# Patient Record
Sex: Male | Born: 1945 | Race: Black or African American | Hispanic: No | Smoking: Former smoker
Health system: Southern US, Community
[De-identification: ages and names within clinical notes are randomized; demographics above are authoritative.]

## PROBLEM LIST (undated history)

## (undated) DIAGNOSIS — J449 Chronic obstructive pulmonary disease, unspecified: Secondary | ICD-10-CM

## (undated) DIAGNOSIS — C61 Malignant neoplasm of prostate: Secondary | ICD-10-CM

## (undated) DIAGNOSIS — I509 Heart failure, unspecified: Secondary | ICD-10-CM

## (undated) DIAGNOSIS — N200 Calculus of kidney: Secondary | ICD-10-CM

## (undated) DIAGNOSIS — F32A Depression, unspecified: Secondary | ICD-10-CM

## (undated) DIAGNOSIS — I639 Cerebral infarction, unspecified: Secondary | ICD-10-CM

## (undated) DIAGNOSIS — R51 Headache: Secondary | ICD-10-CM

## (undated) DIAGNOSIS — C3490 Malignant neoplasm of unspecified part of unspecified bronchus or lung: Secondary | ICD-10-CM

## (undated) DIAGNOSIS — H409 Unspecified glaucoma: Secondary | ICD-10-CM

## (undated) DIAGNOSIS — F419 Anxiety disorder, unspecified: Secondary | ICD-10-CM

## (undated) DIAGNOSIS — F1020 Alcohol dependence, uncomplicated: Secondary | ICD-10-CM

## (undated) DIAGNOSIS — F329 Major depressive disorder, single episode, unspecified: Secondary | ICD-10-CM

## (undated) DIAGNOSIS — Z87442 Personal history of urinary calculi: Secondary | ICD-10-CM

## (undated) DIAGNOSIS — G8929 Other chronic pain: Secondary | ICD-10-CM

## (undated) DIAGNOSIS — Z972 Presence of dental prosthetic device (complete) (partial): Secondary | ICD-10-CM

## (undated) DIAGNOSIS — M1990 Unspecified osteoarthritis, unspecified site: Secondary | ICD-10-CM

## (undated) DIAGNOSIS — I10 Essential (primary) hypertension: Secondary | ICD-10-CM

## (undated) DIAGNOSIS — K219 Gastro-esophageal reflux disease without esophagitis: Secondary | ICD-10-CM

## (undated) DIAGNOSIS — J189 Pneumonia, unspecified organism: Secondary | ICD-10-CM

## (undated) DIAGNOSIS — Z973 Presence of spectacles and contact lenses: Secondary | ICD-10-CM

## (undated) HISTORY — DX: Calculus of kidney: N20.0

## (undated) HISTORY — DX: Major depressive disorder, single episode, unspecified: F32.9

## (undated) HISTORY — DX: Anxiety disorder, unspecified: F41.9

## (undated) HISTORY — DX: Depression, unspecified: F32.A

## (undated) HISTORY — DX: Unspecified glaucoma: H40.9

## (undated) HISTORY — DX: Chronic obstructive pulmonary disease, unspecified: J44.9

## (undated) HISTORY — DX: Alcohol dependence, uncomplicated: F10.20

## (undated) HISTORY — DX: Pneumonia, unspecified organism: J18.9

## (undated) HISTORY — DX: Other chronic pain: G89.29

## (undated) HISTORY — PX: LUMBAR DISC SURGERY: SHX700

## (undated) HISTORY — PX: CERVICAL DISC SURGERY: SHX588

## (undated) HISTORY — DX: Heart failure, unspecified: I50.9

## (undated) HISTORY — DX: Headache: R51

## (undated) SURGERY — COLONOSCOPY WITH PROPOFOL
Anesthesia: Monitor Anesthesia Care

---

## 1998-12-01 ENCOUNTER — Encounter: Payer: Self-pay | Admitting: Emergency Medicine

## 1998-12-01 ENCOUNTER — Emergency Department (HOSPITAL_COMMUNITY): Admission: EM | Admit: 1998-12-01 | Discharge: 1998-12-01 | Payer: Self-pay | Admitting: Emergency Medicine

## 2001-11-08 ENCOUNTER — Encounter: Payer: Self-pay | Admitting: Emergency Medicine

## 2001-11-08 ENCOUNTER — Emergency Department (HOSPITAL_COMMUNITY): Admission: EM | Admit: 2001-11-08 | Discharge: 2001-11-08 | Payer: Self-pay | Admitting: Emergency Medicine

## 2001-11-09 ENCOUNTER — Encounter: Payer: Self-pay | Admitting: Emergency Medicine

## 2002-10-02 ENCOUNTER — Emergency Department (HOSPITAL_COMMUNITY): Admission: EM | Admit: 2002-10-02 | Discharge: 2002-10-02 | Payer: Self-pay | Admitting: Emergency Medicine

## 2003-08-21 ENCOUNTER — Emergency Department (HOSPITAL_COMMUNITY): Admission: EM | Admit: 2003-08-21 | Discharge: 2003-08-22 | Payer: Self-pay | Admitting: Emergency Medicine

## 2003-09-01 ENCOUNTER — Ambulatory Visit (HOSPITAL_COMMUNITY): Admission: RE | Admit: 2003-09-01 | Discharge: 2003-09-01 | Payer: Self-pay | Admitting: Urology

## 2004-03-11 ENCOUNTER — Emergency Department (HOSPITAL_COMMUNITY): Admission: EM | Admit: 2004-03-11 | Discharge: 2004-03-12 | Payer: Self-pay | Admitting: *Deleted

## 2004-03-24 ENCOUNTER — Encounter: Admission: RE | Admit: 2004-03-24 | Discharge: 2004-03-24 | Payer: Self-pay | Admitting: Orthopaedic Surgery

## 2004-04-11 ENCOUNTER — Encounter: Admission: RE | Admit: 2004-04-11 | Discharge: 2004-04-11 | Payer: Self-pay | Admitting: Orthopaedic Surgery

## 2004-04-18 ENCOUNTER — Ambulatory Visit (HOSPITAL_COMMUNITY): Admission: RE | Admit: 2004-04-18 | Discharge: 2004-04-19 | Payer: Self-pay | Admitting: Orthopaedic Surgery

## 2004-10-24 ENCOUNTER — Ambulatory Visit (HOSPITAL_COMMUNITY): Admission: RE | Admit: 2004-10-24 | Discharge: 2004-10-25 | Payer: Self-pay | Admitting: Orthopaedic Surgery

## 2006-01-18 ENCOUNTER — Emergency Department (HOSPITAL_COMMUNITY): Admission: EM | Admit: 2006-01-18 | Discharge: 2006-01-18 | Payer: Self-pay | Admitting: Emergency Medicine

## 2006-01-28 ENCOUNTER — Emergency Department (HOSPITAL_COMMUNITY): Admission: EM | Admit: 2006-01-28 | Discharge: 2006-01-28 | Payer: Self-pay | Admitting: Emergency Medicine

## 2007-06-08 ENCOUNTER — Emergency Department (HOSPITAL_COMMUNITY): Admission: EM | Admit: 2007-06-08 | Discharge: 2007-06-08 | Payer: Self-pay | Admitting: Emergency Medicine

## 2007-12-14 ENCOUNTER — Emergency Department (HOSPITAL_COMMUNITY): Admission: EM | Admit: 2007-12-14 | Discharge: 2007-12-14 | Payer: Self-pay | Admitting: Emergency Medicine

## 2010-05-23 ENCOUNTER — Inpatient Hospital Stay (HOSPITAL_COMMUNITY)
Admission: EM | Admit: 2010-05-23 | Discharge: 2010-05-26 | DRG: 065 | Disposition: A | Discharge status: Home / Self Care | Payer: Medicare Other | Attending: Internal Medicine | Admitting: Internal Medicine

## 2010-05-23 ENCOUNTER — Emergency Department (HOSPITAL_COMMUNITY): Payer: Medicare Other

## 2010-05-23 DIAGNOSIS — F319 Bipolar disorder, unspecified: Secondary | ICD-10-CM | POA: Diagnosis present

## 2010-05-23 DIAGNOSIS — R079 Chest pain, unspecified: Secondary | ICD-10-CM | POA: Diagnosis present

## 2010-05-23 DIAGNOSIS — N179 Acute kidney failure, unspecified: Secondary | ICD-10-CM | POA: Diagnosis present

## 2010-05-23 DIAGNOSIS — I6350 Cerebral infarction due to unspecified occlusion or stenosis of unspecified cerebral artery: Principal | ICD-10-CM | POA: Diagnosis present

## 2010-05-23 DIAGNOSIS — F1010 Alcohol abuse, uncomplicated: Secondary | ICD-10-CM | POA: Diagnosis present

## 2010-05-23 DIAGNOSIS — F17200 Nicotine dependence, unspecified, uncomplicated: Secondary | ICD-10-CM | POA: Diagnosis present

## 2010-05-23 DIAGNOSIS — N390 Urinary tract infection, site not specified: Secondary | ICD-10-CM | POA: Diagnosis present

## 2010-05-23 DIAGNOSIS — I10 Essential (primary) hypertension: Secondary | ICD-10-CM | POA: Diagnosis present

## 2010-05-23 LAB — DIFFERENTIAL
Eosinophils Absolute: 0.2 10*3/uL (ref 0.0–0.7)
Eosinophils Relative: 3 % (ref 0–5)
Lymphocytes Relative: 35 % (ref 12–46)
Monocytes Relative: 5 % (ref 3–12)
Neutro Abs: 4.5 10*3/uL (ref 1.7–7.7)
Neutrophils Relative %: 56 % (ref 43–77)

## 2010-05-23 LAB — URINE MICROSCOPIC-ADD ON

## 2010-05-23 LAB — COMPREHENSIVE METABOLIC PANEL
ALT: 21 U/L (ref 0–53)
CO2: 26 mEq/L (ref 19–32)
Calcium: 9.6 mg/dL (ref 8.4–10.5)
Chloride: 100 mEq/L (ref 96–112)
Total Bilirubin: 0.5 mg/dL (ref 0.3–1.2)

## 2010-05-23 LAB — URINALYSIS, ROUTINE W REFLEX MICROSCOPIC
Hgb urine dipstick: NEGATIVE
Ketones, ur: NEGATIVE mg/dL
Nitrite: NEGATIVE
Protein, ur: NEGATIVE mg/dL
Specific Gravity, Urine: 1.016 (ref 1.005–1.030)
pH: 6 (ref 5.0–8.0)

## 2010-05-23 LAB — CBC
HCT: 41.1 % (ref 39.0–52.0)
MCH: 32.1 pg (ref 26.0–34.0)
MCV: 92.2 fL (ref 78.0–100.0)
RDW: 13 % (ref 11.5–15.5)
WBC: 8 10*3/uL (ref 4.0–10.5)

## 2010-05-24 ENCOUNTER — Inpatient Hospital Stay (HOSPITAL_COMMUNITY): Payer: Medicare Other

## 2010-05-24 DIAGNOSIS — I6789 Other cerebrovascular disease: Secondary | ICD-10-CM

## 2010-05-24 LAB — TROPONIN I: Troponin I: <0.3 ng/mL (ref <0.30)

## 2010-05-24 LAB — COMPREHENSIVE METABOLIC PANEL
ALT: 18 U/L (ref 0–53)
CO2: 25 mEq/L (ref 19–32)
Calcium: 9.6 mg/dL (ref 8.4–10.5)
Chloride: 102 mEq/L (ref 96–112)
Creatinine, Ser: 1.04 mg/dL (ref 0.4–1.5)
GFR calc non Af Amer: >60 mL/min (ref >60)
Glucose, Bld: 98 mg/dL (ref 70–99)
Sodium: 137 mEq/L (ref 135–145)
Total Bilirubin: 0.4 mg/dL (ref 0.3–1.2)

## 2010-05-24 LAB — CBC
HCT: 40.4 % (ref 39.0–52.0)
Hemoglobin: 13.6 g/dL (ref 13.0–17.0)
MCHC: 33.7 g/dL (ref 30.0–36.0)
MCV: 91.8 fL (ref 78.0–100.0)
RDW: 12.9 % (ref 11.5–15.5)

## 2010-05-24 LAB — CARDIAC PANEL(CRET KIN+CKTOT+MB+TROPI)
CK, MB: 2.8 ng/mL (ref 0.3–4.0)
Relative Index: 1.4 (ref 0.0–2.5)
Total CK: 195 U/L (ref 7–232)
Troponin I: <0.3 ng/mL (ref <0.30)

## 2010-05-24 LAB — PROTIME-INR: INR: 0.95 (ref 0.00–1.49)

## 2010-05-24 LAB — HEMOGLOBIN A1C: Hgb A1c MFr Bld: 5.1 % (ref <5.7)

## 2010-05-24 LAB — RAPID URINE DRUG SCREEN, HOSP PERFORMED
Cocaine: NOT DETECTED
Opiates: NOT DETECTED

## 2010-05-24 LAB — LIPID PANEL
HDL: 38 mg/dL — ABNORMAL LOW (ref >39)
LDL Cholesterol: 53 mg/dL (ref 0–99)

## 2010-05-24 LAB — CK TOTAL AND CKMB (NOT AT ARMC): Total CK: 224 U/L (ref 7–232)

## 2010-05-24 LAB — APTT: aPTT: 34 seconds (ref 24–37)

## 2010-05-24 LAB — MAGNESIUM: Magnesium: 2.1 mg/dL (ref 1.5–2.5)
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
## 2010-05-25 ENCOUNTER — Inpatient Hospital Stay (HOSPITAL_COMMUNITY): Payer: Medicare Other

## 2010-05-25 DIAGNOSIS — I6330 Cerebral infarction due to thrombosis of unspecified cerebral artery: Secondary | ICD-10-CM

## 2010-05-25 NOTE — Op Note (Signed)
NAMETALIESIN, HARTLAGE NO.:  [id number]   MEDICAL RECORD NO.:  [id number]          PATIENT TYPE:  AMB   LOCATION:  SDS                          FACILITY:  [facility]   PHYSICIAN:  Mark C. Ophelia Charter, M.D.    DATE OF BIRTH:  10/10/1945   DATE OF PROCEDURE:  DATE OF DISCHARGE:                                 OPERATIVE REPORT   Audio too short to transcribe (less than 5 seconds)      Mark C. Ophelia Charter, M.D.     MCY/MEDQ  D:  10/24/2004  T:  10/24/2004  Job:  [id number]

## 2010-05-25 NOTE — Op Note (Signed)
NAMEXZAVIAN, SEMMEL NO.:  [id number]   MEDICAL RECORD NO.:  [id number]          PATIENT TYPE:  AMB   LOCATION:  SDS                          FACILITY:  [facility]   PHYSICIAN:  Mark C. Ophelia Charter, M.D.    DATE OF BIRTH:  1945-09-02   DATE OF PROCEDURE:  10/24/2004  DATE OF DISCHARGE:                                 OPERATIVE REPORT   PREOPERATIVE DIAGNOSIS:  Multilevel cervical spondylosis, spinal stenosis C4-  5, C5-6, C6-7.   POSTOPERATIVE DIAGNOSIS:  Multilevel cervical spondylosis, spinal stenosis  C4-5, C5-6, C6-7.   PROCEDURE:  C4-5, C5-6, and C6-7 anterior cervical discectomy and allograft  plating and fusion.   SURGEON:  Mark C. Ophelia Charter, M.D.   ANESTHESIA:  GOT plus Marcaine local.   ESTIMATED BLOOD LOSS:  Less than 100 mL.   DESCRIPTION OF PROCEDURE:  After induction of general anesthesia with oral  endotracheal intubation with head halter traction with 5 pounds with  horseshoe head holder; neck was prepped with DuraPrep, the area with sterile  towels.  A transverse incision was planned and a long incision was made  starting 1 cm across the midline extending to the left along a prominent  skin fold.  A laminectomy __________ Betadine and Vi-Drapes were applied.   Incision was made in the platysma, divided.  There was significant  mobilization underneath the platysma for exposure.  Once this was  accomplished continued blunt dissection down the longus coli muscles was  made.  The large spurts at C4-5, C5-6, and C6-7 were identified.  Self-  retaining retractors were placed to keep the retractors under the longus  coli.  The C4-5 level was addressed first which had the largest chunks of  disk with significant stenosis.  A power bur was used to remove the anterior  spurs.  Disk space was cleaned out with the 15-scalpel blade,  micropitiuitaries, and Cloward curettes.  Operative microscope was brought  in and its power bur was used drilling back to the  posterior cortex,  removing the spurs.  The spurs were removed with 1 and 2-mm Kerrison's.  There was significant overlap of the posterior osteophytes from cephalad to  caudad.  Once these were removed and posterior longitudinal ligament was  taken down the dura was visualized.  The patient was sized and a 7-mm  allograft was selected, placed, countersunk, and checked under fluoroscopy.   Next, an identical procedure was repeated at the next level, C5-6 with  moving the __________ Cloward retractors on the longus coli, drilling back  to the posterior cortex.  Cortex was thinned.  It was taken down by hand.  There was a question of whether the PM might have been violated.  Irrigation  was being used throughout the case with the power bur and the operative  field was dried and the patient was taken up to 35 cm of water.  No spinal  fluid was visualized.  No dural leak was noted.  The rest of the spurs were  removed.  Dura appeared intact. A small amount of FloSeal was squirted; 0.5  mL sizing was preceded; checked for spinal fluid, again, and there was none  seen; and a 6-0 graft was placed.  An identical procedure was then repeated  at the C6-7 level removing the large spurs.  There was a 2 mm disk space  which had to be cleaned out; and, once the space was opened up spurs were  removed, microscope was moved down, and an identical procedure was  performed.  The graft was inserted after taking down the posterior  longitudinal ligament, removing the big chunks of disk material that was  retained by the ligament.  There were no free fragments present.  With the  third graft placed.  The plate was selected, a loose Synthes plate [id number].  It was checked under fluoroscopy for appropriate size, checked AP and  lateral and then the screw holes were drilled, filled, and then checked  under fluoroscopy.  The distal aspect had to be oblique in order to see, but  it was good position, parallel screws  and the plate had been precontoured.  There was good stability after irrigation, Hemovac was placed.  There was  egress on each side of the allograft that had DBX placed in the middle of  each fibular allograft ring. The platysma was closed with 3-0 Vicryl, 4-0  Vicryl, subcuticular skin closure, tincture of benzoin, Steri-Strips, 4 x 4  collar, and transferred to recovery in stable condition.      Mark C. Ophelia Charter, M.D.  Electronically Signed     MCY/MEDQ  D:  10/24/2004  T:  10/25/2004  Job:  [id number]

## 2010-05-25 NOTE — Op Note (Signed)
NAMEABYAN, CADMAN NO.:  [id number]   MEDICAL RECORD NO.:  [id number]          PATIENT TYPE:  OIB   LOCATION:  5013                         FACILITY:  [facility]   PHYSICIAN:  Mark C. Ophelia Charter, M.D.    DATE OF BIRTH:  [birth year]   DATE OF PROCEDURE:  04/18/2004  DATE OF DISCHARGE:                                 OPERATIVE REPORT   PREOPERATIVE DIAGNOSIS:  Right L3-4 herniated nucleus pulposus with large  free fragment and spinal stenosis.   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   OPERATION/PROCEDURE:  Right L3 hemilaminectomy, foraminotomy,  microdiskectomy, and removal of large free fragment.  The operative level  was L3-4 as labeled by the radiologist based on his interpretation of  possible sacralization on a lateral x-ray.  This would normally be labeled  L2-3, since it was two levels above the sclerotic collapse, L4-5 level which  was exactly even with the iliac crest.   SURGEON:  Mark C. Ophelia Charter, M.D.   ANESTHESIA:  General orotracheal anesthesia 100 mL.   INDICATIONS:  The patient had preoperative MRI for which she was unable to  hold still which showed a large free fragment and on myelogram CT scan there  was near total myelographic block from the large fragment plus preexisting  stenosis.  The patient has had multiple procedures including exploration at  this level.   DESCRIPTION OF PROCEDURE:  After induction of general anesthesia, back was  prepped after placing the patient on the Andrews frame with careful padding  and positioning in the kneeling position.  He was prepped with Hibiclens  instead of DuraPrep due to his shellfish allergy.  The area was  ____squared______  with towels. Vi-Drape was applied.  Laminectomy sheet.  Localization with the needle showed it was just above the operative level  which was labeled by the radiologist as L3-4.  Incision was made starting  slow  with thinning distally in the old incision.  Subcutaneous tissue was  dissected with the Bovie electrocautery.  Taylor retractor was placed  laterally.  Laminotomy was performed and this was enlarged proximally as  well as distally, dissecting through the old scar tissue from the previous  laminectomy at the level below.  Nerve root was extremely irritated.  There  was foraminal disk component and considerable amount of time was taken in  order to free up laterally enough to get around the dura removing large  chunks of ligament and hemilamina was required on the right.  Finally,  proximally the edge of the dura was separated, falling down across the base  until the disk was encountered.  There was large chunks of disk which  started at the disk and then migrated caudally.  As soon as the annulus was  incised __________  ligament, immediately the large chunks of disk were  teased out.  Gradually piece by piece using a ball-tip nerve hook was pulled  into the operative field, grabbed and removed.  Continued dissection was  performed enlarging the foramina, falling distally  until the sac was  completely removed and fragments extended about halfway down to the next  disk space.  Once the disc was completely cleared out, hockey stick could be  placed through a 180-degree sweep anterior to the dura __________ .  Passes  were made to the disk space, removing additional chunks of disk. Some of the  pieces that were included in the free fragment had pieces of end plate  attached to them.  With the nerve root and  foramina completely decompressed and portions of the foraminal disk  component removed.  The operative area was irrigated copiously with saline  solution.  Fascia closed with 3-0 Vicryl, 2-0 Vicryl for the subcutaneous  tissue.  Dura was intact.  Skin staple closure.  Marcaine infiltration.  Instrument count and needle count were correct.      MCY/MEDQ  D:  04/18/2004  T:  04/19/2004   Job:  [id number]

## 2010-05-26 LAB — URINE CULTURE
Colony Count: NO GROWTH
Culture  Setup Time: 201205180900
Culture: NO GROWTH
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
## 2010-08-03 NOTE — Discharge Summary (Signed)
[name], [name] NO.:  [id number]  MEDICAL RECORD NO.:  [id number]           PATIENT TYPE:  I  LOCATION:  3006                         FACILITY:  [facility]  PHYSICIAN:  Baltazar Najjar, MD     DATE OF BIRTH:  [birth year]  DATE OF ADMISSION:  05/23/2010 DATE OF DISCHARGE:                              DISCHARGE SUMMARY   FINAL DISCHARGE DIAGNOSES: 1. Acute tiny internal capsule stroke. 2. 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 3. Transient dizziness and vision changes, thought to be secondary to     accelerated hypertension. 4. Uncontrolled hypertension. 5. Pyuria. 6. Tobacco abuse. 7. Alcohol abuse.  SECONDARY DISCHARGE DIAGNOSIS:  History of bipolar disorder.  CONSULTATIONS: 1. Neurology Service. 2. Tobacco Cessation consult.  RADIOLOGY/IMAGING: 1. Chest x-ray showed mild bronchitic and emphysematous changes with     minimal right basilar atelectasis. 2. Brain MRI showed a questionable tiny acute infarct in the     posterosuperior aspect of the posterior limb of the left internal     capsule.  White matter-type changes most notable and confluent left     periatrial region, probably related to the result of small vessel     disease.  No intracranial hemorrhage, no intracranial mass lesion.     Polypoid opacification, left maxillary sinus. 3. MRA head showed 2-mm aneurysm, medial aspect cavernous segment of     the right internal carotid artery.  Intracranial atherosclerotic-     type changes most notable involving branch vessel as detailed     above. 4. CT of the head was negative.  BRIEF ADMITTING HISTORY:  Please refer to H and P for more details.  On summary, [name] is a 65 year old African American man with history of hypertension and ongoing tobacco and alcohol abuse, presented to the hospital with chief complaint of blurring of vision and difficulty walking.  Please refer to H and P for more details.  HOSPITAL COURSE: 1.  The patient has a reported systolic blood pressure of 200 at home.     In the ED, his blood pressure was 125/75 as per H and P, I am not     sure if he received medicine at that time.  The patient had workup     including CT of the head which was unremarkable; however, given his     symptoms, he was admitted to rule out CVA.  He arrived to the ER     beyond the window time for tPA, so he did not receive any tPA since     he was not a candidate for it.  His MRI showed suspected tiny     internal capsule stroke.  Neurology Service was consulted who     thinks it is unlikely stroke.  However, they recommended Plavix 75     mg daily since the patient is allergic to ASPIRIN.  The rest of his     stroke workup was done including carotid Dopplers and 2-D echo.     His carotid Dopplers showed no ICA stenosis.  A 2-D echocardiogram  showed no obvious cardiac source of embolus.  The patient was     evaluated by PT/OT and ST.  Initially, CIR consult was recommended     and CIR was consulted; however, the patient expressed his wishes to     go home and CIR evaluation thought he can be appropriate for     outpatient PT.  So, subsequently, I wrote an order for outpatient     PT, also he was seen by Speech Therapy and outpatient speech     therapy was recommended.  The patient given an order for outpatient     PT and ST.  Internal carotid artery 2-mm aneurysm on MRA that was     discussed with Dr. Pearlean Brownie from Neurology Service who stated that     there is no need for any repeat imaging or any other further workup     and that can be treated conservatively.  The patient to follow with     Dr. Pearlean Brownie in the office for any further recommendations regarding     this aneurysm.  The patient is currently asymptomatic. 2. Pyuria.  His urinalysis showed 3-6 wbc's.  The patient had no     symptoms, no leukocytosis or fever.  However, he empirically     received ceftriaxone, today day #3.  His urine culture was  sent and     still pending at the time of dictation.  I will hold off any     further antibiotic more than the 3 days he already received and     urine culture to be followed up on an outpatient basis by his PCP.     I personally do not believe he had UTI, however, cultures are still     pending at the time of dictation. 3. Hypertension.  The patient was on hydrochlorothiazide on an     outpatient basis.  His blood pressure seems to be uncontrolled as     per history.  I started him on Norvasc and I will discharge him on     Norvasc.  I will discontinue his hydrochlorothiazide given the fact     that the patient's potassium is running low and also his creatinine     was slightly elevated on presentation and I think calcium channel     blocker will be the best option for him given his CVA. 4. Tobacco abuse/alcohol abuse.  The patient was placed on CIWA     protocol during this hospitalization.  However, as per nurse, he     did not really require any of the Ativan and he was seemed to be     calm.  He was seen by Tobacco Cessation consultation in the     hospital and counseled on quitting smoking and he was also provided     with resources for outpatient followup.  I will also make sure that     she will receive some resources for EtOH rehab program as an     outpatient by social worker before discharge.  DISCHARGE MEDICATIONS: 1. Amlodipine 5 mg p.o. daily. 2. Plavix 75 mg p.o. daily. 3. Folic acid 1 mg p.o. daily. 4. Multivitamin 1 capsule p.o. daily. 5. Thiamine 100 mg p.o. daily. 6. Hydroxyzine 25 mg capsule 1 capsule q.4 h. p.r.n. 7. Naltrexone 50 mg half a tablet p.o. daily. 8. Naproxen 500 mg p.o. twice daily. 9. Omeprazole 20 mg p.o. daily. 10.Sertraline 100 mg 2 tablets p.o. daily. 11.Sildenafil 50 mg  half a tablet by mouth daily as needed.  DISCHARGE INSTRUCTIONS: 1. The patient to continue with his PCP, preferably within 1 week of     discharge and to follow with  Neurology within 1 month of discharge. 2. The patient to report any worsening of symptoms or any new symptoms     to his PCP. 3. The patient advised to check his blood pressure daily and keep a     log book for his readings and discuss that with his PCP for any     further adjustment of his antihypertensive regimen.  LABORATORY DATA:  Urine culture pending at the time of dictation.  I will defer followup to his PCP.  The patient received 3 days of antibiotic in the hospital and he is asymptomatic.  CONDITION ON DISCHARGE:  Improved.          ______________________________ Baltazar Najjar, MD     SA/MEDQ  D:  05/26/2010  T:  05/26/2010  Job:  [id number]  cc:   Dr. Cecille Amsterdam  Electronically Signed by Hannah Beat MD on 08/03/2010 03:00:17 PM

## 2010-09-03 ENCOUNTER — Emergency Department (HOSPITAL_COMMUNITY)
Admission: EM | Admit: 2010-09-03 | Discharge: 2010-09-03 | Disposition: A | Discharge status: Home / Self Care | Payer: Medicare Other | Attending: Emergency Medicine | Admitting: Emergency Medicine

## 2010-09-03 DIAGNOSIS — I10 Essential (primary) hypertension: Secondary | ICD-10-CM | POA: Insufficient documentation

## 2010-09-03 DIAGNOSIS — L299 Pruritus, unspecified: Secondary | ICD-10-CM | POA: Insufficient documentation

## 2010-09-03 DIAGNOSIS — R21 Rash and other nonspecific skin eruption: Secondary | ICD-10-CM | POA: Insufficient documentation

## 2010-09-03 DIAGNOSIS — K219 Gastro-esophageal reflux disease without esophagitis: Secondary | ICD-10-CM | POA: Insufficient documentation

## 2010-09-04 LAB — GC/CHLAMYDIA PROBE AMP, GENITAL: Chlamydia, DNA Probe: NEGATIVE

## 2010-09-18 ENCOUNTER — Emergency Department (HOSPITAL_COMMUNITY)
Admission: EM | Admit: 2010-09-18 | Discharge: 2010-09-18 | Disposition: A | Discharge status: Home / Self Care | Payer: Medicare Other | Attending: Emergency Medicine | Admitting: Emergency Medicine

## 2010-09-18 DIAGNOSIS — K219 Gastro-esophageal reflux disease without esophagitis: Secondary | ICD-10-CM | POA: Insufficient documentation

## 2010-09-18 DIAGNOSIS — R220 Localized swelling, mass and lump, head: Secondary | ICD-10-CM | POA: Insufficient documentation

## 2010-09-18 DIAGNOSIS — T783XXA Angioneurotic edema, initial encounter: Secondary | ICD-10-CM | POA: Insufficient documentation

## 2010-09-18 DIAGNOSIS — I10 Essential (primary) hypertension: Secondary | ICD-10-CM | POA: Insufficient documentation

## 2010-09-18 DIAGNOSIS — R221 Localized swelling, mass and lump, neck: Secondary | ICD-10-CM | POA: Insufficient documentation

## 2010-09-18 DIAGNOSIS — X58XXXA Exposure to other specified factors, initial encounter: Secondary | ICD-10-CM | POA: Insufficient documentation

## 2010-10-04 LAB — STREP A DNA PROBE: Group A Strep Probe: POSITIVE

## 2011-05-21 ENCOUNTER — Emergency Department (HOSPITAL_COMMUNITY): Payer: Medicare Other

## 2011-05-21 ENCOUNTER — Encounter (HOSPITAL_COMMUNITY): Payer: Self-pay | Admitting: *Deleted

## 2011-05-21 ENCOUNTER — Emergency Department (HOSPITAL_COMMUNITY)
Admission: EM | Admit: 2011-05-21 | Discharge: 2011-05-21 | Disposition: A | Discharge status: Home / Self Care | Payer: Medicare Other | Attending: Emergency Medicine | Admitting: Emergency Medicine

## 2011-05-21 DIAGNOSIS — N433 Hydrocele, unspecified: Secondary | ICD-10-CM

## 2011-05-21 DIAGNOSIS — K219 Gastro-esophageal reflux disease without esophagitis: Secondary | ICD-10-CM | POA: Insufficient documentation

## 2011-05-21 DIAGNOSIS — R109 Unspecified abdominal pain: Secondary | ICD-10-CM | POA: Insufficient documentation

## 2011-05-21 DIAGNOSIS — I861 Scrotal varices: Secondary | ICD-10-CM

## 2011-05-21 DIAGNOSIS — I10 Essential (primary) hypertension: Secondary | ICD-10-CM | POA: Insufficient documentation

## 2011-05-21 DIAGNOSIS — N509 Disorder of male genital organs, unspecified: Secondary | ICD-10-CM | POA: Insufficient documentation

## 2011-05-21 HISTORY — DX: Essential (primary) hypertension: I10

## 2011-05-21 HISTORY — DX: Gastro-esophageal reflux disease without esophagitis: K21.9

## 2011-05-21 NOTE — ED Notes (Signed)
Pt in c/o RLQ pain and right testicle pain and swelling x1 week, history of same but does not know the cause, states last time he was evaluated for an STD but it was negative

## 2011-05-21 NOTE — ED Provider Notes (Signed)
History    Scribed for Jeremy Numbers, MD, the patient was seen in room STRE6/STRE6. This chart was scribed by Katha Cabal.   CSN: [id number]  Arrival date & time 05/21/11  1248   First MD Initiated Contact with Patient 05/21/11 1319      Chief Complaint  Patient presents with  . Abdominal Pain  . Testicle Pain    (Consider location/radiation/quality/duration/timing/severity/associated sxs/prior treatment) HPI Jeremy Numbers, MD entered patient's room at 1:42 PM   Jeremy Landry is a 66 y.o. male who presents to the Emergency Department complaining of intermittent testicular pain for past week.     Pain rated 4/10 currently.  Pain radiates to abdomen. Nothing taken for pain.  Walking and certain movements make pain worse.  Patient was seen in ED previously for test testicular swelling and rash.   Patient had negative STD testing.  Symptoms are not associated with rash, nausea, vomiting, dysuria, diarrhea or constipation.     [facility] Dr[facility] Truman Hayward     Past Medical History  Diagnosis Date  . Hypertension   . GERD (gastroesophageal reflux disease)     History reviewed. No pertinent past surgical history.  History reviewed. No pertinent family history.  History  Substance Use Topics  . Smoking status: Not on file  . Smokeless tobacco: Not on file  . Alcohol Use:       Review of Systems  Constitutional: Negative.   HENT: Negative.   Eyes: Negative.   Respiratory: Negative.   Cardiovascular: Negative.   Gastrointestinal: Negative.   Genitourinary: Positive for scrotal swelling and testicular pain. Negative for dysuria.  Musculoskeletal: Negative.   Skin: Negative.   Neurological: Negative.   Hematological: Negative.   Psychiatric/Behavioral: Negative.   All other systems reviewed and are negative.  Remaining review of systems negative except as noted in the HPI.    Allergies  Aspirin and Shellfish allergy  Home Medications  No current outpatient prescriptions on  file.  BP 130/80  Pulse 79  Temp(Src) 97.3 F (36.3 C) (Oral)  Resp 16  SpO2 91%  Physical Exam  Nursing note and vitals reviewed. Constitutional: He is oriented to person, place, and time. He appears well-developed and well-nourished. No distress.  HENT:  Head: Normocephalic and atraumatic.  Eyes: Conjunctivae and EOM are normal.  Neck: Neck supple. No tracheal deviation present.  Cardiovascular: Normal rate.   Pulmonary/Chest: Effort normal. No respiratory distress.  Abdominal: Soft. There is no tenderness. There is no rebound and no guarding. Hernia confirmed negative in the right inguinal area and confirmed negative in the left inguinal area.  Genitourinary:       2 cm nodular area on right testicle, no epididymal tenderness  Musculoskeletal: Normal range of motion.  Neurological: He is alert and oriented to person, place, and time. Cranial nerve deficit: cranial nerves grossly intact   Skin: Skin is warm and dry.  Psychiatric: He has a normal mood and affect. His behavior is normal.    ED Course  Procedures (including critical care time)   DIAGNOSTIC STUDIES: Oxygen Saturation is 93% on room air, normal by my interpretation.     COORDINATION OF CARE: 1:49 PM  Physical exam complete.  Discussed plan of treatment with patient. Patient agrees with plan.   3:44 PM  Discussed radiological findings with patient.     Orders Placed This Encounter  Procedures  . US Scrotum  . [place] Art/Ven Flow Abd Pelv Doppler     LABS / RADIOLOGY:  Labs Reviewed - No data to display US Scrotum  05/21/2011  *RADIOLOGY REPORT*  Clinical Data:  Right scrotal mass and pain.  SCROTAL ULTRASOUND DOPPLER ULTRASOUND OF THE TESTICLES  Technique: Complete ultrasound examination of the testicles, epididymis, and other scrotal structures was performed.  Color and spectral Doppler ultrasound were also utilized to evaluate blood flow to the testicles.  Comparison:  None  Findings:  Right testis:  4.2  x 1.7 x 2.4 cm.  No evidence of testicular mass or microlithiasis.  Internal blood flow seen on color Doppler ultrasound.  Left testis:  4.3 x 2.2 x 2.3 cm.  The no evidence of testicular mass or microlithiasis.  Internal blood flow seen on color Doppler ultrasound.  Right epididymis:  Diffusely enlarged, with increased internal blood flow seen on color Doppler ultrasound, consistent with epididymitis.  Left epididymis:  Normal in size and appearance.  Hydocele:  Small right hydrocele present.  No left hydrocele identified.  Varicocele:  Small bilateral varicoceles noted.  Pulsed Doppler interrogation of both testes demonstrates low resistance arterial and venous waveform patterns bilaterally.  IMPRESSION:  1.  No evidence of testicular torsion or mass. 2.  Right-sided epididymitis and small right hydrocele. 3.  Small bilateral varicoceles.  Original Report Authenticated By: Danae Orleans, M.D.   [place] Art/ven Flow Abd Pelv Doppler  05/21/2011  *RADIOLOGY REPORT*  Clinical Data:  Right scrotal mass and pain.  SCROTAL ULTRASOUND DOPPLER ULTRASOUND OF THE TESTICLES  Technique: Complete ultrasound examination of the testicles, epididymis, and other scrotal structures was performed.  Color and spectral Doppler ultrasound were also utilized to evaluate blood flow to the testicles.  Comparison:  None  Findings:  Right testis:  4.2 x 1.7 x 2.4 cm.  No evidence of testicular mass or microlithiasis.  Internal blood flow seen on color Doppler ultrasound.  Left testis:  4.3 x 2.2 x 2.3 cm.  The no evidence of testicular mass or microlithiasis.  Internal blood flow seen on color Doppler ultrasound.  Right epididymis:  Diffusely enlarged, with increased internal blood flow seen on color Doppler ultrasound, consistent with epididymitis.  Left epididymis:  Normal in size and appearance.  Hydocele:  Small right hydrocele present.  No left hydrocele identified.  Varicocele:  Small bilateral varicoceles noted.  Pulsed Doppler  interrogation of both testes demonstrates low resistance arterial and venous waveform patterns bilaterally.  IMPRESSION:  1.  No evidence of testicular torsion or mass. 2.  Right-sided epididymitis and small right hydrocele. 3.  Small bilateral varicoceles.  Original Report Authenticated By: Danae Orleans, M.D.         MDM  Patient was evaluated and did appear to have a testicular mass.  He had an ultrasound performed which revealed only hydrocele and bilateral varicoceles.  Patient was counseled on symptoms from these and was comfortable at discharge.  He was given contact information for [facility] if his hydrocele continues to cause him enough discomfort that he decides he might want elective surgery.     MEDICATIONS GIVEN IN THE E.D. Scheduled Meds:   Continuous Infusions:       IMPRESSION: 1. Bilateral varicoceles   2. Right hydrocele      NEW MEDICATIONS: New Prescriptions   No medications on file      I personally performed the services described in this documentation, which was scribed in my presence. The recorded information has been reviewed and considered.         Jeremy Numbers, MD 05/24/11 1248

## 2011-05-21 NOTE — ED Notes (Signed)
Patient transported to Ultrasound 

## 2011-05-21 NOTE — Discharge Instructions (Signed)
Hydrocele, Adult Fluid can collect around the testicles. This fluid forms in a sac. This condition is called a hydrocele. The collected fluid causes swelling of the scrotum. Usually, it affects just one testicle. Most of the time, the condition does not cause pain. Sometimes, the hydrocele goes away on its own. Other times, surgery is needed to get rid of the fluid. CAUSES A hydrocele does not develop often. Different things can cause a hydrocele in a man, including:  Injury to the scrotum.   Infection.   X-ray of the area around the scrotum.   A tumor or cancer of the testicle.   Twisting of a testicle.   Decreased blood flow to the scrotum.  SYMPTOMS   Swelling without pain. The hydrocele feels like a water-filled balloon.   Swelling with pain. This can occur if the hydrocele was caused by infection or twisting.   Mild discomfort in the scrotum.   The hydrocele may feel heavy.   Swelling that gets smaller when you lie down.  DIAGNOSIS  Your caregiver will do a physical exam to decide if you have a hydrocele. This may include:  Asking questions about your overall health, today and in the past. Your caregiver may ask about any injuries, X-rays, or infections.   Pushing on your abdomen or asking you to change positions to see if the size of the hydrocele changes.   Shining a light through the scrotum (transillumination) to see if the fluid inside the scrotum is clear.   Blood tests and urine tests to check for infection.   Imaging studies that take pictures of the scrotum and testicles.  TREATMENT  Treatment depends in part on what caused the condition. Options include:  Watchful waiting. Your caregiver checks the hydrocele every so often.   Different surgeries to drain the fluid.   A needle may be put into the scrotum to drain fluid (needle aspiration). Fluid often returns after this type of treatment.   A cut (incision) may be made in the scrotum to remove the fluid  sac (hydrocelectomy).   An incision may be made in the groin to repair a hydrocele that has contact with abdominal fluids (communicating hydrocele).   Medicines to treat an infection (antibiotics).  HOME CARE INSTRUCTIONS  What you need to do at home may depend on the cause of the hydrocele and type of treatment. In general:  Take all medicine as directed by your caregiver. Follow the directions carefully.   Ask your caregiver if there is anything you should not do while you recover (activities, lifting, work, sex).   If you had surgery to repair a communicating hydrocele, recovery time may vary. Ask you caregiver about your recovery time.   Avoid heavy lifting for 4 to 6 weeks.   If you had an incision on the scrotum or groin, wash it for 2 to 3 days after surgery. Do this as long as the skin is closed and there are no gaps in the wound. Wash gently, and avoid rubbing the incision.   Keep all follow-up appointments.  SEEK MEDICAL CARE IF:   Your scrotum seems to be getting larger.   The area becomes more and more uncomfortable.  SEEK IMMEDIATE MEDICAL CARE IF:  You have a fever. Document Released: 06/13/2009 Document Revised: 12/13/2010 Document Reviewed: 06/13/2009 [facility] Patient Information 2012 [place], [place].Varicocele A varicocele is a swelling of veins in the scrotum (the bag of skin that contains the testicles). It is most common in young men.  It occurs most often on the left side. Small or painless varicoceles do not need treatment. Most often, this is not a serious problem, but further tests may be needed to confirm the diagnosis. Surgery may be needed if complications of varicoceles arise. Rarely, varicoceles can reoccur after surgery. CAUSES  The swelling is due to blood backing up in the vein that leads from the testicle back to the body. Blood backs up because the valves inside the vein are not working properly. Veins normally return blood to the heart. Valves in veins  are supposed to be one-way valves. They should not allow blood to flow backwards. If the valves do not work well, blood can pool in a vein and make it swell. The same thing happens with varicose veins in the leg. SYMPTOMS  A varicocele most often causes no symptoms. When they occur, symptoms include:   Swelling on one side of the scrotum.   Swelling that is more obvious when standing up.   A lumpy feeling in the scrotum.   Heaviness on one side of the scrotum.   Dull ache in the scrotum, especially after exercise or prolonged standing or sitting.   Slower growth or reduced size of the testicle on the side of the varicocele (in young males).   Problems with fertility can arise if the testicle does not grow normally.  DIAGNOSIS  Varicocele is usually diagnosed by a physical exam. Sometimes ultrasonography is done. TREATMENT  Usually, varicoceles need no treatment. They are often routinely monitored on exam by your caregiver to ensure they do not slow the growth of the testicle on that side. Treatment may be needed if:  The varicocele is large.   There is a lot of pain.   The varicocele causes a decrease in the size of the testicle in a growing adolescent.   The other testicle is absent or not normal.   Varicoceles are found on both sides of the scrotum.   There is pain when exercising.   There are fertility problems.  There are two types of treatment:  Surgery. The surgeon ties off the swollen veins. Surgery may be done with an incision in the skin or through a laparoscope. The surgery is usually done in an outpatient setting. Outpatient means there is no overnight stay in a hospital.   Embolization. A small tube is placed in a vein and guided into the swollen veins. X-rays are used to guide the small tube. Tiny metal coils or other blocking items are put through the tube. This blocks swollen veins and the flow of blood. This is usually done in an outpatient setting without the  use of general anesthesia.  HOME CARE INSTRUCTIONS  To decrease discomfort:  Wear supportive underwear.   Use an athletic supporter for sports.   Only take over-the-counter or prescription medicines for pain or discomfort as directed by your caregiver.  SEEK MEDICAL CARE IF:   Pain is increasing.   Swelling does not decrease when lying down.   Testicle is smaller.   The testicle becomes enlarged, swollen, red, or painful.  Document Released: 04/01/2000 Document Revised: 12/13/2010 Document Reviewed: 04/05/2009 [facility] Patient Information 2012 [place], [place].

## 2014-08-11 ENCOUNTER — Emergency Department (HOSPITAL_COMMUNITY): Payer: Medicare HMO

## 2014-08-11 ENCOUNTER — Encounter (HOSPITAL_COMMUNITY): Payer: Self-pay | Admitting: *Deleted

## 2014-08-11 ENCOUNTER — Inpatient Hospital Stay (HOSPITAL_COMMUNITY)
Admission: EM | Admit: 2014-08-11 | Discharge: 2014-08-17 | DRG: 098 | Disposition: A | Discharge status: Skilled Nursing Facility | Payer: Medicare HMO | Attending: Family Medicine | Admitting: Family Medicine

## 2014-08-11 DIAGNOSIS — R29898 Other symptoms and signs involving the musculoskeletal system: Secondary | ICD-10-CM

## 2014-08-11 DIAGNOSIS — I719 Aortic aneurysm of unspecified site, without rupture: Secondary | ICD-10-CM

## 2014-08-11 DIAGNOSIS — G373 Acute transverse myelitis in demyelinating disease of central nervous system: Principal | ICD-10-CM | POA: Diagnosis present

## 2014-08-11 DIAGNOSIS — G459 Transient cerebral ischemic attack, unspecified: Secondary | ICD-10-CM

## 2014-08-11 DIAGNOSIS — N400 Enlarged prostate without lower urinary tract symptoms: Secondary | ICD-10-CM | POA: Diagnosis present

## 2014-08-11 DIAGNOSIS — Z8546 Personal history of malignant neoplasm of prostate: Secondary | ICD-10-CM

## 2014-08-11 DIAGNOSIS — Z720 Tobacco use: Secondary | ICD-10-CM | POA: Diagnosis not present

## 2014-08-11 DIAGNOSIS — J9611 Chronic respiratory failure with hypoxia: Secondary | ICD-10-CM | POA: Diagnosis present

## 2014-08-11 DIAGNOSIS — J449 Chronic obstructive pulmonary disease, unspecified: Secondary | ICD-10-CM | POA: Diagnosis present

## 2014-08-11 DIAGNOSIS — F17200 Nicotine dependence, unspecified, uncomplicated: Secondary | ICD-10-CM | POA: Diagnosis present

## 2014-08-11 DIAGNOSIS — K219 Gastro-esophageal reflux disease without esophagitis: Secondary | ICD-10-CM

## 2014-08-11 DIAGNOSIS — R531 Weakness: Secondary | ICD-10-CM

## 2014-08-11 DIAGNOSIS — F17210 Nicotine dependence, cigarettes, uncomplicated: Secondary | ICD-10-CM | POA: Diagnosis present

## 2014-08-11 DIAGNOSIS — R200 Anesthesia of skin: Secondary | ICD-10-CM | POA: Diagnosis present

## 2014-08-11 DIAGNOSIS — R202 Paresthesia of skin: Secondary | ICD-10-CM | POA: Diagnosis not present

## 2014-08-11 DIAGNOSIS — Z79899 Other long term (current) drug therapy: Secondary | ICD-10-CM

## 2014-08-11 DIAGNOSIS — I10 Essential (primary) hypertension: Secondary | ICD-10-CM | POA: Diagnosis present

## 2014-08-11 HISTORY — DX: Malignant neoplasm of prostate: C61

## 2014-08-11 LAB — CBC WITH DIFFERENTIAL/PLATELET
BASOS PCT: 0 % (ref 0–1)
Basophils Absolute: 0 10*3/uL (ref 0.0–0.1)
EOS PCT: 3 % (ref 0–5)
Eosinophils Absolute: 0.2 10*3/uL (ref 0.0–0.7)
HCT: 36.1 % — ABNORMAL LOW (ref 39.0–52.0)
Hemoglobin: 11.9 g/dL — ABNORMAL LOW (ref 13.0–17.0)
LYMPHS PCT: 30 % (ref 12–46)
Lymphs Abs: 1.6 10*3/uL (ref 0.7–4.0)
MCH: 30.7 pg (ref 26.0–34.0)
MCHC: 33 g/dL (ref 30.0–36.0)
MCV: 93.3 fL (ref 78.0–100.0)
MONO ABS: 0.3 10*3/uL (ref 0.1–1.0)
Monocytes Relative: 6 % (ref 3–12)
Neutro Abs: 3.2 10*3/uL (ref 1.7–7.7)
Neutrophils Relative %: 61 % (ref 43–77)
Platelets: 240 10*3/uL (ref 150–400)
RBC: 3.87 MIL/uL — ABNORMAL LOW (ref 4.22–5.81)
RDW: 13 % (ref 11.5–15.5)
WBC: 5.3 10*3/uL (ref 4.0–10.5)

## 2014-08-11 LAB — BASIC METABOLIC PANEL
Anion gap: 7 (ref 5–15)
BUN: 13 mg/dL (ref 6–20)
CO2: 26 mmol/L (ref 22–32)
CREATININE: 1.04 mg/dL (ref 0.61–1.24)
Calcium: 9.4 mg/dL (ref 8.9–10.3)
Chloride: 106 mmol/L (ref 101–111)
GFR calc non Af Amer: >60 mL/min (ref >60)
GLUCOSE: 106 mg/dL — AB (ref 65–99)
Potassium: 3.4 mmol/L — ABNORMAL LOW (ref 3.5–5.1)
Sodium: 139 mmol/L (ref 135–145)

## 2014-08-11 LAB — C-REACTIVE PROTEIN: CRP: 0.7 mg/dL (ref <1.0)

## 2014-08-11 LAB — SEDIMENTATION RATE: SED RATE: 23 mm/h — AB (ref 0–16)

## 2014-08-11 MED ORDER — LORAZEPAM 2 MG/ML IJ SOLN
1.0000 mg | Freq: Once | INTRAMUSCULAR | Status: AC
  Administered 2014-08-11: 1 mg via INTRAVENOUS
  Filled 2014-08-11: qty 1

## 2014-08-11 MED ORDER — LORAZEPAM 2 MG/ML IJ SOLN
2.0000 mg | Freq: Once | INTRAMUSCULAR | Status: AC
  Administered 2014-08-11: 2 mg via INTRAVENOUS
  Filled 2014-08-11: qty 1

## 2014-08-11 MED ORDER — GADOBENATE DIMEGLUMINE 529 MG/ML IV SOLN
20.0000 mL | Freq: Once | INTRAVENOUS | Status: AC | PRN
  Administered 2014-08-11: 20 mL via INTRAVENOUS

## 2014-08-11 NOTE — H&P (Signed)
[facility] History and Physical  Patient: Jeremy Landry  MRN: [id number]  DOB: 09-23-1945  DOS: the patient was seen and examined on 08/11/2014 PCP: Default, Provider, MD  Referring physician: Dr. Rex Kras Chief Complaint: Bilateral numbness  HPI: Jeremy Landry is a 69 y.o. male with Past medical history of hypertension, GERD, prostate cancer, multiple back surgeries. The patient is presenting with numbness of bilateral lower leg numbness that has been ongoing since last one week. The patient mentions that he has been able to bear weight and also has been able to walk although his symptoms are limited chronically secondary to shortness of breath and recently due to unsteady gait. He denies any fall trauma or injury. Denies any weakness of the upper extremity but complains of numbness in both upper extremity and it Numbness in the left extremity. Denies any headache or dizziness or lightheadedness. He mentions is compliant with all medications. He denies any fever or chills. He mentions he cannot sense whether he is having a bowel movement or not or whether he is urinating or not. The symptoms is only present below his waist and in is not ascending and is actually improving. He denies any recent change in his medications. Denies taking any over-the-counter medication.  The patient is coming from home.  At his baseline ambulates without any support And is independent for most of his ADL manages her medication on his own.  Review of Systems: as mentioned in the history of present illness.  A comprehensive review of the other systems is negative.  Past Medical History  Diagnosis Date  . Hypertension   . GERD (gastroesophageal reflux disease)   . Prostate cancer    Past Surgical History  Procedure Laterality Date  . Back surgery    . Neck surgery     Social History:  reports that he has been smoking.  He does not have any smokeless tobacco history on file. He reports that he does  not drink alcohol or use illicit drugs.  Allergies  Allergen Reactions  . Aspirin Anaphylaxis  . Shellfish Allergy Anaphylaxis    Family History  Problem Relation Age of Onset  . Cancer Brother     Prior to Admission medications   Medication Sig Start Date End Date Taking? Authorizing Provider  amLODipine (NORVASC) 10 MG tablet Take 10 mg by mouth daily.   Yes Historical Provider, MD  finasteride (PROSCAR) 5 MG tablet Take 5 mg by mouth daily.   Yes Historical Provider, MD  loratadine (CLARITIN) 10 MG tablet Take 10 mg by mouth daily.   Yes Historical Provider, MD  naproxen (NAPROSYN) 500 MG tablet Take 500 mg by mouth 2 (two) times daily as needed. For pain   Yes Historical Provider, MD  omeprazole (PRILOSEC) 20 MG capsule Take 20 mg by mouth daily.   Yes Historical Provider, MD  terazosin (HYTRIN) 5 MG capsule Take 5 mg by mouth at bedtime.   Yes Historical Provider, MD  sertraline (ZOLOFT) 100 MG tablet Take 100 mg by mouth daily.    Historical Provider, MD    Physical Exam: Filed Vitals:   08/11/14 2200 08/11/14 2230 08/11/14 2300 08/11/14 2315  BP: 139/86 134/88 130/88 128/70  Pulse: 80 86 84 79  Temp:      TempSrc:      Resp:      SpO2: 97% 96% 96% 95%    General: Alert, Awake and Oriented to Time, Place and Person. Appear in mild distress Eyes: PERRL ENT:  Oral Mucosa clear moist. Neck: no JVD Cardiovascular: S1 and S2 Present, no Murmur, Peripheral Pulses Present Respiratory: Bilateral Air entry equal and Decreased,  Clear to Auscultation, n Crackles, o wheezes Abdomen: Bowel Sound present, Soft and no tender Skin: no Rash Extremities:  P bilateral pedal edema, no calf tenderness Neurologic: Grossly no focal neuro deficit.  Labs on Admission:  CBC:  Recent Labs Lab 08/11/14 1400  WBC 5.3  NEUTROABS 3.2  HGB 11.9*  HCT 36.1*  MCV 93.3  PLT 240    CMP     Component Value Date/Time   NA 139 08/11/2014 1400   K 3.4* 08/11/2014 1400   CL 106  08/11/2014 1400   CO2 26 08/11/2014 1400   GLUCOSE 106* 08/11/2014 1400   BUN 13 08/11/2014 1400   CREATININE 1.04 08/11/2014 1400   CALCIUM 9.4 08/11/2014 1400   PROT 6.7 05/24/2010 0630   ALBUMIN 3.4* 05/24/2010 0630   AST 15 05/24/2010 0630   ALT 18 05/24/2010 0630   ALKPHOS 58 05/24/2010 0630   BILITOT 0.4 05/24/2010 0630   GFRNONAA >60 08/11/2014 1400   GFRAA >60 08/11/2014 1400    No results for input(s): LIPASE, AMYLASE in the last 168 hours.  No results for input(s): CKTOTAL, CKMB, CKMBINDEX, TROPONINI in the last 168 hours. BNP (last 3 results) No results for input(s): BNP in the last 8760 hours.  ProBNP (last 3 results) No results for input(s): PROBNP in the last 8760 hours.   Radiological Exams on Admission: Dg Chest 2 View  08/11/2014   CLINICAL DATA:  Chronic shortness of breath. Chronic left upper extremity region pain. Hypertension.  EXAM: CHEST  2 VIEW  COMPARISON:  May 25, 2010  FINDINGS: There is slight scarring in the right and left lower lobe regions. There is no edema or consolidation. Heart size and pulmonary vascularity are normal. No adenopathy. There is postoperative change in the lower cervical region.  IMPRESSION: Areas of mild scarring in the lower lobes bilaterally. No edema or consolidation.   Electronically Signed   By: Lowella Grip III M.D.   On: 08/11/2014 14:42   Mr Lumbar Spine W Wo Contrast  08/11/2014   CLINICAL DATA:  Numbness and tingling both legs. Prior back surgery.  EXAM: MRI LUMBAR SPINE WITHOUT AND WITH CONTRAST  TECHNIQUE: Multiplanar and multiecho pulse sequences of the lumbar spine were obtained without and with intravenous contrast.  CONTRAST:  76m MULTIHANCE GADOBENATE DIMEGLUMINE 529 MG/ML IV SOLN  COMPARISON:  Lumbar radiographs 12/14/2007, CT myelogram 04/11/2004, lumbar MRI 03/24/2004  FINDINGS: Based on prior radiographs, 6 non-rib-bearing lumbar segments are present. The lowest of these will be labeled S1, consistent with  prior MRI and myelogram. Lowest disc space is S1-S2.  Straightening of the lumbar lordosis. Negative for fracture or mass. Multilevel degenerative change in the lumbar spine with extensive bone marrow signal abnormalities compatible with disc degeneration. Conus medullaris is normal and terminates at L1-2.  L1-2:  Mild disc and mild facet degeneration without spinal stenosis  L2-3: Diffuse disc bulging and spondylosis. Prominent right lateral osteophyte and disc complex similar to 2006. Bilateral facet degeneration. Small right-sided disc protrusion unchanged from the prior MRI. Mild spinal stenosis.  L3-4: Disc degeneration and spondylosis with diffuse endplate osteophyte formation. Bilateral facet hypertrophy. Improvement in right-sided disc protrusion since the prior studies. Moderate spinal stenosis. Moderate facet hypertrophy bilaterally. Foraminal encroachment is present bilaterally  L4-5: Diffuse disc bulging and spondylosis. Moderate spinal stenosis. Subarticular stenosis bilaterally. No change from the  prior studies  L5-S1: Prior laminotomy on the right. Disc degeneration and spurring right greater than left with subarticular and foraminal stenosis on the right. Moderate spinal stenosis unchanged from the prior study.  L5-S1: Disc and facet degeneration. No significant disc protrusion or stenosis.  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  The lowest disc space is labeled L5-S1.  L2-3 disc and facet degeneration and mild spinal stenosis unchanged. Small right-sided disc protrusion L2-3 unchanged.  L3-4 disc and facet degeneration with moderate spinal stenosis. Interval improvement in right-sided disc protrusion since 2006.  Moderate spinal stenosis L4-5 due to spondylosis and facet hypertrophy.  Moderate spinal stenosis L5-S1 unchanged from the prior study.   Electronically Signed   By: Franchot Gallo M.D.   On: 08/11/2014 18:54   EKG: Independently reviewed. there are no previous tracings available for comparison,  normal sinus rhythm.  Assessment/Plan Principal Problem:   Numbness and tingling of both legs Active Problems:   Generalized weakness   TIA (transient ischemic attack)   Smoker   1. Numbness and tingling of both legs The patient is presenting with complaints of bilateral leg numbness. He has an unsteady gait in the ER while ambulating. CT of the head is unremarkable MRI lumbar spine is unremarkable. Examination shows generalized weakness of all 4 extremities, reflexes present, possible carpal tunnel in both upper extremity as well as numbness in both upper and lower extremity diffusely. With his neurologic has been consulted will be following up with the patient. Further imaging depending on neurologic recommendation. We'll check MRI of the brain to rule out any acute stroke. Can not use aspirin since the pt is allergic to it. Will await neurology recommendation.   PTOT and speech consultation in the morning.  2. History of BPH. Continue home medication.  3.History of morbid disorder. Continue Zoloft.  4.GERD. Continuing PPI.  Advance goals of care discussion: Full code   Consults: ED discussed with neurology.  DVT Prophylaxis: subcutaneous Heparin Nutrition: nothing by mouth pending stroke evaluation  Disposition: Admitted as observation, telemetry unit.  Author: Berle Mull, MD [facility] Pager: [phone] 08/11/2014  If 7PM-7AM, please contact night-coverage [email or web address] Password [email or web address]

## 2014-08-11 NOTE — ED Notes (Signed)
Pt states that he has had numbness from the waist down for about 1 week pt denies bowel or bladder incontince but states that he cannot feel if he has urinated or had BM when he goes to the restroom. Pt reports hx of back surgeries. Pt denies any new injuries.

## 2014-08-11 NOTE — ED Notes (Signed)
This RN saw pt sitting at end of hall in rolling chair.  Pt is fully dressed.  This RN asked pt if he was ready to try to walk down the hall.  Pt attempted multiple times to stand.  This RN attempted to assist him.  Pt very unsteady on his feet, stumbling to the wall and grabbing the side rail.  This RN instructed pt to get back in to bed.  Pt not safe to ambulate on his own.  Pt continues to state "I'm okay to walk straight".  This RN had to catch patient multiple times while attempting to get patient back in to his bed.  Pt continues to state "I am ready to walk.  I can walk straight".

## 2014-08-11 NOTE — ED Notes (Signed)
Pt called MRI to determine when pt will be able to be transported to MRI and was told MRI is "extremely busy" and it will be about 45 more minutes until MRI is ready for patient. Pt updated.

## 2014-08-11 NOTE — ED Notes (Signed)
Pt made aware of POC.  Provided meal, okay'd by MD.

## 2014-08-11 NOTE — ED Notes (Signed)
MD made aware of interactions with pt and desire for MD to observe pt's attempt to walk.

## 2014-08-11 NOTE — ED Provider Notes (Signed)
CSN: [id number]     Arrival date & time 08/11/14  1243 History   First MD Initiated Contact with Patient 08/11/14 1332     Chief Complaint  Patient presents with  . Numbness     (Consider location/radiation/quality/duration/timing/severity/associated sxs/prior Treatment) HPI   Patient is a 69 year old male past medical history of neck surgery. Patient presents today with 1 week of numbness from his waist down. Patient states he was trying to get his things in order before coming to the hospital. Patient states he feels he can urinate but doesn't know when he stopped or started. Same with bowel movements. He is unable to tell unless he looks in the toilet. He denies any fever. Denies any trauma.  Past Medical History  Diagnosis Date  . Hypertension   . GERD (gastroesophageal reflux disease)    Past Surgical History  Procedure Laterality Date  . Back surgery    . Neck surgery     No family history on file. History  Substance Use Topics  . Smoking status: Current Every Day Smoker  . Smokeless tobacco: Not on file  . Alcohol Use: No    Review of Systems  Constitutional: Negative for fever and activity change.  HENT: Negative for drooling and hearing loss.   Eyes: Negative for discharge and redness.  Respiratory: Negative for cough and shortness of breath.   Cardiovascular: Negative for chest pain.  Gastrointestinal: Negative for abdominal pain.  Genitourinary: Negative for dysuria.  Musculoskeletal: Negative for arthralgias.  Allergic/Immunologic: Negative for immunocompromised state.  Neurological: Positive for numbness. Negative for seizures and speech difficulty.  Psychiatric/Behavioral: Negative for behavioral problems and agitation.  All other systems reviewed and are negative.     Allergies  Aspirin and Shellfish allergy  Home Medications   Prior to Admission medications   Medication Sig Start Date End Date Taking? Authorizing Provider  amLODipine (NORVASC) 10  MG tablet Take 10 mg by mouth daily.    Historical Provider, MD  finasteride (PROSCAR) 5 MG tablet Take 5 mg by mouth daily.    Historical Provider, MD  loratadine (CLARITIN) 10 MG tablet Take 10 mg by mouth daily.    Historical Provider, MD  naproxen (NAPROSYN) 500 MG tablet Take 500 mg by mouth 2 (two) times daily as needed. For pain    Historical Provider, MD  omeprazole (PRILOSEC) 20 MG capsule Take 20 mg by mouth daily.    Historical Provider, MD  sertraline (ZOLOFT) 100 MG tablet Take 100 mg by mouth daily.    Historical Provider, MD  terazosin (HYTRIN) 5 MG capsule Take 5 mg by mouth at bedtime.    Historical Provider, MD   BP 126/76 mmHg  Pulse 72  Temp(Src) 98.8 F (37.1 C) (Oral)  Resp 12  SpO2 95% Physical Exam  Constitutional: He is oriented to person, place, and time. He appears well-nourished.  HENT:  Head: Normocephalic.  Mouth/Throat: Oropharynx is clear and moist.  Eyes: Conjunctivae are normal.  Neck: No tracheal deviation present.  Cardiovascular: Normal rate.   Pulmonary/Chest: Effort normal. No stridor. No respiratory distress.  Abdominal: Soft. There is no tenderness. There is no guarding.  Musculoskeletal: Normal range of motion. He exhibits no edema.  Neurological: He is oriented to person, place, and time. No cranial nerve deficit.  Patient has numbness from waist down. Saddle anesthesia. Good rectal tone. Cannot discriminate whether touching right or left leg. Patient does have 5/5 strength bilaterally.  Normal sensation on trunk and bilateral arms. Cranial nerves intact.  Skin: Skin is warm and dry. No rash noted. He is not diaphoretic.  Psychiatric: He has a normal mood and affect. His behavior is normal.  Nursing note and vitals reviewed.   ED Course  LUMBAR PUNCTURE Date/Time: 08/12/2014 2:24 AM Performed by: Sharlett Iles Authorized by: Sharlett Iles Consent: Verbal consent obtained. Written consent obtained. Risks and benefits:  risks, benefits and alternatives were discussed Consent given by: patient Patient understanding: patient states understanding of the procedure being performed Patient consent: the patient's understanding of the procedure matches consent given Procedure consent: procedure consent matches procedure scheduled Relevant documents: relevant documents present and verified Test results: test results available and properly labeled Site marked: the operative site was marked Patient identity confirmed: verbally with patient and arm band Indications: evaluation for bilateral leg weakness and saddle anesthesia. Anesthesia: local infiltration Local anesthetic: lidocaine 1% without epinephrine Anesthetic total: 2.5 ml Patient sedated: no Lumbar space: L4-L5 interspace Patient's position: sitting Needle gauge: 18 Needle length: 5.0 in Number of attempts: 1 Patient tolerance: Patient tolerated the procedure well with no immediate complications Comments: Unsuccessful attempt. Terminated due to patient discomfort prior to entering subdural space.   (including critical care time) Labs Review Labs Reviewed  BASIC METABOLIC PANEL  CBC WITH DIFFERENTIAL/PLATELET  SEDIMENTATION RATE  C-REACTIVE PROTEIN    Imaging Review No results found.   EKG Interpretation None      MDM   Final diagnoses:  Numbness and tingling of both legs    Patient is a 69 year old male with past medical history significant for hypertension hyperlipidemia and remote back surgeries. He presents today with inability to feel his legs below his waist. Although it's been a week this is a very concerning symptom. He does have normal strength. Patient has saddle anesthesia. Concern for cauda equina or epidural abscess. Doubt GBS given normal motor systems. And the fact that he woke up with it from waist down and is not ascended.  Sharlett Iles, MD 08/12/14 [phone]

## 2014-08-11 NOTE — ED Notes (Signed)
Admitting at bedside 

## 2014-08-11 NOTE — ED Notes (Signed)
MD at bedside discussing LP with pt

## 2014-08-11 NOTE — ED Notes (Signed)
Pt continues to call out stating "I need to go!  I want to go!".  This RN explained to pt reasons for him needing to stay and pt verbalized understanding.

## 2014-08-11 NOTE — ED Notes (Addendum)
MD at bedside with Chester Holstein, RN attempting to ambulate pt.  Pt continues to be unsteady on his feet.  Pt made aware by MD of his expected admission and that he is to stay in the bed moving forward.  Pt saturations low when placed back on monitor after ambulation.  Pt placed on 2L O2 [place]

## 2014-08-11 NOTE — ED Notes (Signed)
Pt made aware of bed assignment 

## 2014-08-12 ENCOUNTER — Observation Stay (HOSPITAL_COMMUNITY): Payer: Medicare HMO

## 2014-08-12 ENCOUNTER — Encounter (HOSPITAL_COMMUNITY): Payer: Self-pay

## 2014-08-12 DIAGNOSIS — R202 Paresthesia of skin: Secondary | ICD-10-CM | POA: Diagnosis not present

## 2014-08-12 DIAGNOSIS — I10 Essential (primary) hypertension: Secondary | ICD-10-CM | POA: Diagnosis present

## 2014-08-12 DIAGNOSIS — F17200 Nicotine dependence, unspecified, uncomplicated: Secondary | ICD-10-CM | POA: Diagnosis present

## 2014-08-12 DIAGNOSIS — G459 Transient cerebral ischemic attack, unspecified: Secondary | ICD-10-CM | POA: Diagnosis present

## 2014-08-12 DIAGNOSIS — R531 Weakness: Secondary | ICD-10-CM

## 2014-08-12 DIAGNOSIS — K219 Gastro-esophageal reflux disease without esophagitis: Secondary | ICD-10-CM

## 2014-08-12 DIAGNOSIS — Z720 Tobacco use: Secondary | ICD-10-CM | POA: Diagnosis not present

## 2014-08-12 LAB — RAPID URINE DRUG SCREEN, HOSP PERFORMED
AMPHETAMINES: NOT DETECTED
Barbiturates: NOT DETECTED
Benzodiazepines: POSITIVE — AB
COCAINE: NOT DETECTED
Opiates: NOT DETECTED
Tetrahydrocannabinol: NOT DETECTED

## 2014-08-12 LAB — COMPREHENSIVE METABOLIC PANEL
ALK PHOS: 52 U/L (ref 38–126)
ALT: 15 U/L — ABNORMAL LOW (ref 17–63)
ALT: 14 U/L — AB (ref 17–63)
ANION GAP: 7 (ref 5–15)
AST: 17 U/L (ref 15–41)
AST: 18 U/L (ref 15–41)
Albumin: 3.8 g/dL (ref 3.5–5.0)
Albumin: 3.6 g/dL (ref 3.5–5.0)
Alkaline Phosphatase: 53 U/L (ref 38–126)
Anion gap: 8 (ref 5–15)
BILIRUBIN TOTAL: 0.8 mg/dL (ref 0.3–1.2)
BUN: 12 mg/dL (ref 6–20)
BUN: 10 mg/dL (ref 6–20)
CHLORIDE: 102 mmol/L (ref 101–111)
CO2: 27 mmol/L (ref 22–32)
CO2: 29 mmol/L (ref 22–32)
Calcium: 9.6 mg/dL (ref 8.9–10.3)
Calcium: 9.3 mg/dL (ref 8.9–10.3)
Chloride: 106 mmol/L (ref 101–111)
Creatinine, Ser: 0.87 mg/dL (ref 0.61–1.24)
Creatinine, Ser: 0.92 mg/dL (ref 0.61–1.24)
GFR calc Af Amer: >60 mL/min (ref >60)
GFR calc Af Amer: >60 mL/min (ref >60)
GFR calc non Af Amer: >60 mL/min (ref >60)
GLUCOSE: 131 mg/dL — AB (ref 65–99)
Glucose, Bld: 103 mg/dL — ABNORMAL HIGH (ref 65–99)
POTASSIUM: 3.3 mmol/L — AB (ref 3.5–5.1)
Potassium: 3.6 mmol/L (ref 3.5–5.1)
SODIUM: 138 mmol/L (ref 135–145)
Sodium: 141 mmol/L (ref 135–145)
Total Bilirubin: 0.6 mg/dL (ref 0.3–1.2)
Total Protein: 7.3 g/dL (ref 6.5–8.1)
Total Protein: 7 g/dL (ref 6.5–8.1)

## 2014-08-12 LAB — CBC
HCT: 35.9 % — ABNORMAL LOW (ref 39.0–52.0)
HEMOGLOBIN: 12.2 g/dL — AB (ref 13.0–17.0)
MCH: 31.3 pg (ref 26.0–34.0)
MCHC: 34 g/dL (ref 30.0–36.0)
MCV: 92.1 fL (ref 78.0–100.0)
PLATELETS: 237 10*3/uL (ref 150–400)
RBC: 3.9 MIL/uL — ABNORMAL LOW (ref 4.22–5.81)
RDW: 13 % (ref 11.5–15.5)
WBC: 6 10*3/uL (ref 4.0–10.5)

## 2014-08-12 LAB — PROTIME-INR
INR: 1.09 (ref 0.00–1.49)
Prothrombin Time: 14.3 seconds (ref 11.6–15.2)

## 2014-08-12 LAB — LIPID PANEL
Cholesterol: 156 mg/dL (ref 0–200)
HDL: 31 mg/dL — ABNORMAL LOW (ref >40)
LDL Cholesterol: 92 mg/dL (ref 0–99)
Total CHOL/HDL Ratio: 5 RATIO
Triglycerides: 167 mg/dL — ABNORMAL HIGH (ref <150)
VLDL: 33 mg/dL (ref 0–40)

## 2014-08-12 LAB — FOLATE: Folate: 12.3 ng/mL (ref >5.9)

## 2014-08-12 LAB — ETHANOL: Alcohol, Ethyl (B): <5 mg/dL (ref <5)

## 2014-08-12 LAB — VITAMIN B12: VITAMIN B 12: 358 pg/mL (ref 180–914)

## 2014-08-12 LAB — APTT: aPTT: 34 seconds (ref 24–37)

## 2014-08-12 LAB — TSH: TSH: 0.964 u[IU]/mL (ref 0.350–4.500)

## 2014-08-12 MED ORDER — FINASTERIDE 5 MG PO TABS
5.0000 mg | ORAL_TABLET | Freq: Every day | ORAL | Status: DC
  Administered 2014-08-12 – 2014-08-17 (×6): 5 mg via ORAL
  Filled 2014-08-12 (×6): qty 1

## 2014-08-12 MED ORDER — SENNOSIDES-DOCUSATE SODIUM 8.6-50 MG PO TABS
1.0000 | ORAL_TABLET | Freq: Every evening | ORAL | Status: DC | PRN
  Administered 2014-08-15: 1 via ORAL
  Filled 2014-08-12: qty 1

## 2014-08-12 MED ORDER — GADOBENATE DIMEGLUMINE 529 MG/ML IV SOLN
20.0000 mL | Freq: Once | INTRAVENOUS | Status: AC
  Administered 2014-08-12: 20 mL via INTRAVENOUS

## 2014-08-12 MED ORDER — IPRATROPIUM-ALBUTEROL 0.5-2.5 (3) MG/3ML IN SOLN
3.0000 mL | Freq: Three times a day (TID) | RESPIRATORY_TRACT | Status: DC
  Administered 2014-08-12 – 2014-08-17 (×16): 3 mL via RESPIRATORY_TRACT
  Filled 2014-08-12 (×17): qty 3

## 2014-08-12 MED ORDER — LORAZEPAM 2 MG/ML IJ SOLN
INTRAMUSCULAR | Status: AC
  Filled 2014-08-12: qty 1

## 2014-08-12 MED ORDER — IOHEXOL 350 MG/ML SOLN
100.0000 mL | Freq: Once | INTRAVENOUS | Status: AC | PRN
  Administered 2014-08-12: 100 mL via INTRAVENOUS

## 2014-08-12 MED ORDER — ENOXAPARIN SODIUM 40 MG/0.4ML ~~LOC~~ SOLN
40.0000 mg | SUBCUTANEOUS | Status: DC
  Administered 2014-08-12: 40 mg via SUBCUTANEOUS
  Filled 2014-08-12: qty 0.4

## 2014-08-12 MED ORDER — PANTOPRAZOLE SODIUM 40 MG PO TBEC
40.0000 mg | DELAYED_RELEASE_TABLET | Freq: Every day | ORAL | Status: DC
  Administered 2014-08-12 – 2014-08-17 (×6): 40 mg via ORAL
  Filled 2014-08-12 (×6): qty 1

## 2014-08-12 MED ORDER — STROKE: EARLY STAGES OF RECOVERY BOOK
Freq: Once | Status: AC
  Administered 2014-08-12: 03:00:00
  Filled 2014-08-12: qty 1

## 2014-08-12 MED ORDER — IPRATROPIUM-ALBUTEROL 0.5-2.5 (3) MG/3ML IN SOLN
3.0000 mL | Freq: Four times a day (QID) | RESPIRATORY_TRACT | Status: DC
  Administered 2014-08-12: 3 mL via RESPIRATORY_TRACT
  Filled 2014-08-12: qty 3

## 2014-08-12 MED ORDER — ACETAMINOPHEN 650 MG RE SUPP: 650.0000 mg | RECTAL | Status: DC | PRN

## 2014-08-12 MED ORDER — LORAZEPAM 2 MG/ML IJ SOLN
1.0000 mg | Freq: Once | INTRAMUSCULAR | Status: AC
  Administered 2014-08-12: 1 mg via INTRAVENOUS

## 2014-08-12 MED ORDER — ACETAMINOPHEN 325 MG PO TABS: 650.0000 mg | ORAL_TABLET | ORAL | Status: DC | PRN

## 2014-08-12 MED ORDER — PNEUMOCOCCAL VAC POLYVALENT 25 MCG/0.5ML IJ INJ
0.5000 mL | INJECTION | INTRAMUSCULAR | Status: DC
  Filled 2014-08-12: qty 0.5

## 2014-08-12 MED ORDER — METHYLPREDNISOLONE SODIUM SUCC 1000 MG IJ SOLR
1000.0000 mg | Freq: Every day | INTRAMUSCULAR | Status: AC
  Administered 2014-08-12 – 2014-08-16 (×5): 1000 mg via INTRAVENOUS
  Filled 2014-08-12 (×5): qty 8

## 2014-08-12 MED ORDER — SERTRALINE HCL 100 MG PO TABS
100.0000 mg | ORAL_TABLET | Freq: Every day | ORAL | Status: DC
  Administered 2014-08-12 – 2014-08-17 (×6): 100 mg via ORAL
  Filled 2014-08-12 (×6): qty 1

## 2014-08-12 MED ORDER — ONDANSETRON HCL 4 MG/2ML IJ SOLN: 4.0000 mg | Freq: Three times a day (TID) | INTRAMUSCULAR | Status: AC | PRN

## 2014-08-12 MED ORDER — TERAZOSIN HCL 5 MG PO CAPS
5.0000 mg | ORAL_CAPSULE | Freq: Every day | ORAL | Status: DC
  Administered 2014-08-12 – 2014-08-16 (×6): 5 mg via ORAL
  Filled 2014-08-12 (×7): qty 1

## 2014-08-12 NOTE — ED Notes (Signed)
Follow up report given to April on 4N.

## 2014-08-12 NOTE — Progress Notes (Signed)
Unable to perform NIF currently d/t pt not in room, pt has been out of room for several hours this morning.

## 2014-08-12 NOTE — ED Notes (Signed)
LP attempted.  Unable to gain access.  MD to notify admitting.

## 2014-08-12 NOTE — ED Provider Notes (Signed)
I received this patient in sign out from Dr. Thomasene Lot. Patient has had 1 week of bilateral leg numbness up to waist and saddle anesthesia. MRI findings below showed no spinal cord impingement or acute findings to explain the patient's symptoms. Attempted to ambulate the patient but he was ataxic and unable to walk safely. He states that his feet feel like jelly and I suspect that his difficulty walking is due to his numbness. Admitted the patient to general medicine for further evaluation and consulted neurology. Neurology recommended LP. Obtained a head CT which was negative acute. I attempted lumbar puncture at bedside, see procedure note for details, but was unsuccessful due to patient's previous lumbar spine surgery and presence of scar tissue. I have ordered a fluoroscopic guided lumbar puncture for the patient's workup in hospital.     CT Head Wo Contrast (Final result) Result time: 08/12/14 01:24:08   Final result by Rad Results In Interface (08/12/14 01:24:08)   Narrative:   CLINICAL DATA: Numb waistline for 5 days. History of hypertension and prostate cancer. Current smoker.  EXAM: CT HEAD WITHOUT CONTRAST  TECHNIQUE: Contiguous axial images were obtained from the base of the skull through the vertex without intravenous contrast.  COMPARISON: MRI of the head May 24, 2010  FINDINGS: The ventricles and sulci are normal for age. No intraparenchymal hemorrhage, mass effect nor midline shift. Patchy supratentorial white matter hypodensities are within normal range for patient's age and though non-specific suggest sequelae of chronic small vessel ischemic disease. No acute large vascular territory infarcts.  No abnormal extra-axial fluid collections. Basal cisterns are patent. Mild calcific atherosclerosis of the carotid siphons.  No skull fracture. The included ocular globes and orbital contents are non-suspicious. Moderate paranasal sinus mucosal thickening without air-fluid  levels. The mastoid air cells are well aerated.  IMPRESSION: No acute intracranial process.  Involutional changes. Moderate white matter changes compatible with chronic small vessel ischemic disease.   Electronically Signed By: Elon Alas M.D. On: 08/12/2014 01:24          MR Lumbar Spine W Wo Contrast (Final result) Result time: 08/11/14 18:54:51   Procedure changed from MR Lumbar Spine W Contrast      Final result by Rad Results In Interface (08/11/14 18:54:51)   Narrative:   CLINICAL DATA: Numbness and tingling both legs. Prior back surgery.  EXAM: MRI LUMBAR SPINE WITHOUT AND WITH CONTRAST  TECHNIQUE: Multiplanar and multiecho pulse sequences of the lumbar spine were obtained without and with intravenous contrast.  CONTRAST: 32m MULTIHANCE GADOBENATE DIMEGLUMINE 529 MG/ML IV SOLN  COMPARISON: Lumbar radiographs 12/14/2007, CT myelogram 04/11/2004, lumbar MRI 03/24/2004  FINDINGS: Based on prior radiographs, 6 non-rib-bearing lumbar segments are present. The lowest of these will be labeled S1, consistent with prior MRI and myelogram. Lowest disc space is S1-S2.  Straightening of the lumbar lordosis. Negative for fracture or mass. Multilevel degenerative change in the lumbar spine with extensive bone marrow signal abnormalities compatible with disc degeneration. Conus medullaris is normal and terminates at L1-2.  L1-2: Mild disc and mild facet degeneration without spinal stenosis  L2-3: Diffuse disc bulging and spondylosis. Prominent right lateral osteophyte and disc complex similar to 2006. Bilateral facet degeneration. Small right-sided disc protrusion unchanged from the prior MRI. Mild spinal stenosis.  L3-4: Disc degeneration and spondylosis with diffuse endplate osteophyte formation. Bilateral facet hypertrophy. Improvement in right-sided disc protrusion since the prior studies. Moderate spinal stenosis. Moderate facet hypertrophy  bilaterally. Foraminal encroachment is present bilaterally  L4-5: Diffuse disc bulging  and spondylosis. Moderate spinal stenosis. Subarticular stenosis bilaterally. No change from the prior studies  L5-S1: Prior laminotomy on the right. Disc degeneration and spurring right greater than left with subarticular and foraminal stenosis on the right. Moderate spinal stenosis unchanged from the prior study.  L5-S1: Disc and facet degeneration. No significant disc protrusion or stenosis.  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 The lowest disc space is labeled L5-S1.  L2-3 disc and facet degeneration and mild spinal stenosis unchanged. Small right-sided disc protrusion L2-3 unchanged.  L3-4 disc and facet degeneration with moderate spinal stenosis. Interval improvement in right-sided disc protrusion since 2006.  Moderate spinal stenosis L4-5 due to spondylosis and facet hypertrophy.  Moderate spinal stenosis L5-S1 unchanged from the prior study.   Electronically Signed By: Franchot Gallo M.D. On: 08/11/2014 18:54        Sharlett Iles, MD 08/12/14 [phone]

## 2014-08-12 NOTE — Consult Note (Signed)
Reason for Consult:Lower extremity numbness Referring Physician: Posey Pronto  CC: Lower extremity numbness  HPI: Jeremy Landry is an 69 y.o. male with a history of prostate cancer who reports that on Friday of last week in the evening he had a sensation of a sharp pain radiating from his rectum to his penis.  The sensation was short lived and afterward the patient was back to baseline.  He went to bed at his normal time around 2030 and when he awakened the next morning he felt numb from the waist down.  He reports that his symptoms of numbness have continued for the past week.  He was unable to come to the doctor because he had some personal things he had to take care of first.  He has been walking by holding on to things.  Although he has not been incontinent he can not tell when his urinary stream ends unless he is looking at it.  He also knows when he has to have bowel movement but must look back in the bowel to see if he has actually finished the bowel movement.  The patient also reports that he has chronic RUE numbness since his cervical surgery a few years ago but for the past week his LUE has been numb as well.  He feels the numbness has not progressed over the week.  There has been no pain associated with his symptoms other than on the day prior to the onset of his symptoms.    Past Medical History  Diagnosis Date  . Hypertension   . GERD (gastroesophageal reflux disease)   . Prostate cancer     Past Surgical History  Procedure Laterality Date  . Back surgery    . Neck surgery      Family History  Problem Relation Age of Onset  . Cancer Brother   Mother alive with Alzheimer's.  No information concerning father.    Social History:  reports that he has been smoking.  He does not have any smokeless tobacco history on file. He reports that he does not drink alcohol or use illicit drugs.  Allergies  Allergen Reactions  . Aspirin Anaphylaxis  . Shellfish Allergy Anaphylaxis     Medications:  I have reviewed the patient's current medications. Prior to Admission:  Prescriptions prior to admission  Medication Sig Dispense Refill Last Dose  . amLODipine (NORVASC) 10 MG tablet Take 10 mg by mouth daily.   08/10/2014 at Unknown time  . finasteride (PROSCAR) 5 MG tablet Take 5 mg by mouth daily.   08/10/2014 at Unknown time  . loratadine (CLARITIN) 10 MG tablet Take 10 mg by mouth daily.   08/10/2014 at Unknown time  . naproxen (NAPROSYN) 500 MG tablet Take 500 mg by mouth 2 (two) times daily as needed. For pain   Past Week at Unknown time  . omeprazole (PRILOSEC) 20 MG capsule Take 20 mg by mouth daily.   08/10/2014 at Unknown time  . terazosin (HYTRIN) 5 MG capsule Take 5 mg by mouth at bedtime.   08/10/2014 at   . sertraline (ZOLOFT) 100 MG tablet Take 100 mg by mouth daily.      Scheduled: . enoxaparin (LOVENOX) injection  40 mg Subcutaneous Q24H  . finasteride  5 mg Oral Daily  . ipratropium-albuterol  3 mL Nebulization TID  . pantoprazole  40 mg Oral Daily  . [START ON 08/13/2014] pneumococcal 23 valent vaccine  0.5 mL Intramuscular Tomorrow-1000  . sertraline  100 mg Oral Daily  .  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 ENT ROS: negative for - epistaxis, nasal discharge, oral lesions, sore throat, tinnitus or vertigo Allergy and Immunology ROS: negative for - hives or itchy/watery eyes Hematological and Lymphatic ROS: negative for - bleeding problems, bruising or swollen lymph nodes Endocrine ROS: negative for - galactorrhea, hair pattern changes, polydipsia/polyuria or temperature intolerance Respiratory ROS: SOB Cardiovascular ROS:  intermittent chest pain Gastrointestinal ROS: negative for - abdominal pain, diarrhea, hematemesis, nausea/vomiting or stool incontinence Genito-Urinary ROS: negative for - dysuria, hematuria, incontinence or urinary frequency/urgency Musculoskeletal ROS: negative for - joint swelling or muscular weakness Neurological ROS: as noted in HPI Dermatological ROS: negative for rash and skin lesion changes  Physical Examination: Blood pressure 129/80, pulse 94, temperature 97.7 F (36.5 C), temperature source Oral, resp. rate 22, height '6\' 2"'$  (1.88 m), weight 113.8 kg (250 lb 14.1 oz), SpO2 95 %.  HEENT-  Normocephalic, no lesions, without obvious abnormality.  Normal external eye and conjunctiva.  Normal TM's bilaterally.  Normal auditory canals and external ears. Normal external nose, mucus membranes and septum.  Normal pharynx. Cardiovascular- S1, S2 normal, pulses palpable throughout   Lungs- chest clear, no wheezing, rales, normal symmetric air entry Abdomen- soft, non-tender; bowel sounds normal; no masses,  no organomegaly Extremities- no edema and clawing of the RUE Lymph-no adenopathy palpable Musculoskeletal-no joint tenderness, deformity or swelling Skin-warm and dry, no hyperpigmentation, vitiligo, or suspicious lesions  Neurological Examination Mental Status: Alert, oriented, thought content appropriate.  Speech fluent without evidence of aphasia.  Able to follow 3 step commands without difficulty. Cranial Nerves: II: Discs flat bilaterally; Able to appreciate visual stimuli in all visual fields, pupils equal, round, reactive to light and accommodation III,IV, VI: ptosis not present, extra-ocular motions intact bilaterally V,VII: decrease in left NLF, facial light touch sensation normal bilaterally VIII: hearing normal bilaterally IX,X: gag reflex present XI: bilateral shoulder shrug XII: midline tongue extension Motor: Right : Upper extremity   4/5    Left:     Upper extremity    5-/5  Lower extremity   4/5     Lower extremity   4+/5 Tone and bulk:normal tone throughout; no atrophy noted Sensory: Pinprick and light touch decreased to below the umbilicus Deep Tendon Reflexes: 2+ and symmetric in the upper extremities, 1+ right knee jerk, absent left knee jerk, absent AJ's bilaterally Plantars: Right: downgoing   Left: downgoing Cerebellar: normal finger-to-nose testing bilaterally.  Heel-to-shin testing impaired due to weakness Gait: not tested due to safety concerns    Laboratory Studies:   Basic Metabolic Panel:  Recent Labs Lab 08/11/14 1400 08/12/14 0029  NA 139 141  K 3.4* 3.6  CL 106 106  CO2 26 27  GLUCOSE 106* 103*  BUN 13 12  CREATININE 1.04 0.87  CALCIUM 9.4 9.6    Liver Function Tests:  Recent Labs Lab 08/12/14 0029  AST 17  ALT 15*  ALKPHOS 53  BILITOT 0.8  PROT 7.3  ALBUMIN 3.8   No results for input(s): LIPASE, AMYLASE in the last 168 hours. No results for input(s): AMMONIA in the last 168 hours.  CBC:  Recent Labs Lab 08/11/14 1400 08/12/14 0029  WBC 5.3 6.0  NEUTROABS  3.2  --   HGB 11.9* 12.2*  HCT 36.1* 35.9*  MCV 93.3 92.1  PLT 240 237    Cardiac Enzymes: No results for input(s): CKTOTAL, CKMB, CKMBINDEX, TROPONINI in the last 168 hours.  BNP: Invalid input(s): POCBNP  CBG: No results for input(s): GLUCAP in the last 168 hours.  Microbiology: Results for orders placed or performed during the hospital encounter of 05/23/10  Urine culture     Status: None   Collection Time: 05/25/10  8:33 AM  Result Value Ref Range Status   Specimen Description URINE, CLEAN CATCH  Final   Special Requests NONE  Final   Culture  Setup Time [id number]  Final   Colony Count NO GROWTH  Final   Culture NO GROWTH  Final   Report Status 05/26/2010 FINAL  Final    Coagulation Studies:  Recent Labs  08/12/14 0029  LABPROT 14.3  INR 1.09    Urinalysis: No results for input(s): COLORURINE, LABSPEC, PHURINE,  GLUCOSEU, HGBUR, BILIRUBINUR, KETONESUR, PROTEINUR, UROBILINOGEN, NITRITE, LEUKOCYTESUR in the last 168 hours.  Invalid input(s): APPERANCEUR  Lipid Panel:     Component Value Date/Time   CHOL 121 05/24/2010 0630   TRIG 152* 05/24/2010 0630   HDL 38* 05/24/2010 0630   CHOLHDL 3.2 05/24/2010 0630   VLDL 30 05/24/2010 0630   LDLCALC  05/24/2010 0630    53        Total Cholesterol/HDL:CHD Risk Coronary Heart Disease Risk Table                     Men   Women  1/2 Average Risk   3.4   3.3  Average Risk       5.0   4.4  2 X Average Risk   9.6   7.1  3 X Average Risk  23.4   11.0        Use the calculated Patient Ratio above and the CHD Risk Table to determine the patient's CHD Risk.        ATP III CLASSIFICATION (LDL):  <100     mg/dL   Optimal  100-129  mg/dL   Near or Above                    Optimal  130-159  mg/dL   Borderline  160-189  mg/dL   High  >190     mg/dL   Very High    HgbA1C:  Lab Results  Component Value Date   HGBA1C  05/24/2010    5.1 (NOTE)                                                                       According to the ADA Clinical Practice Recommendations for 2011, when HbA1c is used as a screening test:   >=6.5%   Diagnostic of Diabetes Mellitus           (if abnormal result  is confirmed)  5.7-6.4%   Increased risk of developing Diabetes Mellitus  References:Diagnosis and Classification of Diabetes Mellitus,Diabetes JKKX,3818,29(HBZJI 1):S62-S69 and Standards of Medical Care in         Diabetes - 2011,Diabetes Care,2011,34  (Suppl 1):S11-S61.    Urine Drug Screen:  Component Value Date/Time   LABOPIA NONE DETECTED 08/12/2014 0407   COCAINSCRNUR NONE DETECTED 08/12/2014 0407   LABBENZ POSITIVE* 08/12/2014 0407   AMPHETMU NONE DETECTED 08/12/2014 0407   THCU NONE DETECTED 08/12/2014 0407   LABBARB NONE DETECTED 08/12/2014 0407    Alcohol Level:  Recent Labs Lab 08/12/14 0029  ETH <5    Other results: EKG: sinus rhythm at 73  bpm.  Imaging: Dg Chest 2 View  08/11/2014   CLINICAL DATA:  Chronic shortness of breath. Chronic left upper extremity region pain. Hypertension.  EXAM: CHEST  2 VIEW  COMPARISON:  May 25, 2010  FINDINGS: There is slight scarring in the right and left lower lobe regions. There is no edema or consolidation. Heart size and pulmonary vascularity are normal. No adenopathy. There is postoperative change in the lower cervical region.  IMPRESSION: Areas of mild scarring in the lower lobes bilaterally. No edema or consolidation.   Electronically Signed   By: Lowella Grip III M.D.   On: 08/11/2014 14:42   Ct Head Wo Contrast  08/12/2014   CLINICAL DATA:  Numb waistline for 5 days. History of hypertension and prostate cancer. Current smoker.  EXAM: CT HEAD WITHOUT CONTRAST  TECHNIQUE: Contiguous axial images were obtained from the base of the skull through the vertex without intravenous contrast.  COMPARISON:  MRI of the head May 24, 2010  FINDINGS: The ventricles and sulci are normal for age. No intraparenchymal hemorrhage, mass effect nor midline shift. Patchy supratentorial white matter hypodensities are within normal range for patient's age and though non-specific suggest sequelae of chronic small vessel ischemic disease. No acute large vascular territory infarcts.  No abnormal extra-axial fluid collections. Basal cisterns are patent. Mild calcific atherosclerosis of the carotid siphons.  No skull fracture. The included ocular globes and orbital contents are non-suspicious. Moderate paranasal sinus mucosal thickening without air-fluid levels. The mastoid air cells are well aerated.  IMPRESSION: No acute intracranial process.  Involutional changes. Moderate white matter changes compatible with chronic small vessel ischemic disease.   Electronically Signed   By: Elon Alas M.D.   On: 08/12/2014 01:24   Mr Lumbar Spine W Wo Contrast  08/11/2014   CLINICAL DATA:  Numbness and tingling both legs. Prior back  surgery.  EXAM: MRI LUMBAR SPINE WITHOUT AND WITH CONTRAST  TECHNIQUE: Multiplanar and multiecho pulse sequences of the lumbar spine were obtained without and with intravenous contrast.  CONTRAST:  75m MULTIHANCE GADOBENATE DIMEGLUMINE 529 MG/ML IV SOLN  COMPARISON:  Lumbar radiographs 12/14/2007, CT myelogram 04/11/2004, lumbar MRI 03/24/2004  FINDINGS: Based on prior radiographs, 6 non-rib-bearing lumbar segments are present. The lowest of these will be labeled S1, consistent with prior MRI and myelogram. Lowest disc space is S1-S2.  Straightening of the lumbar lordosis. Negative for fracture or mass. Multilevel degenerative change in the lumbar spine with extensive bone marrow signal abnormalities compatible with disc degeneration. Conus medullaris is normal and terminates at L1-2.  L1-2:  Mild disc and mild facet degeneration without spinal stenosis  L2-3: Diffuse disc bulging and spondylosis. Prominent right lateral osteophyte and disc complex similar to 2006. Bilateral facet degeneration. Small right-sided disc protrusion unchanged from the prior MRI. Mild spinal stenosis.  L3-4: Disc degeneration and spondylosis with diffuse endplate osteophyte formation. Bilateral facet hypertrophy. Improvement in right-sided disc protrusion since the prior studies. Moderate spinal stenosis. Moderate facet hypertrophy bilaterally. Foraminal encroachment is present bilaterally  L4-5: Diffuse disc bulging and spondylosis. Moderate spinal stenosis. Subarticular stenosis bilaterally. No change  from the prior studies  L5-S1: Prior laminotomy on the right. Disc degeneration and spurring right greater than left with subarticular and foraminal stenosis on the right. Moderate spinal stenosis unchanged from the prior study.  L5-S1: Disc and facet degeneration. No significant disc protrusion or stenosis.  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  The lowest disc space is labeled L5-S1.  L2-3 disc and facet degeneration and mild spinal stenosis  unchanged. Small right-sided disc protrusion L2-3 unchanged.  L3-4 disc and facet degeneration with moderate spinal stenosis. Interval improvement in right-sided disc protrusion since 2006.  Moderate spinal stenosis L4-5 due to spondylosis and facet hypertrophy.  Moderate spinal stenosis L5-S1 unchanged from the prior study.   Electronically Signed   By: Franchot Gallo M.D.   On: 08/11/2014 18:54     Assessment/Plan: 69 year old male with complaints of bilateral lower extremity and left upper extremity numbness for the past weak.  Although no incontinence, saddle anesthesia described.  MRI of the lumbar spine independently reviewed and shows no evidence of cord compression or prostate cancer metastasis.  With involvement of the upper extremity concern is for the cervical or thoracic spine.  Cord involvement due to metastasis versus infarct in the differential.  Patient with vascular risk factors.  Further imaging recommended.    Recommendations: 1.  MRI of the cervical and thoracic spines.  MRI contacted.  Will perform these two studies STAT with more preference than MRI of the brain.   2.  If above unremarkable would consider LP by IR since unable to obtain in ED.   3.  PT/OT consults    Alexis Goodell, MD [facility] [phone] 08/12/2014, 5:50 AM

## 2014-08-12 NOTE — Progress Notes (Signed)
[facility] PROGRESS NOTE  Jeremy Landry TFT:[id number] DOB: 1945/11/07 DOA: 08/11/2014 PCP: Default, Provider, MD  Summary  69 y.o. male with Past medical history of hypertension, GERD, prostate cancer, multiple back surgeries. The patient is presenting with numbness of bilateral lower leg numbness that has been ongoing since last one week.  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 Neurology following and recommend high dose solumedrol and further imaging of the aorta Active Problems: HTN GERD   Smoker   Code Status:  full Family Communication:   Disposition Plan:    Consultants:  neuro  Procedures:     Antibiotics:    HPI/Subjective: Somnolent after ativan for MRI  Objective: Filed Vitals:   08/12/14 1415  BP: 134/72  Pulse:   Temp: 97.9 F (36.6 C)  Resp: 18    Intake/Output Summary (Last 24 hours) at 08/12/14 1530 Last data filed at 08/12/14 0300  Gross per 24 hour  Intake      0 ml  Output   1200 ml  Net  -1200 ml   Filed Weights   08/12/14 0220  Weight: 113.8 kg (250 lb 14.1 oz)    Exam:   General:  Asleep. Difficult to rouse  Cardiovascular: RRR without MGR  Respiratory: CTA without WRR  Abdomen: S, NT ND  Ext: no CCE  Neuro: too sleepy to cooperate  Basic Metabolic Panel:  Recent Labs Lab 08/11/14 1400 08/12/14 0029  NA 139 141  K 3.4* 3.6  CL 106 106  CO2 26 27  GLUCOSE 106* 103*  BUN 13 12  CREATININE 1.04 0.87  CALCIUM 9.4 9.6   Liver Function Tests:  Recent Labs Lab 08/12/14 0029  AST 17  ALT 15*  ALKPHOS 53  BILITOT 0.8  PROT 7.3  ALBUMIN 3.8   No results for input(s): LIPASE, AMYLASE in the last 168 hours. No results for input(s): AMMONIA in the last 168 hours. CBC:  Recent Labs Lab 08/11/14 1400 08/12/14 0029  WBC 5.3 6.0  NEUTROABS 3.2  --   HGB 11.9* 12.2*  HCT 36.1* 35.9*   MCV 93.3 92.1  PLT 240 237   Cardiac Enzymes: No results for input(s): CKTOTAL, CKMB, CKMBINDEX, TROPONINI in the last 168 hours. BNP (last 3 results) No results for input(s): BNP in the last 8760 hours.  ProBNP (last 3 results) No results for input(s): PROBNP in the last 8760 hours.  CBG: No results for input(s): GLUCAP in the last 168 hours.  No results found for this or any previous visit (from the past 240 hour(s)).   Studies: Dg Chest 2 View  08/11/2014   CLINICAL DATA:  Chronic shortness of breath. Chronic left upper extremity region pain. Hypertension.  EXAM: CHEST  2 VIEW  COMPARISON:  May 25, 2010  FINDINGS: There is slight scarring in the right and left lower lobe regions. There is no edema or consolidation. Heart size and pulmonary vascularity are normal. No adenopathy. There is postoperative change in the lower cervical region.  IMPRESSION: Areas of mild scarring in the lower lobes bilaterally. No edema or consolidation.   Electronically Signed   By: Lowella Grip III M.D.   On: 08/11/2014 14:42   Ct Head Wo Contrast  08/12/2014   CLINICAL DATA:  Numb waistline for 5 days. History of hypertension and prostate cancer. Current smoker.  EXAM: CT HEAD WITHOUT CONTRAST  TECHNIQUE: Contiguous axial  images were obtained from the base of the skull through the vertex without intravenous contrast.  COMPARISON:  MRI of the head May 24, 2010  FINDINGS: The ventricles and sulci are normal for age. No intraparenchymal hemorrhage, mass effect nor midline shift. Patchy supratentorial white matter hypodensities are within normal range for patient's age and though non-specific suggest sequelae of chronic small vessel ischemic disease. No acute large vascular territory infarcts.  No abnormal extra-axial fluid collections. Basal cisterns are patent. Mild calcific atherosclerosis of the carotid siphons.  No skull fracture. The included ocular globes and orbital contents are non-suspicious. Moderate  paranasal sinus mucosal thickening without air-fluid levels. The mastoid air cells are well aerated.  IMPRESSION: No acute intracranial process.  Involutional changes. Moderate white matter changes compatible with chronic small vessel ischemic disease.   Electronically Signed   By: Elon Alas M.D.   On: 08/12/2014 01:24   Mr Cervical Spine Wo Contrast  08/12/2014   CLINICAL DATA:  Numbness and bilateral lower extremity weakness for a little more than 1 week. History of prostate cancer.  EXAM: MRI CERVICAL AND THORACIC SPINE WITHOUT CONTRAST  TECHNIQUE: Multiplanar and multiecho pulse sequences of the cervical and thoracic spine were obtained without intravenous contrast.  COMPARISON:  None.  FINDINGS: MR CERVICAL SPINE FINDINGS  The study is degraded by patient motion. The patient is status post C4-7 anterior discectomy and fusion. There is straightening of the normal cervical lordosis but vertebral body height and alignment are maintained. The craniocervical junction is normal. Cervical cord signal is unremarkable. The patient has a congenitally narrow central spinal canal. Imaged paraspinous structures are unremarkable.  C2-3: Shallow disc bulge without central canal or foraminal narrowing.  C3-4: Disc osteophyte complex narrows the ventral thecal sac but the central canal and foramina appear open.  C4-5: Status post discectomy and fusion. The central canal is open. Uncovertebral spurring causes mild to moderate foraminal narrowing, more notable on the right.  C5-6: Status post discectomy and fusion. The central canal is patent. Uncovertebral spurring causes moderate to moderately severe foraminal narrowing, worse on the right.  C6-7: Status post discectomy and fusion. Endplate spurring and uncovertebral disease are seen. The ventral cord is mildly deformed. Moderately severe to severe bilateral foraminal narrowing is identified.  C7-T1: Shallow disc bulge endplate spur. The central canal is mildly  narrowed. Moderate to moderately severe bilateral foraminal narrowing appears worse on the right.  MR THORACIC SPINE FINDINGS  Vertebral body height, signal and alignment are maintained. There is signal abnormality centrally within the cord from approximately the T3-4 level through the superior aspect of T5. The cord appears anteriorly positioned against the T4 vertebral body which is likely due to normal kyphosis. The thoracic cord is otherwise unremarkable. The central canal and neural foramina appear patent at all levels. Small central protrusion at T8-9 and shallow right paracentral protrusion T9-10 are noted.  IMPRESSION: Signal abnormality within the thoracic cord from the T3-4 through upper T5 level is nonspecific. Main differential considerations are cord infarct versus transverse myelitis. The cord appears anteriorly positioned this level which is likely due to kyphosis rather than a ventral dural defect. Post-contrast and diffusion imaging of the thoracic cord are recommended.  Status post C4-7 fusion. Uncovertebral and endplate spurring cause foraminal narrowing at the fusion levels. The cord is mildly deformed at the C6-7 level as described above.  Critical Value/emergent results were called by telephone at the time of interpretation on 08/12/2014 at 9:13 am to Dr. Alexis Goodell ,  who verbally acknowledged these results.   Electronically Signed   By: Inge Rise M.D.   On: 08/12/2014 09:16   Mr Thoracic Spine Wo Contrast  08/12/2014   CLINICAL DATA:  Numbness and bilateral lower extremity weakness for a little more than 1 week. History of prostate cancer.  EXAM: MRI CERVICAL AND THORACIC SPINE WITHOUT CONTRAST  TECHNIQUE: Multiplanar and multiecho pulse sequences of the cervical and thoracic spine were obtained without intravenous contrast.  COMPARISON:  None.  FINDINGS: MR CERVICAL SPINE FINDINGS  The study is degraded by patient motion. The patient is status post C4-7 anterior discectomy and  fusion. There is straightening of the normal cervical lordosis but vertebral body height and alignment are maintained. The craniocervical junction is normal. Cervical cord signal is unremarkable. The patient has a congenitally narrow central spinal canal. Imaged paraspinous structures are unremarkable.  C2-3: Shallow disc bulge without central canal or foraminal narrowing.  C3-4: Disc osteophyte complex narrows the ventral thecal sac but the central canal and foramina appear open.  C4-5: Status post discectomy and fusion. The central canal is open. Uncovertebral spurring causes mild to moderate foraminal narrowing, more notable on the right.  C5-6: Status post discectomy and fusion. The central canal is patent. Uncovertebral spurring causes moderate to moderately severe foraminal narrowing, worse on the right.  C6-7: Status post discectomy and fusion. Endplate spurring and uncovertebral disease are seen. The ventral cord is mildly deformed. Moderately severe to severe bilateral foraminal narrowing is identified.  C7-T1: Shallow disc bulge endplate spur. The central canal is mildly narrowed. Moderate to moderately severe bilateral foraminal narrowing appears worse on the right.  MR THORACIC SPINE FINDINGS  Vertebral body height, signal and alignment are maintained. There is signal abnormality centrally within the cord from approximately the T3-4 level through the superior aspect of T5. The cord appears anteriorly positioned against the T4 vertebral body which is likely due to normal kyphosis. The thoracic cord is otherwise unremarkable. The central canal and neural foramina appear patent at all levels. Small central protrusion at T8-9 and shallow right paracentral protrusion T9-10 are noted.  IMPRESSION: Signal abnormality within the thoracic cord from the T3-4 through upper T5 level is nonspecific. Main differential considerations are cord infarct versus transverse myelitis. The cord appears anteriorly positioned  this level which is likely due to kyphosis rather than a ventral dural defect. Post-contrast and diffusion imaging of the thoracic cord are recommended.  Status post C4-7 fusion. Uncovertebral and endplate spurring cause foraminal narrowing at the fusion levels. The cord is mildly deformed at the C6-7 level as described above.  Critical Value/emergent results were called by telephone at the time of interpretation on 08/12/2014 at 9:13 am to Dr. Alexis Goodell , who verbally acknowledged these results.   Electronically Signed   By: Inge Rise M.D.   On: 08/12/2014 09:16   Mr Thoracic Spine W Contrast  08/12/2014   CLINICAL DATA:  Patient with history of prostate cancer. Acute onset of pain radiating from rectum to penis. On waking the next day, patient had numbness from waist down.  EXAM: MRI THORACIC SPINE WITH CONTRAST  TECHNIQUE: Multiplanar and multiecho pulse sequences of the thoracic spine were obtained with intravenous contrast.  CONTRAST:  67m MULTIHANCE GADOBENATE DIMEGLUMINE 529 MG/ML IV SOLN  COMPARISON:  08/12/2014 cervical and thoracic MRI.  FINDINGS: This study compliments the previous cervical and thoracic MRI. Counting was performed from the craniocervical junction. C4 through C7 ACDF. Bone marrow signal shows heterogenous marrow. This  is a nonspecific finding most commonly associated with obesity, anemia, cigarette smoking or chronic disease. No enhancing aggressive osseous lesions are present. There is enhancement of the T1 superior endplate which is compatible with adjacent segment disease from the relatively long segment cervical fusion above. No findings to suggest bony metastatic disease.  Thoracic cord is swollen with increased intramedullary signal extending from T3-T4 through the mid T5 vertebra. This area demonstrates post gadolinium enhancement. No intramedullary hematoma. There is increased diffusion signal on diffusion-weighted imaging which could be associated with either infarct  or transverse myelitis. No evidence of AVM. On the axial images, the cord enhancement is central in the lesion. No thoracic spinal stenosis.  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 Central enhancement after gadolinium administration with probable restricted diffusion. The differential considerations are unchanged and this pre and post gadolinium study does not establish a conclusive diagnosis. Most likely differential considerations are cord infarct versus transverse myelitis. Given these differential considerations, the clinical presentation would probably be more helpful in narrowing the differential diagnosis.   Electronically Signed   By: Dereck Ligas M.D.   On: 08/12/2014 12:48   Mr Lumbar Spine W Wo Contrast  08/11/2014   CLINICAL DATA:  Numbness and tingling both legs. Prior back surgery.  EXAM: MRI LUMBAR SPINE WITHOUT AND WITH CONTRAST  TECHNIQUE: Multiplanar and multiecho pulse sequences of the lumbar spine were obtained without and with intravenous contrast.  CONTRAST:  19m MULTIHANCE GADOBENATE DIMEGLUMINE 529 MG/ML IV SOLN  COMPARISON:  Lumbar radiographs 12/14/2007, CT myelogram 04/11/2004, lumbar MRI 03/24/2004  FINDINGS: Based on prior radiographs, 6 non-rib-bearing lumbar segments are present. The lowest of these will be labeled S1, consistent with prior MRI and myelogram. Lowest disc space is S1-S2.  Straightening of the lumbar lordosis. Negative for fracture or mass. Multilevel degenerative change in the lumbar spine with extensive bone marrow signal abnormalities compatible with disc degeneration. Conus medullaris is normal and terminates at L1-2.  L1-2:  Mild disc and mild facet degeneration without spinal stenosis  L2-3: Diffuse disc bulging and spondylosis. Prominent right lateral osteophyte and disc complex similar to 2006. Bilateral facet degeneration. Small right-sided disc protrusion unchanged from the prior MRI. Mild spinal  stenosis.  L3-4: Disc degeneration and spondylosis with diffuse endplate osteophyte formation. Bilateral facet hypertrophy. Improvement in right-sided disc protrusion since the prior studies. Moderate spinal stenosis. Moderate facet hypertrophy bilaterally. Foraminal encroachment is present bilaterally  L4-5: Diffuse disc bulging and spondylosis. Moderate spinal stenosis. Subarticular stenosis bilaterally. No change from the prior studies  L5-S1: Prior laminotomy on the right. Disc degeneration and spurring right greater than left with subarticular and foraminal stenosis on the right. Moderate spinal stenosis unchanged from the prior study.  L5-S1: Disc and facet degeneration. No significant disc protrusion or stenosis.  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  The lowest disc space is labeled L5-S1.  L2-3 disc and facet degeneration and mild spinal stenosis unchanged. Small right-sided disc protrusion L2-3 unchanged.  L3-4 disc and facet degeneration with moderate spinal stenosis. Interval improvement in right-sided disc protrusion since 2006.  Moderate spinal stenosis L4-5 due to spondylosis and facet hypertrophy.  Moderate spinal stenosis L5-S1 unchanged from the prior study.   Electronically Signed   By: CFranchot GalloM.D.   On: 08/11/2014 18:54    Scheduled Meds: . finasteride  5 mg Oral Daily  . ipratropium-albuterol  3 mL Nebulization TID  . LORazepam      . methylPREDNISolone (SOLU-MEDROL) injection  1,000  mg Intravenous Daily  . pantoprazole  40 mg Oral Daily  . [START ON 08/13/2014] pneumococcal 23 valent vaccine  0.5 mL Intramuscular Tomorrow-1000  . sertraline  100 mg Oral Daily  . terazosin  5 mg Oral QHS   Continuous Infusions:   Time spent: 15 minutes  [facility] [email or web address], password [facility] 08/12/2014, 3:30 PM

## 2014-08-12 NOTE — Progress Notes (Signed)
Pt. Arrived from ED with VSS and reporting no pain. Will continue to monitor. Joaquin Bend E, [place] 08/12/2014 2:37 AM

## 2014-08-12 NOTE — Progress Notes (Addendum)
Subjective:   Objective: Current vital signs: BP 134/72 mmHg  Pulse 83  Temp(Src) 97.9 F (36.6 C) (Oral)  Resp 18  Ht '6\' 2"'$  (1.88 m)  Wt 113.8 kg (250 lb 14.1 oz)  BMI 32.20 kg/m2  SpO2 94% Vital signs in last 24 hours: Temp:  [97.7 F (36.5 C)-98.6 F (37 C)] 97.9 F (36.6 C) (08/05 1415) Pulse Rate:  [66-116] 83 (08/05 0600) Resp:  [18-34] 18 (08/05 1415) BP: (116-161)/(56-115) 134/72 mmHg (08/05 1415) SpO2:  [92 %-99 %] 94 % (08/05 1415) Weight:  [113.8 kg (250 lb 14.1 oz)] 113.8 kg (250 lb 14.1 oz) (08/05 0220)  Intake/Output from previous day: 08/04 0701 - 08/05 0700 In: -  Out: 1200 [Urine:1200] Intake/Output this shift:   Nutritional status: Diet Heart Room service appropriate?: Yes; Fluid consistency:: Thin  Neurologic Exam: Mental Status: Alert, oriented, thought content appropriate. Speech fluent without evidence of aphasia. Able to follow 3 step commands without difficulty. Cranial Nerves: II: Able to appreciate visual stimuli in all visual fields, pupils equal, round, reactive to light and accommodation III,IV, VI: ptosis not present, extra-ocular motions intact bilaterally V,VII: decrease in left NLF, facial light touch sensation normal bilaterally VIII: hearing normal bilaterally IX,X: gag reflex present XI: bilateral shoulder shrug XII: midline tongue extension Motor: Right :Upper extremity 4/5Left: Upper extremity 5/5 Lower extremity 4/5Lower extremity 4/5 Tone and bulk:normal tone throughout; no atrophy noted Sensory: Pinprick and light touch decreased to below the umbilicus, no proprioception or vibratory sensation in feet.   Deep Tendon Reflexes: 2+ and symmetric in the upper extremities, 1+ right knee jerk, absent left knee jerk, absent AJ's bilaterally Plantars: Right: downgoingLeft: downgoing  Lab  Results: Basic Metabolic Panel:  Recent Labs Lab 08/11/14 1400 08/12/14 0029  NA 139 141  K 3.4* 3.6  CL 106 106  CO2 26 27  GLUCOSE 106* 103*  BUN 13 12  CREATININE 1.04 0.87  CALCIUM 9.4 9.6    Liver Function Tests:  Recent Labs Lab 08/12/14 0029  AST 17  ALT 15*  ALKPHOS 53  BILITOT 0.8  PROT 7.3  ALBUMIN 3.8   No results for input(s): LIPASE, AMYLASE in the last 168 hours. No results for input(s): AMMONIA in the last 168 hours.  CBC:  Recent Labs Lab 08/11/14 1400 08/12/14 0029  WBC 5.3 6.0  NEUTROABS 3.2  --   HGB 11.9* 12.2*  HCT 36.1* 35.9*  MCV 93.3 92.1  PLT 240 237    Cardiac Enzymes: No results for input(s): CKTOTAL, CKMB, CKMBINDEX, TROPONINI in the last 168 hours.  Lipid Panel:  Recent Labs Lab 08/12/14 0532  CHOL 156  TRIG 167*  HDL 31*  CHOLHDL 5.0  VLDL 33  LDLCALC 92    CBG: No results for input(s): GLUCAP in the last 168 hours.  Microbiology: Results for orders placed or performed during the hospital encounter of 05/23/10  Urine culture     Status: None   Collection Time: 05/25/10  8:33 AM  Result Value Ref Range Status   Specimen Description URINE, CLEAN CATCH  Final   Special Requests NONE  Final   Culture  Setup Time [id number]  Final   Colony Count NO GROWTH  Final   Culture NO GROWTH  Final   Report Status 05/26/2010 FINAL  Final    Coagulation Studies:  Recent Labs  08/12/14 0029  LABPROT 14.3  INR 1.09    Imaging: Dg Chest 2 View  08/11/2014   CLINICAL DATA:  Chronic shortness  of breath. Chronic left upper extremity region pain. Hypertension.  EXAM: CHEST  2 VIEW  COMPARISON:  May 25, 2010  FINDINGS: There is slight scarring in the right and left lower lobe regions. There is no edema or consolidation. Heart size and pulmonary vascularity are normal. No adenopathy. There is postoperative change in the lower cervical region.  IMPRESSION: Areas of mild scarring in the lower lobes bilaterally. No edema or  consolidation.   Electronically Signed   By: Lowella Grip III M.D.   On: 08/11/2014 14:42   Ct Head Wo Contrast  08/12/2014   CLINICAL DATA:  Numb waistline for 5 days. History of hypertension and prostate cancer. Current smoker.  EXAM: CT HEAD WITHOUT CONTRAST  TECHNIQUE: Contiguous axial images were obtained from the base of the skull through the vertex without intravenous contrast.  COMPARISON:  MRI of the head May 24, 2010  FINDINGS: The ventricles and sulci are normal for age. No intraparenchymal hemorrhage, mass effect nor midline shift. Patchy supratentorial white matter hypodensities are within normal range for patient's age and though non-specific suggest sequelae of chronic small vessel ischemic disease. No acute large vascular territory infarcts.  No abnormal extra-axial fluid collections. Basal cisterns are patent. Mild calcific atherosclerosis of the carotid siphons.  No skull fracture. The included ocular globes and orbital contents are non-suspicious. Moderate paranasal sinus mucosal thickening without air-fluid levels. The mastoid air cells are well aerated.  IMPRESSION: No acute intracranial process.  Involutional changes. Moderate white matter changes compatible with chronic small vessel ischemic disease.   Electronically Signed   By: Elon Alas M.D.   On: 08/12/2014 01:24   Mr Cervical Spine Wo Contrast  08/12/2014   CLINICAL DATA:  Numbness and bilateral lower extremity weakness for a little more than 1 week. History of prostate cancer.  EXAM: MRI CERVICAL AND THORACIC SPINE WITHOUT CONTRAST  TECHNIQUE: Multiplanar and multiecho pulse sequences of the cervical and thoracic spine were obtained without intravenous contrast.  COMPARISON:  None.  FINDINGS: MR CERVICAL SPINE FINDINGS  The study is degraded by patient motion. The patient is status post C4-7 anterior discectomy and fusion. There is straightening of the normal cervical lordosis but vertebral body height and alignment are  maintained. The craniocervical junction is normal. Cervical cord signal is unremarkable. The patient has a congenitally narrow central spinal canal. Imaged paraspinous structures are unremarkable.  C2-3: Shallow disc bulge without central canal or foraminal narrowing.  C3-4: Disc osteophyte complex narrows the ventral thecal sac but the central canal and foramina appear open.  C4-5: Status post discectomy and fusion. The central canal is open. Uncovertebral spurring causes mild to moderate foraminal narrowing, more notable on the right.  C5-6: Status post discectomy and fusion. The central canal is patent. Uncovertebral spurring causes moderate to moderately severe foraminal narrowing, worse on the right.  C6-7: Status post discectomy and fusion. Endplate spurring and uncovertebral disease are seen. The ventral cord is mildly deformed. Moderately severe to severe bilateral foraminal narrowing is identified.  C7-T1: Shallow disc bulge endplate spur. The central canal is mildly narrowed. Moderate to moderately severe bilateral foraminal narrowing appears worse on the right.  MR THORACIC SPINE FINDINGS  Vertebral body height, signal and alignment are maintained. There is signal abnormality centrally within the cord from approximately the T3-4 level through the superior aspect of T5. The cord appears anteriorly positioned against the T4 vertebral body which is likely due to normal kyphosis. The thoracic cord is otherwise unremarkable. The central canal  and neural foramina appear patent at all levels. Small central protrusion at T8-9 and shallow right paracentral protrusion T9-10 are noted.  IMPRESSION: Signal abnormality within the thoracic cord from the T3-4 through upper T5 level is nonspecific. Main differential considerations are cord infarct versus transverse myelitis. The cord appears anteriorly positioned this level which is likely due to kyphosis rather than a ventral dural defect. Post-contrast and diffusion  imaging of the thoracic cord are recommended.  Status post C4-7 fusion. Uncovertebral and endplate spurring cause foraminal narrowing at the fusion levels. The cord is mildly deformed at the C6-7 level as described above.  Critical Value/emergent results were called by telephone at the time of interpretation on 08/12/2014 at 9:13 am to Dr. Alexis Goodell , who verbally acknowledged these results.   Electronically Signed   By: Inge Rise M.D.   On: 08/12/2014 09:16   Mr Thoracic Spine Wo Contrast  08/12/2014   CLINICAL DATA:  Numbness and bilateral lower extremity weakness for a little more than 1 week. History of prostate cancer.  EXAM: MRI CERVICAL AND THORACIC SPINE WITHOUT CONTRAST  TECHNIQUE: Multiplanar and multiecho pulse sequences of the cervical and thoracic spine were obtained without intravenous contrast.  COMPARISON:  None.  FINDINGS: MR CERVICAL SPINE FINDINGS  The study is degraded by patient motion. The patient is status post C4-7 anterior discectomy and fusion. There is straightening of the normal cervical lordosis but vertebral body height and alignment are maintained. The craniocervical junction is normal. Cervical cord signal is unremarkable. The patient has a congenitally narrow central spinal canal. Imaged paraspinous structures are unremarkable.  C2-3: Shallow disc bulge without central canal or foraminal narrowing.  C3-4: Disc osteophyte complex narrows the ventral thecal sac but the central canal and foramina appear open.  C4-5: Status post discectomy and fusion. The central canal is open. Uncovertebral spurring causes mild to moderate foraminal narrowing, more notable on the right.  C5-6: Status post discectomy and fusion. The central canal is patent. Uncovertebral spurring causes moderate to moderately severe foraminal narrowing, worse on the right.  C6-7: Status post discectomy and fusion. Endplate spurring and uncovertebral disease are seen. The ventral cord is mildly deformed.  Moderately severe to severe bilateral foraminal narrowing is identified.  C7-T1: Shallow disc bulge endplate spur. The central canal is mildly narrowed. Moderate to moderately severe bilateral foraminal narrowing appears worse on the right.  MR THORACIC SPINE FINDINGS  Vertebral body height, signal and alignment are maintained. There is signal abnormality centrally within the cord from approximately the T3-4 level through the superior aspect of T5. The cord appears anteriorly positioned against the T4 vertebral body which is likely due to normal kyphosis. The thoracic cord is otherwise unremarkable. The central canal and neural foramina appear patent at all levels. Small central protrusion at T8-9 and shallow right paracentral protrusion T9-10 are noted.  IMPRESSION: Signal abnormality within the thoracic cord from the T3-4 through upper T5 level is nonspecific. Main differential considerations are cord infarct versus transverse myelitis. The cord appears anteriorly positioned this level which is likely due to kyphosis rather than a ventral dural defect. Post-contrast and diffusion imaging of the thoracic cord are recommended.  Status post C4-7 fusion. Uncovertebral and endplate spurring cause foraminal narrowing at the fusion levels. The cord is mildly deformed at the C6-7 level as described above.  Critical Value/emergent results were called by telephone at the time of interpretation on 08/12/2014 at 9:13 am to Dr. Alexis Goodell , who verbally acknowledged these results.  Electronically Signed   By: Inge Rise M.D.   On: 08/12/2014 09:16   Mr Thoracic Spine W Contrast  08/12/2014   CLINICAL DATA:  Patient with history of prostate cancer. Acute onset of pain radiating from rectum to penis. On waking the next day, patient had numbness from waist down.  EXAM: MRI THORACIC SPINE WITH CONTRAST  TECHNIQUE: Multiplanar and multiecho pulse sequences of the thoracic spine were obtained with intravenous contrast.   CONTRAST:  16m MULTIHANCE GADOBENATE DIMEGLUMINE 529 MG/ML IV SOLN  COMPARISON:  08/12/2014 cervical and thoracic MRI.  FINDINGS: This study compliments the previous cervical and thoracic MRI. Counting was performed from the craniocervical junction. C4 through C7 ACDF. Bone marrow signal shows heterogenous marrow. This is a nonspecific finding most commonly associated with obesity, anemia, cigarette smoking or chronic disease. No enhancing aggressive osseous lesions are present. There is enhancement of the T1 superior endplate which is compatible with adjacent segment disease from the relatively long segment cervical fusion above. No findings to suggest bony metastatic disease.  Thoracic cord is swollen with increased intramedullary signal extending from T3-T4 through the mid T5 vertebra. This area demonstrates post gadolinium enhancement. No intramedullary hematoma. There is increased diffusion signal on diffusion-weighted imaging which could be associated with either infarct or transverse myelitis. No evidence of AVM. On the axial images, the cord enhancement is central in the lesion. No thoracic spinal stenosis.  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 Central enhancement after gadolinium administration with probable restricted diffusion. The differential considerations are unchanged and this pre and post gadolinium study does not establish a conclusive diagnosis. Most likely differential considerations are cord infarct versus transverse myelitis. Given these differential considerations, the clinical presentation would probably be more helpful in narrowing the differential diagnosis.   Electronically Signed   By: GDereck LigasM.D.   On: 08/12/2014 12:48   Mr Lumbar Spine W Wo Contrast  08/11/2014   CLINICAL DATA:  Numbness and tingling both legs. Prior back surgery.  EXAM: MRI LUMBAR SPINE WITHOUT AND WITH CONTRAST  TECHNIQUE: Multiplanar and multiecho pulse  sequences of the lumbar spine were obtained without and with intravenous contrast.  CONTRAST:  270mMULTIHANCE GADOBENATE DIMEGLUMINE 529 MG/ML IV SOLN  COMPARISON:  Lumbar radiographs 12/14/2007, CT myelogram 04/11/2004, lumbar MRI 03/24/2004  FINDINGS: Based on prior radiographs, 6 non-rib-bearing lumbar segments are present. The lowest of these will be labeled S1, consistent with prior MRI and myelogram. Lowest disc space is S1-S2.  Straightening of the lumbar lordosis. Negative for fracture or mass. Multilevel degenerative change in the lumbar spine with extensive bone marrow signal abnormalities compatible with disc degeneration. Conus medullaris is normal and terminates at L1-2.  L1-2:  Mild disc and mild facet degeneration without spinal stenosis  L2-3: Diffuse disc bulging and spondylosis. Prominent right lateral osteophyte and disc complex similar to 2006. Bilateral facet degeneration. Small right-sided disc protrusion unchanged from the prior MRI. Mild spinal stenosis.  L3-4: Disc degeneration and spondylosis with diffuse endplate osteophyte formation. Bilateral facet hypertrophy. Improvement in right-sided disc protrusion since the prior studies. Moderate spinal stenosis. Moderate facet hypertrophy bilaterally. Foraminal encroachment is present bilaterally  L4-5: Diffuse disc bulging and spondylosis. Moderate spinal stenosis. Subarticular stenosis bilaterally. No change from the prior studies  L5-S1: Prior laminotomy on the right. Disc degeneration and spurring right greater than left with subarticular and foraminal stenosis on the right. Moderate spinal stenosis unchanged from the prior study.  L5-S1: Disc and facet degeneration.  No significant disc protrusion or stenosis.  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  The lowest disc space is labeled L5-S1.  L2-3 disc and facet degeneration and mild spinal stenosis unchanged. Small right-sided disc protrusion L2-3 unchanged.  L3-4 disc and facet degeneration with  moderate spinal stenosis. Interval improvement in right-sided disc protrusion since 2006.  Moderate spinal stenosis L4-5 due to spondylosis and facet hypertrophy.  Moderate spinal stenosis L5-S1 unchanged from the prior study.   Electronically Signed   By: Franchot Gallo M.D.   On: 08/11/2014 18:54    Medications:  Scheduled: . finasteride  5 mg Oral Daily  . ipratropium-albuterol  3 mL Nebulization TID  . LORazepam      . pantoprazole  40 mg Oral Daily  . [START ON 08/13/2014] pneumococcal 23 valent vaccine  0.5 mL Intramuscular Tomorrow-1000  . sertraline  100 mg Oral Daily  . terazosin  5 mg Oral QHS    Assessment/Plan: 69 YO male with bilateral LE weakness for the past week that is stated to have abruptly started. 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 Central enhancement after gadolinium administration with probable restricted diffusion. IR was contacted to do a LP but due to MRI findings they did not feel there was an indication to do LP. 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 Will hold asiprin due to allergy. For this reason we will start him on Solumedrol and obtain CTA of abdomen and chest to rule out  A.  aneurysm.    Etta Quill PA-C [facility] [phone]  08/12/2014, 2:59 PM  Patient seen and evaluated. Agree with above assessment and plan.  Jim Like, DO [facility] [phone]  If 7pm- 7am, please page neurology on call as listed in [place].

## 2014-08-12 NOTE — Progress Notes (Signed)
Patient returned from MRI at 1215. Has not been seen by this Probation officer until now. Patient has been ito MRI before shift change.

## 2014-08-12 NOTE — Progress Notes (Signed)
NIF -22. Pt had good effort but was in and out of sleep. Pt is able to answer questions and follow commands, just tired at this time. RT will perform NIF in AM when pt more awake.

## 2014-08-12 NOTE — ED Notes (Addendum)
Time out done with MD and RN.  Two patient identifiers used.  Pt verbalized understanding of procedure before beginning.

## 2014-08-12 NOTE — Progress Notes (Signed)
NIF -36 x 2 attempts w/ fairly good effort.

## 2014-08-13 DIAGNOSIS — G839 Paralytic syndrome, unspecified: Secondary | ICD-10-CM | POA: Diagnosis not present

## 2014-08-13 DIAGNOSIS — K219 Gastro-esophageal reflux disease without esophagitis: Secondary | ICD-10-CM | POA: Diagnosis present

## 2014-08-13 DIAGNOSIS — N400 Enlarged prostate without lower urinary tract symptoms: Secondary | ICD-10-CM | POA: Diagnosis present

## 2014-08-13 DIAGNOSIS — J449 Chronic obstructive pulmonary disease, unspecified: Secondary | ICD-10-CM | POA: Diagnosis present

## 2014-08-13 DIAGNOSIS — I10 Essential (primary) hypertension: Secondary | ICD-10-CM | POA: Diagnosis present

## 2014-08-13 DIAGNOSIS — R531 Weakness: Secondary | ICD-10-CM | POA: Diagnosis not present

## 2014-08-13 DIAGNOSIS — J9611 Chronic respiratory failure with hypoxia: Secondary | ICD-10-CM | POA: Diagnosis present

## 2014-08-13 DIAGNOSIS — F17210 Nicotine dependence, cigarettes, uncomplicated: Secondary | ICD-10-CM | POA: Diagnosis present

## 2014-08-13 DIAGNOSIS — R208 Other disturbances of skin sensation: Secondary | ICD-10-CM | POA: Diagnosis not present

## 2014-08-13 DIAGNOSIS — Z8546 Personal history of malignant neoplasm of prostate: Secondary | ICD-10-CM | POA: Diagnosis not present

## 2014-08-13 DIAGNOSIS — Z79899 Other long term (current) drug therapy: Secondary | ICD-10-CM | POA: Diagnosis not present

## 2014-08-13 DIAGNOSIS — G373 Acute transverse myelitis in demyelinating disease of central nervous system: Secondary | ICD-10-CM | POA: Diagnosis present

## 2014-08-13 DIAGNOSIS — R202 Paresthesia of skin: Secondary | ICD-10-CM | POA: Diagnosis present

## 2014-08-13 LAB — GLUCOSE, CAPILLARY: Glucose-Capillary: 144 mg/dL — ABNORMAL HIGH (ref 65–99)

## 2014-08-13 LAB — RPR: RPR Ser Ql: NONREACTIVE

## 2014-08-13 LAB — HEMOGLOBIN A1C
HEMOGLOBIN A1C: 5.7 % — AB (ref 4.8–5.6)
Mean Plasma Glucose: 117 mg/dL

## 2014-08-13 LAB — HIV ANTIBODY (ROUTINE TESTING W REFLEX): HIV Screen 4th Generation wRfx: NONREACTIVE

## 2014-08-13 MED ORDER — POTASSIUM CHLORIDE CRYS ER 20 MEQ PO TBCR
40.0000 meq | EXTENDED_RELEASE_TABLET | Freq: Once | ORAL | Status: AC
  Administered 2014-08-13: 40 meq via ORAL
  Filled 2014-08-13: qty 2

## 2014-08-13 NOTE — Progress Notes (Signed)
[facility] PROGRESS NOTE  RIGGINS CISEK GYK:[id number] DOB: 28-Nov-1945 DOA: 08/11/2014 PCP: Default, Provider, MD  Summary  69 y.o. male with Past medical history of hypertension, GERD, prostate cancer, multiple back surgeries. The patient is presenting with numbness of bilateral lower leg numbness that has been ongoing since last one week.  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  - Neurology consulted and following. They're waiting for ANA and and NMO antibody -  plan is to continue IV Solu-Medrol for 5 days currently day 2 of 5  -  Plavix also started recently as patient has aspirin allergy  - PT on board and currently recommended CIR. We'll place consult   Active Problems: HTN  - Amlodipine currently on hold, and blood pressures relatively well controlled  -  stable place on low salt diet low sodium  GERD -Stable, continue PPI    Smoker -Plan will be to recommend tobacco cessation  Hyperkalemia -  Replace and reassess next am.  Code Status:  full Family Communication:   Disposition Plan:    Consultants:  Neuro  CIR  Procedures:   None  Antibiotics:  None  HPI/Subjective: Pt has no new complaints today. His IV is bothering him  Objective: Filed Vitals:   08/13/14 1422  BP: 129/56  Pulse: 106  Temp: 98.9 F (37.2 C)  Resp: 18    Intake/Output Summary (Last 24 hours) at 08/13/14 1456 Last data filed at 08/12/14 2304  Gross per 24 hour  Intake      0 ml  Output    250 ml  Net   -250 ml   Filed Weights   08/12/14 0220  Weight: 113.8 kg (250 lb 14.1 oz)    Exam:   General:  Awake and alert, in nad  Cardiovascular: RRR without MGR  Respiratory: CTA, no wheezes, equal chest rise  Abdomen: NT, ND, soft  Ext: no edema  Neuro:no facial asymmetry, answers questions appropriately  Basic Metabolic Panel:  Recent  Labs Lab 08/11/14 1400 08/12/14 0029 08/12/14 1515  NA 139 141 138  K 3.4* 3.6 3.3*  CL 106 106 102  CO2 '26 27 29  '$ GLUCOSE 106* 103* 131*  BUN '13 12 10  '$ CREATININE 1.04 0.87 0.92  CALCIUM 9.4 9.6 9.3   Liver Function Tests:  Recent Labs Lab 08/12/14 0029 08/12/14 1515  AST 17 18  ALT 15* 14*  ALKPHOS 53 52  BILITOT 0.8 0.6  PROT 7.3 7.0  ALBUMIN 3.8 3.6   No results for input(s): LIPASE, AMYLASE in the last 168 hours. No results for input(s): AMMONIA in the last 168 hours. CBC:  Recent Labs Lab 08/11/14 1400 08/12/14 0029  WBC 5.3 6.0  NEUTROABS 3.2  --   HGB 11.9* 12.2*  HCT 36.1* 35.9*  MCV 93.3 92.1  PLT 240 237   Cardiac Enzymes: No results for input(s): CKTOTAL, CKMB, CKMBINDEX, TROPONINI in the last 168 hours. BNP (last 3 results) No results for input(s): BNP in the last 8760 hours.  ProBNP (last 3 results) No results for input(s): PROBNP in the last 8760 hours.  CBG:  Recent Labs Lab 08/13/14 0608  GLUCAP 144*    No results found for this or any previous visit (from the past 240 hour(s)).   Studies: Dg Chest 2 View  08/11/2014   CLINICAL DATA:  Chronic shortness of breath. Chronic left upper extremity region  pain. Hypertension.  EXAM: CHEST  2 VIEW  COMPARISON:  May 25, 2010  FINDINGS: There is slight scarring in the right and left lower lobe regions. There is no edema or consolidation. Heart size and pulmonary vascularity are normal. No adenopathy. There is postoperative change in the lower cervical region.  IMPRESSION: Areas of mild scarring in the lower lobes bilaterally. No edema or consolidation.   Electronically Signed   By: Lowella Grip III M.D.   On: 08/11/2014 14:42   Ct Head Wo Contrast  08/12/2014   CLINICAL DATA:  Numb waistline for 5 days. History of hypertension and prostate cancer. Current smoker.  EXAM: CT HEAD WITHOUT CONTRAST  TECHNIQUE: Contiguous axial images were obtained from the base of the skull through the vertex  without intravenous contrast.  COMPARISON:  MRI of the head May 24, 2010  FINDINGS: The ventricles and sulci are normal for age. No intraparenchymal hemorrhage, mass effect nor midline shift. Patchy supratentorial white matter hypodensities are within normal range for patient's age and though non-specific suggest sequelae of chronic small vessel ischemic disease. No acute large vascular territory infarcts.  No abnormal extra-axial fluid collections. Basal cisterns are patent. Mild calcific atherosclerosis of the carotid siphons.  No skull fracture. The included ocular globes and orbital contents are non-suspicious. Moderate paranasal sinus mucosal thickening without air-fluid levels. The mastoid air cells are well aerated.  IMPRESSION: No acute intracranial process.  Involutional changes. Moderate white matter changes compatible with chronic small vessel ischemic disease.   Electronically Signed   By: Elon Alas M.D.   On: 08/12/2014 01:24   Mr Cervical Spine Wo Contrast  08/12/2014   CLINICAL DATA:  Numbness and bilateral lower extremity weakness for a little more than 1 week. History of prostate cancer.  EXAM: MRI CERVICAL AND THORACIC SPINE WITHOUT CONTRAST  TECHNIQUE: Multiplanar and multiecho pulse sequences of the cervical and thoracic spine were obtained without intravenous contrast.  COMPARISON:  None.  FINDINGS: MR CERVICAL SPINE FINDINGS  The study is degraded by patient motion. The patient is status post C4-7 anterior discectomy and fusion. There is straightening of the normal cervical lordosis but vertebral body height and alignment are maintained. The craniocervical junction is normal. Cervical cord signal is unremarkable. The patient has a congenitally narrow central spinal canal. Imaged paraspinous structures are unremarkable.  C2-3: Shallow disc bulge without central canal or foraminal narrowing.  C3-4: Disc osteophyte complex narrows the ventral thecal sac but the central canal and foramina  appear open.  C4-5: Status post discectomy and fusion. The central canal is open. Uncovertebral spurring causes mild to moderate foraminal narrowing, more notable on the right.  C5-6: Status post discectomy and fusion. The central canal is patent. Uncovertebral spurring causes moderate to moderately severe foraminal narrowing, worse on the right.  C6-7: Status post discectomy and fusion. Endplate spurring and uncovertebral disease are seen. The ventral cord is mildly deformed. Moderately severe to severe bilateral foraminal narrowing is identified.  C7-T1: Shallow disc bulge endplate spur. The central canal is mildly narrowed. Moderate to moderately severe bilateral foraminal narrowing appears worse on the right.  MR THORACIC SPINE FINDINGS  Vertebral body height, signal and alignment are maintained. There is signal abnormality centrally within the cord from approximately the T3-4 level through the superior aspect of T5. The cord appears anteriorly positioned against the T4 vertebral body which is likely due to normal kyphosis. The thoracic cord is otherwise unremarkable. The central canal and neural foramina appear patent at all  levels. Small central protrusion at T8-9 and shallow right paracentral protrusion T9-10 are noted.  IMPRESSION: Signal abnormality within the thoracic cord from the T3-4 through upper T5 level is nonspecific. Main differential considerations are cord infarct versus transverse myelitis. The cord appears anteriorly positioned this level which is likely due to kyphosis rather than a ventral dural defect. Post-contrast and diffusion imaging of the thoracic cord are recommended.  Status post C4-7 fusion. Uncovertebral and endplate spurring cause foraminal narrowing at the fusion levels. The cord is mildly deformed at the C6-7 level as described above.  Critical Value/emergent results were called by telephone at the time of interpretation on 08/12/2014 at 9:13 am to Dr. Alexis Goodell , who  verbally acknowledged these results.   Electronically Signed   By: Inge Rise M.D.   On: 08/12/2014 09:16   Mr Thoracic Spine Wo Contrast  08/12/2014   CLINICAL DATA:  Numbness and bilateral lower extremity weakness for a little more than 1 week. History of prostate cancer.  EXAM: MRI CERVICAL AND THORACIC SPINE WITHOUT CONTRAST  TECHNIQUE: Multiplanar and multiecho pulse sequences of the cervical and thoracic spine were obtained without intravenous contrast.  COMPARISON:  None.  FINDINGS: MR CERVICAL SPINE FINDINGS  The study is degraded by patient motion. The patient is status post C4-7 anterior discectomy and fusion. There is straightening of the normal cervical lordosis but vertebral body height and alignment are maintained. The craniocervical junction is normal. Cervical cord signal is unremarkable. The patient has a congenitally narrow central spinal canal. Imaged paraspinous structures are unremarkable.  C2-3: Shallow disc bulge without central canal or foraminal narrowing.  C3-4: Disc osteophyte complex narrows the ventral thecal sac but the central canal and foramina appear open.  C4-5: Status post discectomy and fusion. The central canal is open. Uncovertebral spurring causes mild to moderate foraminal narrowing, more notable on the right.  C5-6: Status post discectomy and fusion. The central canal is patent. Uncovertebral spurring causes moderate to moderately severe foraminal narrowing, worse on the right.  C6-7: Status post discectomy and fusion. Endplate spurring and uncovertebral disease are seen. The ventral cord is mildly deformed. Moderately severe to severe bilateral foraminal narrowing is identified.  C7-T1: Shallow disc bulge endplate spur. The central canal is mildly narrowed. Moderate to moderately severe bilateral foraminal narrowing appears worse on the right.  MR THORACIC SPINE FINDINGS  Vertebral body height, signal and alignment are maintained. There is signal abnormality  centrally within the cord from approximately the T3-4 level through the superior aspect of T5. The cord appears anteriorly positioned against the T4 vertebral body which is likely due to normal kyphosis. The thoracic cord is otherwise unremarkable. The central canal and neural foramina appear patent at all levels. Small central protrusion at T8-9 and shallow right paracentral protrusion T9-10 are noted.  IMPRESSION: Signal abnormality within the thoracic cord from the T3-4 through upper T5 level is nonspecific. Main differential considerations are cord infarct versus transverse myelitis. The cord appears anteriorly positioned this level which is likely due to kyphosis rather than a ventral dural defect. Post-contrast and diffusion imaging of the thoracic cord are recommended.  Status post C4-7 fusion. Uncovertebral and endplate spurring cause foraminal narrowing at the fusion levels. The cord is mildly deformed at the C6-7 level as described above.  Critical Value/emergent results were called by telephone at the time of interpretation on 08/12/2014 at 9:13 am to Dr. Alexis Goodell , who verbally acknowledged these results.   Electronically Signed   By:  Inge Rise M.D.   On: 08/12/2014 09:16   Mr Thoracic Spine W Contrast  08/12/2014   CLINICAL DATA:  Patient with history of prostate cancer. Acute onset of pain radiating from rectum to penis. On waking the next day, patient had numbness from waist down.  EXAM: MRI THORACIC SPINE WITH CONTRAST  TECHNIQUE: Multiplanar and multiecho pulse sequences of the thoracic spine were obtained with intravenous contrast.  CONTRAST:  38m MULTIHANCE GADOBENATE DIMEGLUMINE 529 MG/ML IV SOLN  COMPARISON:  08/12/2014 cervical and thoracic MRI.  FINDINGS: This study compliments the previous cervical and thoracic MRI. Counting was performed from the craniocervical junction. C4 through C7 ACDF. Bone marrow signal shows heterogenous marrow. This is a nonspecific finding most  commonly associated with obesity, anemia, cigarette smoking or chronic disease. No enhancing aggressive osseous lesions are present. There is enhancement of the T1 superior endplate which is compatible with adjacent segment disease from the relatively long segment cervical fusion above. No findings to suggest bony metastatic disease.  Thoracic cord is swollen with increased intramedullary signal extending from T3-T4 through the mid T5 vertebra. This area demonstrates post gadolinium enhancement. No intramedullary hematoma. There is increased diffusion signal on diffusion-weighted imaging which could be associated with either infarct or transverse myelitis. No evidence of AVM. On the axial images, the cord enhancement is central in the lesion. No thoracic spinal stenosis.  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 Central enhancement after gadolinium administration with probable restricted diffusion. The differential considerations are unchanged and this pre and post gadolinium study does not establish a conclusive diagnosis. Most likely differential considerations are cord infarct versus transverse myelitis. Given these differential considerations, the clinical presentation would probably be more helpful in narrowing the differential diagnosis.   Electronically Signed   By: GDereck LigasM.D.   On: 08/12/2014 12:48   Mr Lumbar Spine W Wo Contrast  08/11/2014   CLINICAL DATA:  Numbness and tingling both legs. Prior back surgery.  EXAM: MRI LUMBAR SPINE WITHOUT AND WITH CONTRAST  TECHNIQUE: Multiplanar and multiecho pulse sequences of the lumbar spine were obtained without and with intravenous contrast.  CONTRAST:  2110mMULTIHANCE GADOBENATE DIMEGLUMINE 529 MG/ML IV SOLN  COMPARISON:  Lumbar radiographs 12/14/2007, CT myelogram 04/11/2004, lumbar MRI 03/24/2004  FINDINGS: Based on prior radiographs, 6 non-rib-bearing lumbar segments are present. The lowest of these  will be labeled S1, consistent with prior MRI and myelogram. Lowest disc space is S1-S2.  Straightening of the lumbar lordosis. Negative for fracture or mass. Multilevel degenerative change in the lumbar spine with extensive bone marrow signal abnormalities compatible with disc degeneration. Conus medullaris is normal and terminates at L1-2.  L1-2:  Mild disc and mild facet degeneration without spinal stenosis  L2-3: Diffuse disc bulging and spondylosis. Prominent right lateral osteophyte and disc complex similar to 2006. Bilateral facet degeneration. Small right-sided disc protrusion unchanged from the prior MRI. Mild spinal stenosis.  L3-4: Disc degeneration and spondylosis with diffuse endplate osteophyte formation. Bilateral facet hypertrophy. Improvement in right-sided disc protrusion since the prior studies. Moderate spinal stenosis. Moderate facet hypertrophy bilaterally. Foraminal encroachment is present bilaterally  L4-5: Diffuse disc bulging and spondylosis. Moderate spinal stenosis. Subarticular stenosis bilaterally. No change from the prior studies  L5-S1: Prior laminotomy on the right. Disc degeneration and spurring right greater than left with subarticular and foraminal stenosis on the right. Moderate spinal stenosis unchanged from the prior study.  L5-S1: Disc and facet degeneration. No significant disc protrusion or  stenosis.  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  The lowest disc space is labeled L5-S1.  L2-3 disc and facet degeneration and mild spinal stenosis unchanged. Small right-sided disc protrusion L2-3 unchanged.  L3-4 disc and facet degeneration with moderate spinal stenosis. Interval improvement in right-sided disc protrusion since 2006.  Moderate spinal stenosis L4-5 due to spondylosis and facet hypertrophy.  Moderate spinal stenosis L5-S1 unchanged from the prior study.   Electronically Signed   By: Franchot Gallo M.D.   On: 08/11/2014 18:54    Scheduled Meds: . finasteride  5 mg Oral Daily   . ipratropium-albuterol  3 mL Nebulization TID  . methylPREDNISolone (SOLU-MEDROL) injection  1,000 mg Intravenous Daily  . pantoprazole  40 mg Oral Daily  . pneumococcal 23 valent vaccine  0.5 mL Intramuscular Tomorrow-1000  . sertraline  100 mg Oral Daily  . terazosin  5 mg Oral QHS   Continuous Infusions:   Time spent: 25 minutes  Lylith Bebeau, [facility]  [facility] [email or web address], password [facility] 08/13/2014, 2:56 PM

## 2014-08-13 NOTE — Progress Notes (Signed)
Subjective: No overnight events. Denies any improvement in lower extremity weakness. CT angiogram negative for aortic dissection  Lab workup so far unremarkable. Pending ANA and NMO antibody  Objective: Current vital signs: BP 134/80 mmHg  Pulse 109  Temp(Src) 97.9 F (36.6 C) (Oral)  Resp 18  Ht '6\' 2"'$  (1.88 m)  Wt 113.8 kg (250 lb 14.1 oz)  BMI 32.20 kg/m2  SpO2 92% Vital signs in last 24 hours: Temp:  [97.9 F (36.6 C)-98.6 F (37 C)] 97.9 F (36.6 C) (08/06 1761) Pulse Rate:  [87-109] 109 (08/06 0613) Resp:  [18-20] 18 (08/06 0613) BP: (125-142)/(72-81) 134/80 mmHg (08/06 0613) SpO2:  [90 %-94 %] 92 % (08/06 0613)  Intake/Output from previous day: 08/05 0701 - 08/06 0700 In: -  Out: 250 [Urine:250] Intake/Output this shift:   Nutritional status: Diet Heart Room service appropriate?: Yes; Fluid consistency:: Thin  Neurologic Exam: Mental Status: Alert, oriented, thought content appropriate. Speech fluent without evidence of aphasia. Able to follow 3 step commands without difficulty. Cranial Nerves: II: Able to appreciate visual stimuli in all visual fields, pupils equal, round, reactive to light and accommodation III,IV, VI: ptosis not present, extra-ocular motions intact bilaterally V,VII: decrease in left NLF, facial light touch sensation normal bilaterally VIII: hearing normal bilaterally IX,X: gag reflex present XI: bilateral shoulder shrug XII: midline tongue extension Motor: Right :Upper extremity 4/5Left: Upper extremity 5/5 Lower extremity 4-/5Lower extremity 4-/5 Tone and bulk:normal tone throughout; no atrophy noted Sensory: Pinprick and light touch decreased to below the umbilicus, no proprioception or vibratory sensation in feet.   Deep Tendon Reflexes: 2+ and symmetric in the upper extremities, 1+ right knee jerk, absent left knee jerk,  absent AJ's bilaterally Plantars: Right: downgoingLeft: downgoing  Lab Results: Basic Metabolic Panel:  Recent Labs Lab 08/11/14 1400 08/12/14 0029 08/12/14 1515  NA 139 141 138  K 3.4* 3.6 3.3*  CL 106 106 102  CO2 '26 27 29  '$ GLUCOSE 106* 103* 131*  BUN '13 12 10  '$ CREATININE 1.04 0.87 0.92  CALCIUM 9.4 9.6 9.3    Liver Function Tests:  Recent Labs Lab 08/12/14 0029 08/12/14 1515  AST 17 18  ALT 15* 14*  ALKPHOS 53 52  BILITOT 0.8 0.6  PROT 7.3 7.0  ALBUMIN 3.8 3.6   No results for input(s): LIPASE, AMYLASE in the last 168 hours. No results for input(s): AMMONIA in the last 168 hours.  CBC:  Recent Labs Lab 08/11/14 1400 08/12/14 0029  WBC 5.3 6.0  NEUTROABS 3.2  --   HGB 11.9* 12.2*  HCT 36.1* 35.9*  MCV 93.3 92.1  PLT 240 237    Cardiac Enzymes: No results for input(s): CKTOTAL, CKMB, CKMBINDEX, TROPONINI in the last 168 hours.  Lipid Panel:  Recent Labs Lab 08/12/14 0532  CHOL 156  TRIG 167*  HDL 31*  CHOLHDL 5.0  VLDL 33  LDLCALC 92    CBG:  Recent Labs Lab 08/13/14 0608  GLUCAP 144*    Microbiology: Results for orders placed or performed during the hospital encounter of 05/23/10  Urine culture     Status: None   Collection Time: 05/25/10  8:33 AM  Result Value Ref Range Status   Specimen Description URINE, CLEAN CATCH  Final   Special Requests NONE  Final   Culture  Setup Time [id number]  Final   Colony Count NO GROWTH  Final   Culture NO GROWTH  Final   Report Status 05/26/2010 FINAL  Final    Coagulation Studies:  Recent Labs  08/12/14 0029  LABPROT 14.3  INR 1.09    Imaging: Dg Chest 2 View  08/11/2014   CLINICAL DATA:  Chronic shortness of breath. Chronic left upper extremity region pain. Hypertension.  EXAM: CHEST  2 VIEW  COMPARISON:  May 25, 2010  FINDINGS: There is slight scarring in the right and left lower lobe regions. There is no edema or consolidation. Heart size and  pulmonary vascularity are normal. No adenopathy. There is postoperative change in the lower cervical region.  IMPRESSION: Areas of mild scarring in the lower lobes bilaterally. No edema or consolidation.   Electronically Signed   By: Lowella Grip III M.D.   On: 08/11/2014 14:42   Ct Head Wo Contrast  08/12/2014   CLINICAL DATA:  Numb waistline for 5 days. History of hypertension and prostate cancer. Current smoker.  EXAM: CT HEAD WITHOUT CONTRAST  TECHNIQUE: Contiguous axial images were obtained from the base of the skull through the vertex without intravenous contrast.  COMPARISON:  MRI of the head May 24, 2010  FINDINGS: The ventricles and sulci are normal for age. No intraparenchymal hemorrhage, mass effect nor midline shift. Patchy supratentorial white matter hypodensities are within normal range for patient's age and though non-specific suggest sequelae of chronic small vessel ischemic disease. No acute large vascular territory infarcts.  No abnormal extra-axial fluid collections. Basal cisterns are patent. Mild calcific atherosclerosis of the carotid siphons.  No skull fracture. The included ocular globes and orbital contents are non-suspicious. Moderate paranasal sinus mucosal thickening without air-fluid levels. The mastoid air cells are well aerated.  IMPRESSION: No acute intracranial process.  Involutional changes. Moderate white matter changes compatible with chronic small vessel ischemic disease.   Electronically Signed   By: Elon Alas M.D.   On: 08/12/2014 01:24   Mr Cervical Spine Wo Contrast  08/12/2014   CLINICAL DATA:  Numbness and bilateral lower extremity weakness for a little more than 1 week. History of prostate cancer.  EXAM: MRI CERVICAL AND THORACIC SPINE WITHOUT CONTRAST  TECHNIQUE: Multiplanar and multiecho pulse sequences of the cervical and thoracic spine were obtained without intravenous contrast.  COMPARISON:  None.  FINDINGS: MR CERVICAL SPINE FINDINGS  The study is  degraded by patient motion. The patient is status post C4-7 anterior discectomy and fusion. There is straightening of the normal cervical lordosis but vertebral body height and alignment are maintained. The craniocervical junction is normal. Cervical cord signal is unremarkable. The patient has a congenitally narrow central spinal canal. Imaged paraspinous structures are unremarkable.  C2-3: Shallow disc bulge without central canal or foraminal narrowing.  C3-4: Disc osteophyte complex narrows the ventral thecal sac but the central canal and foramina appear open.  C4-5: Status post discectomy and fusion. The central canal is open. Uncovertebral spurring causes mild to moderate foraminal narrowing, more notable on the right.  C5-6: Status post discectomy and fusion. The central canal is patent. Uncovertebral spurring causes moderate to moderately severe foraminal narrowing, worse on the right.  C6-7: Status post discectomy and fusion. Endplate spurring and uncovertebral disease are seen. The ventral cord is mildly deformed. Moderately severe to severe bilateral foraminal narrowing is identified.  C7-T1: Shallow disc bulge endplate spur. The central canal is mildly narrowed. Moderate to moderately severe bilateral foraminal narrowing appears worse on the right.  MR THORACIC SPINE FINDINGS  Vertebral body height, signal and alignment are maintained. There is signal abnormality centrally within the cord from approximately the T3-4 level through the superior aspect  of T5. The cord appears anteriorly positioned against the T4 vertebral body which is likely due to normal kyphosis. The thoracic cord is otherwise unremarkable. The central canal and neural foramina appear patent at all levels. Small central protrusion at T8-9 and shallow right paracentral protrusion T9-10 are noted.  IMPRESSION: Signal abnormality within the thoracic cord from the T3-4 through upper T5 level is nonspecific. Main differential considerations  are cord infarct versus transverse myelitis. The cord appears anteriorly positioned this level which is likely due to kyphosis rather than a ventral dural defect. Post-contrast and diffusion imaging of the thoracic cord are recommended.  Status post C4-7 fusion. Uncovertebral and endplate spurring cause foraminal narrowing at the fusion levels. The cord is mildly deformed at the C6-7 level as described above.  Critical Value/emergent results were called by telephone at the time of interpretation on 08/12/2014 at 9:13 am to Dr. Alexis Goodell , who verbally acknowledged these results.   Electronically Signed   By: Inge Rise M.D.   On: 08/12/2014 09:16   Mr Thoracic Spine Wo Contrast  08/12/2014   CLINICAL DATA:  Numbness and bilateral lower extremity weakness for a little more than 1 week. History of prostate cancer.  EXAM: MRI CERVICAL AND THORACIC SPINE WITHOUT CONTRAST  TECHNIQUE: Multiplanar and multiecho pulse sequences of the cervical and thoracic spine were obtained without intravenous contrast.  COMPARISON:  None.  FINDINGS: MR CERVICAL SPINE FINDINGS  The study is degraded by patient motion. The patient is status post C4-7 anterior discectomy and fusion. There is straightening of the normal cervical lordosis but vertebral body height and alignment are maintained. The craniocervical junction is normal. Cervical cord signal is unremarkable. The patient has a congenitally narrow central spinal canal. Imaged paraspinous structures are unremarkable.  C2-3: Shallow disc bulge without central canal or foraminal narrowing.  C3-4: Disc osteophyte complex narrows the ventral thecal sac but the central canal and foramina appear open.  C4-5: Status post discectomy and fusion. The central canal is open. Uncovertebral spurring causes mild to moderate foraminal narrowing, more notable on the right.  C5-6: Status post discectomy and fusion. The central canal is patent. Uncovertebral spurring causes moderate to  moderately severe foraminal narrowing, worse on the right.  C6-7: Status post discectomy and fusion. Endplate spurring and uncovertebral disease are seen. The ventral cord is mildly deformed. Moderately severe to severe bilateral foraminal narrowing is identified.  C7-T1: Shallow disc bulge endplate spur. The central canal is mildly narrowed. Moderate to moderately severe bilateral foraminal narrowing appears worse on the right.  MR THORACIC SPINE FINDINGS  Vertebral body height, signal and alignment are maintained. There is signal abnormality centrally within the cord from approximately the T3-4 level through the superior aspect of T5. The cord appears anteriorly positioned against the T4 vertebral body which is likely due to normal kyphosis. The thoracic cord is otherwise unremarkable. The central canal and neural foramina appear patent at all levels. Small central protrusion at T8-9 and shallow right paracentral protrusion T9-10 are noted.  IMPRESSION: Signal abnormality within the thoracic cord from the T3-4 through upper T5 level is nonspecific. Main differential considerations are cord infarct versus transverse myelitis. The cord appears anteriorly positioned this level which is likely due to kyphosis rather than a ventral dural defect. Post-contrast and diffusion imaging of the thoracic cord are recommended.  Status post C4-7 fusion. Uncovertebral and endplate spurring cause foraminal narrowing at the fusion levels. The cord is mildly deformed at the C6-7 level as described above.  Critical Value/emergent results were called by telephone at the time of interpretation on 08/12/2014 at 9:13 am to Dr. Alexis Goodell , who verbally acknowledged these results.   Electronically Signed   By: Inge Rise M.D.   On: 08/12/2014 09:16   Mr Thoracic Spine W Contrast  08/12/2014   CLINICAL DATA:  Patient with history of prostate cancer. Acute onset of pain radiating from rectum to penis. On waking the next day,  patient had numbness from waist down.  EXAM: MRI THORACIC SPINE WITH CONTRAST  TECHNIQUE: Multiplanar and multiecho pulse sequences of the thoracic spine were obtained with intravenous contrast.  CONTRAST:  10m MULTIHANCE GADOBENATE DIMEGLUMINE 529 MG/ML IV SOLN  COMPARISON:  08/12/2014 cervical and thoracic MRI.  FINDINGS: This study compliments the previous cervical and thoracic MRI. Counting was performed from the craniocervical junction. C4 through C7 ACDF. Bone marrow signal shows heterogenous marrow. This is a nonspecific finding most commonly associated with obesity, anemia, cigarette smoking or chronic disease. No enhancing aggressive osseous lesions are present. There is enhancement of the T1 superior endplate which is compatible with adjacent segment disease from the relatively long segment cervical fusion above. No findings to suggest bony metastatic disease.  Thoracic cord is swollen with increased intramedullary signal extending from T3-T4 through the mid T5 vertebra. This area demonstrates post gadolinium enhancement. No intramedullary hematoma. There is increased diffusion signal on diffusion-weighted imaging which could be associated with either infarct or transverse myelitis. No evidence of AVM. On the axial images, the cord enhancement is central in the lesion. No thoracic spinal stenosis.  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 Central enhancement after gadolinium administration with probable restricted diffusion. The differential considerations are unchanged and this pre and post gadolinium study does not establish a conclusive diagnosis. Most likely differential considerations are cord infarct versus transverse myelitis. Given these differential considerations, the clinical presentation would probably be more helpful in narrowing the differential diagnosis.   Electronically Signed   By: GDereck LigasM.D.   On: 08/12/2014 12:48   Mr Lumbar  Spine W Wo Contrast  08/11/2014   CLINICAL DATA:  Numbness and tingling both legs. Prior back surgery.  EXAM: MRI LUMBAR SPINE WITHOUT AND WITH CONTRAST  TECHNIQUE: Multiplanar and multiecho pulse sequences of the lumbar spine were obtained without and with intravenous contrast.  CONTRAST:  255mMULTIHANCE GADOBENATE DIMEGLUMINE 529 MG/ML IV SOLN  COMPARISON:  Lumbar radiographs 12/14/2007, CT myelogram 04/11/2004, lumbar MRI 03/24/2004  FINDINGS: Based on prior radiographs, 6 non-rib-bearing lumbar segments are present. The lowest of these will be labeled S1, consistent with prior MRI and myelogram. Lowest disc space is S1-S2.  Straightening of the lumbar lordosis. Negative for fracture or mass. Multilevel degenerative change in the lumbar spine with extensive bone marrow signal abnormalities compatible with disc degeneration. Conus medullaris is normal and terminates at L1-2.  L1-2:  Mild disc and mild facet degeneration without spinal stenosis  L2-3: Diffuse disc bulging and spondylosis. Prominent right lateral osteophyte and disc complex similar to 2006. Bilateral facet degeneration. Small right-sided disc protrusion unchanged from the prior MRI. Mild spinal stenosis.  L3-4: Disc degeneration and spondylosis with diffuse endplate osteophyte formation. Bilateral facet hypertrophy. Improvement in right-sided disc protrusion since the prior studies. Moderate spinal stenosis. Moderate facet hypertrophy bilaterally. Foraminal encroachment is present bilaterally  L4-5: Diffuse disc bulging and spondylosis. Moderate spinal stenosis. Subarticular stenosis bilaterally. No change from the prior studies  L5-S1: Prior laminotomy on the right. Disc  degeneration and spurring right greater than left with subarticular and foraminal stenosis on the right. Moderate spinal stenosis unchanged from the prior study.  L5-S1: Disc and facet degeneration. No significant disc protrusion or stenosis.  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  The  lowest disc space is labeled L5-S1.  L2-3 disc and facet degeneration and mild spinal stenosis unchanged. Small right-sided disc protrusion L2-3 unchanged.  L3-4 disc and facet degeneration with moderate spinal stenosis. Interval improvement in right-sided disc protrusion since 2006.  Moderate spinal stenosis L4-5 due to spondylosis and facet hypertrophy.  Moderate spinal stenosis L5-S1 unchanged from the prior study.   Electronically Signed   By: Franchot Gallo M.D.   On: 08/11/2014 18:54    Medications:  Scheduled: . finasteride  5 mg Oral Daily  . ipratropium-albuterol  3 mL Nebulization TID  . methylPREDNISolone (SOLU-MEDROL) injection  1,000 mg Intravenous Daily  . pantoprazole  40 mg Oral Daily  . pneumococcal 23 valent vaccine  0.5 mL Intramuscular Tomorrow-1000  . sertraline  100 mg Oral Daily  . terazosin  5 mg Oral QHS    Assessment/Plan: 69 YO male with bilateral LE weakness for the past week that is stated to have abruptly started. 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 Central enhancement after gadolinium administration with probable restricted diffusion. LP attempted at bedside was unsuccessful. IR was contacted to do a LP but due to MRI findings they did not feel there was an indication to do LP.  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  -continue IV solumedrol x 5 days (current day 2/5) -start Plavix '75mg'$  daily (has ASA allergy, no known plavix allergy per discussion with patient) -rehab evaluation -check lipid panel and Hemoglobin A1c -will continue to follow  08/13/2014, 7:51 AM  Jim Like, DO [facility] [phone]  If 7pm- 7am, please page neurology on call as listed in [place].

## 2014-08-13 NOTE — Care Management Note (Signed)
Case Management Note  Patient Details  Name: Jeremy Landry MRN: [id number] Date of Birth: 1945/01/26  Subjective/Objective:                    Action/Plan:   Expected Discharge Date:                  Expected Discharge Plan:     In-House Referral:     Discharge planning Services     Post Acute Care Choice:    Choice offered to:     DME Arranged:    DME Agency:     HH Arranged:    [place] Agency:     Status of Service:     Medicare Important Message Given:   yes Date Medicare IM Given:   08/13/14 Medicare IM give by:   Frann Rider, RN, BSN Date Additional Medicare IM Given:    Additional Medicare Important Message give by:     If discussed at [place] of Stay Meetings, dates discussed:    Additional Comments:  Norina Buzzard, RN 08/13/2014, 11:45 AM

## 2014-08-13 NOTE — Evaluation (Signed)
Occupational Therapy Evaluation Patient Details Name: Jeremy Landry MRN: [id number] DOB: 07-24-1945 Today's Date: 08/13/2014    History of Present Illness 69 YO male with bilateral LE weakness for the past week that is stated to have abruptly started. 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    Clinical Impression   Pt admitted with above. He demonstrates the below listed deficits and will benefit from continued OT to maximize safety and independence with BADLs. Currently, pt requires mod A for LB ADLs and functional transfers.   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 - he demonstrates claw hand and would benefit from splinting of Rt UE to improve alignment and function of Rt. UE.   Feel he would benefit from CIR.       Follow Up Recommendations  CIR;Supervision/Assistance - 24 hour    Equipment Recommendations  3 in 1 bedside comode;Tub/shower bench    Recommendations for Other Services Rehab consult     Precautions / Restrictions Precautions Precautions: Fall      Mobility Bed Mobility                  Transfers Overall transfer level: Needs assistance Equipment used: Rolling walker (2 wheeled) Transfers: Sit to/from Stand Sit to Stand: Mod assist         General transfer comment: Pt requires mod  A to power up into standing.  He tends to hyperextend knees and demonstrates posterior bias causing LOB    Balance Overall balance assessment: Needs assistance Sitting-balance support: Feet supported Sitting balance-Leahy Scale: Good Sitting balance - Comments: able to don/doff socks seated    Standing balance support: Bilateral upper extremity supported Standing balance-Leahy Scale: Poor                              ADL Overall ADL's : Needs assistance/impaired Eating/Feeding: Modified independent;Sitting    Grooming: Wash/dry hands;Wash/dry face;Oral care;Brushing hair;Set up;Sitting   Upper Body Bathing: Set up;Sitting   Lower Body Bathing: Moderate assistance;Sit to/from stand   Upper Body Dressing : Set up;Sitting   Lower Body Dressing: Moderate assistance;Sit to/from stand Lower Body Dressing Details (indicate cue type and reason): Pt able to don/doff socks seated, but requires mod A for standing balance  Toilet Transfer: Moderate assistance;Stand-pivot;RW;BSC           Functional mobility during ADLs: Moderate assistance;Rolling walker General ADL Comments: Pt very motivated to improve      Vision     Perception     Praxis      Pertinent Vitals/Pain Pain Assessment: Faces Faces Pain Scale: Hurts a little bit Pain Location: legs  Pain Descriptors / Indicators: Discomfort;Grimacing Pain Intervention(s): Monitored during session     Hand Dominance Right   Extremity/Trunk Assessment Upper Extremity Assessment Upper Extremity Assessment: RUE deficits/detail;LUE deficits/detail RUE Deficits / Details:  strength grossly 4/5 Pt with "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  RUE Sensation: decreased light touch RUE Coordination: decreased fine motor;decreased gross motor LUE Deficits / Details: grossly 4/5    Lower Extremity Assessment Lower Extremity Assessment: Defer to PT evaluation       Communication Communication Communication: No difficulties   Cognition Arousal/Alertness: Awake/alert Behavior During Therapy: WFL for tasks assessed/performed Overall Cognitive Status: Within Functional Limits  for tasks assessed                     General Comments       Exercises       Shoulder Instructions      Home Living Family/patient expects to be discharged to:: Private residence Living Arrangements: Other relatives (brother; physically okay, however has caner) Available Help at  Discharge: Family;Available 24 hours/day Type of Home: House Home Access: Stairs to enter [employer] of Steps: 4 Entrance Stairs-Rails: None Home Layout: One level     Bathroom Shower/Tub: Tub/shower unit Shower/tub characteristics: Curtain Biochemist, clinical: Standard Bathroom Accessibility: Yes   Home Equipment: Cane - single point;Walker - 2 wheels          Prior Functioning/Environment Level of Independence: Independent with assistive device(s)        Comments: Pt reports he had started using SPC intermittently.  He was mod I with ambulation, ADLs, driving, community activities and IADLs    OT Diagnosis: Generalized weakness;Acute pain;Paresis   OT Problem List: Decreased strength;Decreased activity tolerance;Impaired balance (sitting and/or standing);Decreased coordination;Decreased safety awareness;Decreased knowledge of use of DME or AE;Decreased knowledge of precautions;Cardiopulmonary status limiting activity;Impaired UE functional use;Pain;Impaired sensation   OT Treatment/Interventions: Self-care/ADL training;Neuromuscular education;DME and/or AE instruction;Therapeutic activities;Visual/perceptual remediation/compensation;Patient/family education;Balance training    OT Goals(Current goals can be found in the care plan section) Acute Rehab OT Goals Patient Stated Goal: to get legs working  OT Goal Formulation: With patient Time For Goal Achievement: 08/27/14 Potential to Achieve Goals: Good ADL Goals Pt Will Perform Grooming: with min guard assist;standing Pt Will Perform Lower Body Bathing: with min guard assist;sit to/from stand Pt Will Perform Lower Body Dressing: with min guard assist;sit to/from stand Pt Will Transfer to Toilet: with min guard assist;ambulating;bedside commode;grab bars;regular height toilet Pt Will Perform Toileting - Clothing Manipulation and hygiene: with min guard assist;sit to/from stand Pt/caregiver will Perform Home Exercise  Program: Increased strength;Right Upper extremity;Left upper extremity;Independently;With written HEP provided;With theraband Additional ADL Goal #1: Pt will be independent with splint wear for Rt UE.   OT Frequency: Min 3X/week   Barriers to D/C: Decreased caregiver support          Co-evaluation              End of Session Equipment Utilized During Treatment: Rolling walker;Oxygen Nurse Communication: Mobility status  Activity Tolerance: Patient tolerated treatment well Patient left: in chair;with call bell/phone within reach;with chair alarm set;with family/visitor present   Time: 1515-1540 OT Time Calculation (min): 25 min Charges:  OT General Charges $OT Visit: 1 Procedure OT Evaluation $Initial OT Evaluation Tier I: 1 Procedure OT Treatments $Therapeutic Activity: 8-22 mins G-Codes:    Harshan Kearley M Sep 02, 2014, 4:03 PM

## 2014-08-13 NOTE — Evaluation (Signed)
Physical Therapy Evaluation Patient Details Name: Jeremy Landry MRN: [id number] DOB: June 23, 1945 Today's Date: 08/13/2014   History of Present Illness  69 YO male with bilateral LE weakness for the past week that is stated to have abruptly started. 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   Clinical Impression  Pt admitted with above diagnosis. Pt currently with functional limitations due to the deficits listed below (see PT Problem List). SpO2 85% on 2L supplemental O2 upon PT entering room, SOB and HR of 120 with minimal exertion, sats improved with pursed lip breathing and 2.5L supplemental O2. Required Mod assist for transfer due to loss of balance with notable truncal and LE ataxia. Pt will benefit from skilled PT to increase their independence and safety with mobility to allow discharge to the venue listed below.       Follow Up Recommendations CIR    Equipment Recommendations   (TBD)    Recommendations for Other Services Rehab consult;OT consult     Precautions / Restrictions Precautions Precautions: Fall Restrictions Weight Bearing Restrictions: No      Mobility  Bed Mobility Overal bed mobility: Needs Assistance Bed Mobility: Rolling;Sidelying to Sit Rolling: Min guard Sidelying to sit: Min guard       General bed mobility comments: Cues for technique to log roll onto Rt side, use rail, and bring LEs off of bed. Requires considerable effort to rise to seated position but did not require physical assist.  Transfers Overall transfer level: Needs assistance Equipment used: Rolling walker (2 wheeled) Transfers: Sit to/from [facility] Sit to Stand: Mod assist;From elevated surface Stand pivot transfers: Mod assist       General transfer comment: Mod assist for boost and balance to stand with loss of balance to posterior requiring assist to sit back onto bed safely. Second attempt  pt with notable truncal and LE ataxia with attempting to maintain balance in standing and pivot to chair requiring assistance to maintain balance and RW support. LEs did not buckle however.  Ambulation/Gait                Stairs            Wheelchair Mobility    Modified Rankin (Stroke Patients Only) Modified Rankin (Stroke Patients Only) Pre-Morbid Rankin Score: Slight disability Modified Rankin: Moderately severe disability     Balance Overall balance assessment: Needs assistance Sitting-balance support: No upper extremity supported;Feet supported Sitting balance-Leahy Scale: Fair     Standing balance support: Bilateral upper extremity supported Standing balance-Leahy Scale: Poor                               Pertinent Vitals/Pain Pain Assessment: 0-10 Pain Score: 6  Pain Location: back - Legs feel heavy Pain Descriptors / Indicators: Discomfort Pain Intervention(s): Monitored during session;Repositioned    Home Living Family/patient expects to be discharged to:: Unsure Living Arrangements: Other relatives (brother; physically okay, however has caner) Available Help at Discharge: Family;Available 24 hours/day Type of Home: House Home Access: Stairs to enter Entrance Stairs-Rails: None Entrance Stairs-Number of Steps: 4 Home Layout: One level Home Equipment: Cane - single point;Walker - 2 wheels      Prior Function Level of Independence: Independent with assistive device(s)         Comments: used cane to ambulate. Could bath/dress self     Hand Dominance   Dominant Hand:  Right    Extremity/Trunk Assessment   Upper Extremity Assessment: Defer to OT evaluation           Lower Extremity Assessment: Generalized weakness (Grossly 4-/5 throughout - abnormal heel-shin test BIL)         Communication   Communication: No difficulties  Cognition Arousal/Alertness: Awake/alert Behavior During Therapy: WFL for tasks  assessed/performed Overall Cognitive Status: Within Functional Limits for tasks assessed                      General Comments General comments (skin integrity, edema, etc.): SpO2 85% on 2L supplemental O2 when PT entered room, improved to 92% with pursed lip breathing. Drops to 86% with minimal exertion. Provided inspirometer and instructions for use. HR between 107-120 with minimal exertion. States he devolps SOB easily at home.    Exercises General Exercises - Lower Extremity Ankle Circles/Pumps: AROM;Both;10 reps;Seated Long Arc Quad: Strengthening;Both;10 reps;Seated      Assessment/Plan    PT Assessment Patient needs continued PT services  PT Diagnosis Difficulty walking;Generalized weakness;Acute pain   PT Problem List Decreased strength;Decreased activity tolerance;Decreased balance;Decreased mobility;Decreased coordination;Decreased knowledge of use of DME;Cardiopulmonary status limiting activity;Pain  PT Treatment Interventions DME instruction;Gait training;Stair training;Functional mobility training;Therapeutic activities;Therapeutic exercise;Balance training;Neuromuscular re-education;Patient/family education;Modalities   PT Goals (Current goals can be found in the Care Plan section) Acute Rehab PT Goals Patient Stated Goal: None stated PT Goal Formulation: With patient Time For Goal Achievement: 08/27/14 Potential to Achieve Goals: Good    Frequency Min 4X/week   Barriers to discharge        Co-evaluation               End of Session Equipment Utilized During Treatment: Gait belt;Oxygen Activity Tolerance: Patient limited by fatigue Patient left: in chair;with call bell/phone within reach;with chair alarm set Nurse Communication: Mobility status    Functional Assessment Tool Used: clinical observation Functional Limitation: Mobility: Walking and moving around Mobility: Walking and Moving Around Current Status (Q2229): At least 40 percent but less  than 60 percent impaired, limited or restricted Mobility: Walking and Moving Around Goal Status [phone]): At least 20 percent but less than 40 percent impaired, limited or restricted    Time: 1194-1740 PT Time Calculation (min) (ACUTE ONLY): 31 min   Charges:   PT Evaluation $Initial PT Evaluation Tier I: 1 Procedure PT Treatments $Therapeutic Activity: 8-22 mins   PT G Codes:   PT G-Codes **NOT FOR INPATIENT CLASS** Functional Assessment Tool Used: clinical observation Functional Limitation: Mobility: Walking and moving around Mobility: Walking and Moving Around Current Status (C1448): At least 40 percent but less than 60 percent impaired, limited or restricted Mobility: Walking and Moving Around Goal Status [phone]): At least 20 percent but less than 40 percent impaired, limited or restricted    Ellouise Newer 08/13/2014, 2:28 PM Camille Bal [place], [facility]

## 2014-08-13 NOTE — Progress Notes (Signed)
SLP Cancellation Note  Patient Details Name: Jeremy Landry MRN: [id number] DOB: 06-12-1945   Cancelled treatment:       Reason Eval/Treat Not Completed: SLP screened, no needs identified, will sign off. Pt oriented x4, responding appropriately to questions, 100% intelligible. Reported no acute changes in speech/ language or cognition. No mention of deficits in these areas in MD/ PT reports. Will sign off- please reconsult if needs arise.   Kern Reap, MA, CCC-SLP 08/13/2014, 2:52 PM [phone]

## 2014-08-14 LAB — GLUCOSE, CAPILLARY: Glucose-Capillary: 148 mg/dL — ABNORMAL HIGH (ref 65–99)

## 2014-08-14 LAB — LIPID PANEL
CHOLESTEROL: 165 mg/dL (ref 0–200)
HDL: 41 mg/dL (ref >40)
LDL Cholesterol: 109 mg/dL — ABNORMAL HIGH (ref 0–99)
Total CHOL/HDL Ratio: 4 RATIO
Triglycerides: 75 mg/dL (ref <150)
VLDL: 15 mg/dL (ref 0–40)

## 2014-08-14 LAB — BASIC METABOLIC PANEL
ANION GAP: 9 (ref 5–15)
BUN: 18 mg/dL (ref 6–20)
CALCIUM: 9.6 mg/dL (ref 8.9–10.3)
CHLORIDE: 102 mmol/L (ref 101–111)
CO2: 25 mmol/L (ref 22–32)
Creatinine, Ser: 0.85 mg/dL (ref 0.61–1.24)
GFR calc Af Amer: >60 mL/min (ref >60)
GFR calc non Af Amer: >60 mL/min (ref >60)
GLUCOSE: 152 mg/dL — AB (ref 65–99)
POTASSIUM: 4.5 mmol/L (ref 3.5–5.1)
Sodium: 136 mmol/L (ref 135–145)

## 2014-08-14 LAB — SJOGRENS SYNDROME-B EXTRACTABLE NUCLEAR ANTIBODY: SSB (La) (ENA) Antibody, IgG: <0.2 AI (ref 0.0–0.9)

## 2014-08-14 LAB — SJOGRENS SYNDROME-A EXTRACTABLE NUCLEAR ANTIBODY: SSA (Ro) (ENA) Antibody, IgG: <0.2 AI (ref 0.0–0.9)

## 2014-08-14 NOTE — Progress Notes (Signed)
[facility] PROGRESS NOTE  Jeremy Landry YKD:[id number] DOB: 23-Sep-1945 DOA: 08/11/2014 PCP: Default, Provider, MD  Summary  69 y.o. male with Past medical history of hypertension, GERD, prostate cancer, multiple back surgeries. The patient is presenting with numbness of bilateral lower leg numbness that has been ongoing since last one week.  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  - Neurology consulted and following. They're waiting for ANA and and NMO antibody - plan is to continue IV Solu-Medrol for 5 days currently day 3 of 5  - Plavix also started recently as patient has aspirin allergy  - PT on board and currently recommended CIR. We'll place consult   Active Problems: HTN  - Amlodipine currently on hold, and blood pressures relatively well controlled  -  stable place on low salt diet low sodium  GERD -Stable, continue PPI    Smoker -Plan will be to recommend tobacco cessation  Hyperkalemia -  Replace and reassess next am.  Code Status:  full Family Communication:  None at bedside Disposition Plan:  Pending improvement in condition and evaluation by CIR physician  Consultants:  Neuro  CIR  Procedures:   None  Antibiotics:  None  HPI/Subjective: No new complaints  Objective: Filed Vitals:   08/14/14 1010  BP: 126/65  Pulse: 77  Temp: 98 F (36.7 C)  Resp: 20    Intake/Output Summary (Last 24 hours) at 08/14/14 1401 Last data filed at 08/14/14 0212  Gross per 24 hour  Intake      0 ml  Output    200 ml  Net   -200 ml   Filed Weights   08/12/14 0220  Weight: 113.8 kg (250 lb 14.1 oz)    Exam:   General:  Awake and alert, in nad  Cardiovascular: RRR without MGR  Respiratory: CTA, no wheezes, equal chest rise  Abdomen: NT, ND, soft  Ext: no edema  Neuro:no facial asymmetry, answers questions  appropriately  Basic Metabolic Panel:  Recent Labs Lab 08/11/14 1400 08/12/14 0029 08/12/14 1515 08/14/14 0635  NA 139 141 138 136  K 3.4* 3.6 3.3* 4.5  CL 106 106 102 102  CO2 '26 27 29 25  '$ GLUCOSE 106* 103* 131* 152*  BUN '13 12 10 18  '$ CREATININE 1.04 0.87 0.92 0.85  CALCIUM 9.4 9.6 9.3 9.6   Liver Function Tests:  Recent Labs Lab 08/12/14 0029 08/12/14 1515  AST 17 18  ALT 15* 14*  ALKPHOS 53 52  BILITOT 0.8 0.6  PROT 7.3 7.0  ALBUMIN 3.8 3.6   No results for input(s): LIPASE, AMYLASE in the last 168 hours. No results for input(s): AMMONIA in the last 168 hours. CBC:  Recent Labs Lab 08/11/14 1400 08/12/14 0029  WBC 5.3 6.0  NEUTROABS 3.2  --   HGB 11.9* 12.2*  HCT 36.1* 35.9*  MCV 93.3 92.1  PLT 240 237   Cardiac Enzymes: No results for input(s): CKTOTAL, CKMB, CKMBINDEX, TROPONINI in the last 168 hours. BNP (last 3 results) No results for input(s): BNP in the last 8760 hours.  ProBNP (last 3 results) No results for input(s): PROBNP in the last 8760 hours.  CBG:  Recent Labs Lab 08/13/14 0608 08/14/14 0528  GLUCAP 144* 148*    No results found for this or any previous visit (from the past 240 hour(s)).   Studies: Dg Chest 2 View  08/11/2014  CLINICAL DATA:  Chronic shortness of breath. Chronic left upper extremity region pain. Hypertension.  EXAM: CHEST  2 VIEW  COMPARISON:  May 25, 2010  FINDINGS: There is slight scarring in the right and left lower lobe regions. There is no edema or consolidation. Heart size and pulmonary vascularity are normal. No adenopathy. There is postoperative change in the lower cervical region.  IMPRESSION: Areas of mild scarring in the lower lobes bilaterally. No edema or consolidation.   Electronically Signed   By: Lowella Grip III M.D.   On: 08/11/2014 14:42   Ct Head Wo Contrast  08/12/2014   CLINICAL DATA:  Numb waistline for 5 days. History of hypertension and prostate cancer. Current smoker.  EXAM: CT HEAD  WITHOUT CONTRAST  TECHNIQUE: Contiguous axial images were obtained from the base of the skull through the vertex without intravenous contrast.  COMPARISON:  MRI of the head May 24, 2010  FINDINGS: The ventricles and sulci are normal for age. No intraparenchymal hemorrhage, mass effect nor midline shift. Patchy supratentorial white matter hypodensities are within normal range for patient's age and though non-specific suggest sequelae of chronic small vessel ischemic disease. No acute large vascular territory infarcts.  No abnormal extra-axial fluid collections. Basal cisterns are patent. Mild calcific atherosclerosis of the carotid siphons.  No skull fracture. The included ocular globes and orbital contents are non-suspicious. Moderate paranasal sinus mucosal thickening without air-fluid levels. The mastoid air cells are well aerated.  IMPRESSION: No acute intracranial process.  Involutional changes. Moderate white matter changes compatible with chronic small vessel ischemic disease.   Electronically Signed   By: Elon Alas M.D.   On: 08/12/2014 01:24   Mr Cervical Spine Wo Contrast  08/12/2014   CLINICAL DATA:  Numbness and bilateral lower extremity weakness for a little more than 1 week. History of prostate cancer.  EXAM: MRI CERVICAL AND THORACIC SPINE WITHOUT CONTRAST  TECHNIQUE: Multiplanar and multiecho pulse sequences of the cervical and thoracic spine were obtained without intravenous contrast.  COMPARISON:  None.  FINDINGS: MR CERVICAL SPINE FINDINGS  The study is degraded by patient motion. The patient is status post C4-7 anterior discectomy and fusion. There is straightening of the normal cervical lordosis but vertebral body height and alignment are maintained. The craniocervical junction is normal. Cervical cord signal is unremarkable. The patient has a congenitally narrow central spinal canal. Imaged paraspinous structures are unremarkable.  C2-3: Shallow disc bulge without central canal or  foraminal narrowing.  C3-4: Disc osteophyte complex narrows the ventral thecal sac but the central canal and foramina appear open.  C4-5: Status post discectomy and fusion. The central canal is open. Uncovertebral spurring causes mild to moderate foraminal narrowing, more notable on the right.  C5-6: Status post discectomy and fusion. The central canal is patent. Uncovertebral spurring causes moderate to moderately severe foraminal narrowing, worse on the right.  C6-7: Status post discectomy and fusion. Endplate spurring and uncovertebral disease are seen. The ventral cord is mildly deformed. Moderately severe to severe bilateral foraminal narrowing is identified.  C7-T1: Shallow disc bulge endplate spur. The central canal is mildly narrowed. Moderate to moderately severe bilateral foraminal narrowing appears worse on the right.  MR THORACIC SPINE FINDINGS  Vertebral body height, signal and alignment are maintained. There is signal abnormality centrally within the cord from approximately the T3-4 level through the superior aspect of T5. The cord appears anteriorly positioned against the T4 vertebral body which is likely due to normal kyphosis. The thoracic cord is  otherwise unremarkable. The central canal and neural foramina appear patent at all levels. Small central protrusion at T8-9 and shallow right paracentral protrusion T9-10 are noted.  IMPRESSION: Signal abnormality within the thoracic cord from the T3-4 through upper T5 level is nonspecific. Main differential considerations are cord infarct versus transverse myelitis. The cord appears anteriorly positioned this level which is likely due to kyphosis rather than a ventral dural defect. Post-contrast and diffusion imaging of the thoracic cord are recommended.  Status post C4-7 fusion. Uncovertebral and endplate spurring cause foraminal narrowing at the fusion levels. The cord is mildly deformed at the C6-7 level as described above.  Critical Value/emergent  results were called by telephone at the time of interpretation on 08/12/2014 at 9:13 am to Dr. Alexis Goodell , who verbally acknowledged these results.   Electronically Signed   By: Inge Rise M.D.   On: 08/12/2014 09:16   Mr Thoracic Spine Wo Contrast  08/12/2014   CLINICAL DATA:  Numbness and bilateral lower extremity weakness for a little more than 1 week. History of prostate cancer.  EXAM: MRI CERVICAL AND THORACIC SPINE WITHOUT CONTRAST  TECHNIQUE: Multiplanar and multiecho pulse sequences of the cervical and thoracic spine were obtained without intravenous contrast.  COMPARISON:  None.  FINDINGS: MR CERVICAL SPINE FINDINGS  The study is degraded by patient motion. The patient is status post C4-7 anterior discectomy and fusion. There is straightening of the normal cervical lordosis but vertebral body height and alignment are maintained. The craniocervical junction is normal. Cervical cord signal is unremarkable. The patient has a congenitally narrow central spinal canal. Imaged paraspinous structures are unremarkable.  C2-3: Shallow disc bulge without central canal or foraminal narrowing.  C3-4: Disc osteophyte complex narrows the ventral thecal sac but the central canal and foramina appear open.  C4-5: Status post discectomy and fusion. The central canal is open. Uncovertebral spurring causes mild to moderate foraminal narrowing, more notable on the right.  C5-6: Status post discectomy and fusion. The central canal is patent. Uncovertebral spurring causes moderate to moderately severe foraminal narrowing, worse on the right.  C6-7: Status post discectomy and fusion. Endplate spurring and uncovertebral disease are seen. The ventral cord is mildly deformed. Moderately severe to severe bilateral foraminal narrowing is identified.  C7-T1: Shallow disc bulge endplate spur. The central canal is mildly narrowed. Moderate to moderately severe bilateral foraminal narrowing appears worse on the right.  MR  THORACIC SPINE FINDINGS  Vertebral body height, signal and alignment are maintained. There is signal abnormality centrally within the cord from approximately the T3-4 level through the superior aspect of T5. The cord appears anteriorly positioned against the T4 vertebral body which is likely due to normal kyphosis. The thoracic cord is otherwise unremarkable. The central canal and neural foramina appear patent at all levels. Small central protrusion at T8-9 and shallow right paracentral protrusion T9-10 are noted.  IMPRESSION: Signal abnormality within the thoracic cord from the T3-4 through upper T5 level is nonspecific. Main differential considerations are cord infarct versus transverse myelitis. The cord appears anteriorly positioned this level which is likely due to kyphosis rather than a ventral dural defect. Post-contrast and diffusion imaging of the thoracic cord are recommended.  Status post C4-7 fusion. Uncovertebral and endplate spurring cause foraminal narrowing at the fusion levels. The cord is mildly deformed at the C6-7 level as described above.  Critical Value/emergent results were called by telephone at the time of interpretation on 08/12/2014 at 9:13 am to Dr. Alexis Goodell ,  who verbally acknowledged these results.   Electronically Signed   By: Inge Rise M.D.   On: 08/12/2014 09:16   Mr Thoracic Spine W Contrast  08/12/2014   CLINICAL DATA:  Patient with history of prostate cancer. Acute onset of pain radiating from rectum to penis. On waking the next day, patient had numbness from waist down.  EXAM: MRI THORACIC SPINE WITH CONTRAST  TECHNIQUE: Multiplanar and multiecho pulse sequences of the thoracic spine were obtained with intravenous contrast.  CONTRAST:  23m MULTIHANCE GADOBENATE DIMEGLUMINE 529 MG/ML IV SOLN  COMPARISON:  08/12/2014 cervical and thoracic MRI.  FINDINGS: This study compliments the previous cervical and thoracic MRI. Counting was performed from the craniocervical  junction. C4 through C7 ACDF. Bone marrow signal shows heterogenous marrow. This is a nonspecific finding most commonly associated with obesity, anemia, cigarette smoking or chronic disease. No enhancing aggressive osseous lesions are present. There is enhancement of the T1 superior endplate which is compatible with adjacent segment disease from the relatively long segment cervical fusion above. No findings to suggest bony metastatic disease.  Thoracic cord is swollen with increased intramedullary signal extending from T3-T4 through the mid T5 vertebra. This area demonstrates post gadolinium enhancement. No intramedullary hematoma. There is increased diffusion signal on diffusion-weighted imaging which could be associated with either infarct or transverse myelitis. No evidence of AVM. On the axial images, the cord enhancement is central in the lesion. No thoracic spinal stenosis.  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 Central enhancement after gadolinium administration with probable restricted diffusion. The differential considerations are unchanged and this pre and post gadolinium study does not establish a conclusive diagnosis. Most likely differential considerations are cord infarct versus transverse myelitis. Given these differential considerations, the clinical presentation would probably be more helpful in narrowing the differential diagnosis.   Electronically Signed   By: GDereck LigasM.D.   On: 08/12/2014 12:48   Mr Lumbar Spine W Wo Contrast  08/11/2014   CLINICAL DATA:  Numbness and tingling both legs. Prior back surgery.  EXAM: MRI LUMBAR SPINE WITHOUT AND WITH CONTRAST  TECHNIQUE: Multiplanar and multiecho pulse sequences of the lumbar spine were obtained without and with intravenous contrast.  CONTRAST:  275mMULTIHANCE GADOBENATE DIMEGLUMINE 529 MG/ML IV SOLN  COMPARISON:  Lumbar radiographs 12/14/2007, CT myelogram 04/11/2004, lumbar MRI  03/24/2004  FINDINGS: Based on prior radiographs, 6 non-rib-bearing lumbar segments are present. The lowest of these will be labeled S1, consistent with prior MRI and myelogram. Lowest disc space is S1-S2.  Straightening of the lumbar lordosis. Negative for fracture or mass. Multilevel degenerative change in the lumbar spine with extensive bone marrow signal abnormalities compatible with disc degeneration. Conus medullaris is normal and terminates at L1-2.  L1-2:  Mild disc and mild facet degeneration without spinal stenosis  L2-3: Diffuse disc bulging and spondylosis. Prominent right lateral osteophyte and disc complex similar to 2006. Bilateral facet degeneration. Small right-sided disc protrusion unchanged from the prior MRI. Mild spinal stenosis.  L3-4: Disc degeneration and spondylosis with diffuse endplate osteophyte formation. Bilateral facet hypertrophy. Improvement in right-sided disc protrusion since the prior studies. Moderate spinal stenosis. Moderate facet hypertrophy bilaterally. Foraminal encroachment is present bilaterally  L4-5: Diffuse disc bulging and spondylosis. Moderate spinal stenosis. Subarticular stenosis bilaterally. No change from the prior studies  L5-S1: Prior laminotomy on the right. Disc degeneration and spurring right greater than left with subarticular and foraminal stenosis on the right. Moderate spinal stenosis unchanged from the prior  study.  L5-S1: Disc and facet degeneration. No significant disc protrusion or stenosis.  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  The lowest disc space is labeled L5-S1.  L2-3 disc and facet degeneration and mild spinal stenosis unchanged. Small right-sided disc protrusion L2-3 unchanged.  L3-4 disc and facet degeneration with moderate spinal stenosis. Interval improvement in right-sided disc protrusion since 2006.  Moderate spinal stenosis L4-5 due to spondylosis and facet hypertrophy.  Moderate spinal stenosis L5-S1 unchanged from the prior study.    Electronically Signed   By: Franchot Gallo M.D.   On: 08/11/2014 18:54    Scheduled Meds: . finasteride  5 mg Oral Daily  . ipratropium-albuterol  3 mL Nebulization TID  . methylPREDNISolone (SOLU-MEDROL) injection  1,000 mg Intravenous Daily  . pantoprazole  40 mg Oral Daily  . pneumococcal 23 valent vaccine  0.5 mL Intramuscular Tomorrow-1000  . sertraline  100 mg Oral Daily  . terazosin  5 mg Oral QHS   Continuous Infusions:   Time spent: 25 minutes  Tysha Grismore, [facility]  [facility] [email or web address], password [facility] 08/14/2014, 2:01 PM  LOS: 1 day

## 2014-08-14 NOTE — Progress Notes (Addendum)
Subjective: No overnight events. Notes improvement in his bilateral LE weakness.   Lab workup so far unremarkable. Pending ANA and NMO antibody  Objective: Current vital signs: BP 143/71 mmHg  Pulse 73  Temp(Src) 98.3 F (36.8 C) (Oral)  Resp 20  Ht '6\' 2"'$  (1.88 m)  Wt 113.8 kg (250 lb 14.1 oz)  BMI 32.20 kg/m2  SpO2 96% Vital signs in last 24 hours: Temp:  [98 F (36.7 C)-98.9 F (37.2 C)] 98.3 F (36.8 C) (08/07 0530) Pulse Rate:  [73-109] 73 (08/07 0530) Resp:  [18-20] 20 (08/07 0530) BP: (123-143)/(56-77) 143/71 mmHg (08/07 0530) SpO2:  [91 %-100 %] 96 % (08/07 0530)  Intake/Output from previous day: 08/06 0701 - 08/07 0700 In: -  Out: 200 [Urine:200] Intake/Output this shift:   Nutritional status: Diet 2 gram sodium Room service appropriate?: Yes; Fluid consistency:: Thin  Neurologic Exam: Mental Status: Alert, oriented, thought content appropriate. Speech fluent without evidence of aphasia. Able to follow 3 step commands without difficulty. Cranial Nerves: II: Able to appreciate visual stimuli in all visual fields, pupils equal, round, reactive to light and accommodation III,IV, VI: ptosis not present, extra-ocular motions intact bilaterally V,VII: decrease in left NLF, facial light touch sensation normal bilaterally Motor: Right :Upper extremity 4+/5Left: Upper extremity 5/5 Lower extremity 4-/5Lower extremity 4-/5 Tone and bulk:normal tone throughout; no atrophy noted Sensory: Pinprick and light touch decreased to below the umbilicus,   Lab Results: Basic Metabolic Panel:  Recent Labs Lab 08/11/14 1400 08/12/14 0029 08/12/14 1515  NA 139 141 138  K 3.4* 3.6 3.3*  CL 106 106 102  CO2 '26 27 29  '$ GLUCOSE 106* 103* 131*  BUN '13 12 10  '$ CREATININE 1.04 0.87 0.92  CALCIUM 9.4 9.6 9.3    Liver Function Tests:  Recent Labs Lab  08/12/14 0029 08/12/14 1515  AST 17 18  ALT 15* 14*  ALKPHOS 53 52  BILITOT 0.8 0.6  PROT 7.3 7.0  ALBUMIN 3.8 3.6   No results for input(s): LIPASE, AMYLASE in the last 168 hours. No results for input(s): AMMONIA in the last 168 hours.  CBC:  Recent Labs Lab 08/11/14 1400 08/12/14 0029  WBC 5.3 6.0  NEUTROABS 3.2  --   HGB 11.9* 12.2*  HCT 36.1* 35.9*  MCV 93.3 92.1  PLT 240 237    Cardiac Enzymes: No results for input(s): CKTOTAL, CKMB, CKMBINDEX, TROPONINI in the last 168 hours.  Lipid Panel:  Recent Labs Lab 08/12/14 0532  CHOL 156  TRIG 167*  HDL 31*  CHOLHDL 5.0  VLDL 33  LDLCALC 92    CBG:  Recent Labs Lab 08/13/14 0608 08/14/14 0528  GLUCAP 144* 148*    Microbiology: Results for orders placed or performed during the hospital encounter of 05/23/10  Urine culture     Status: None   Collection Time: 05/25/10  8:33 AM  Result Value Ref Range Status   Specimen Description URINE, CLEAN CATCH  Final   Special Requests NONE  Final   Culture  Setup Time [id number]  Final   Colony Count NO GROWTH  Final   Culture NO GROWTH  Final   Report Status 05/26/2010 FINAL  Final    Coagulation Studies:  Recent Labs  08/12/14 0029  LABPROT 14.3  INR 1.09    Imaging: Dg Chest 2 View  08/11/2014   CLINICAL DATA:  Chronic shortness of breath. Chronic left upper extremity region pain. Hypertension.  EXAM: CHEST  2 VIEW  COMPARISON:  May 25, 2010  FINDINGS: There is slight scarring in the right and left lower lobe regions. There is no edema or consolidation. Heart size and pulmonary vascularity are normal. No adenopathy. There is postoperative change in the lower cervical region.  IMPRESSION: Areas of mild scarring in the lower lobes bilaterally. No edema or consolidation.   Electronically Signed   By: Lowella Grip III M.D.   On: 08/11/2014 14:42   Ct Head Wo Contrast  08/12/2014   CLINICAL DATA:  Numb waistline for 5 days. History of hypertension and  prostate cancer. Current smoker.  EXAM: CT HEAD WITHOUT CONTRAST  TECHNIQUE: Contiguous axial images were obtained from the base of the skull through the vertex without intravenous contrast.  COMPARISON:  MRI of the head May 24, 2010  FINDINGS: The ventricles and sulci are normal for age. No intraparenchymal hemorrhage, mass effect nor midline shift. Patchy supratentorial white matter hypodensities are within normal range for patient's age and though non-specific suggest sequelae of chronic small vessel ischemic disease. No acute large vascular territory infarcts.  No abnormal extra-axial fluid collections. Basal cisterns are patent. Mild calcific atherosclerosis of the carotid siphons.  No skull fracture. The included ocular globes and orbital contents are non-suspicious. Moderate paranasal sinus mucosal thickening without air-fluid levels. The mastoid air cells are well aerated.  IMPRESSION: No acute intracranial process.  Involutional changes. Moderate white matter changes compatible with chronic small vessel ischemic disease.   Electronically Signed   By: Elon Alas M.D.   On: 08/12/2014 01:24   Mr Cervical Spine Wo Contrast  08/12/2014   CLINICAL DATA:  Numbness and bilateral lower extremity weakness for a little more than 1 week. History of prostate cancer.  EXAM: MRI CERVICAL AND THORACIC SPINE WITHOUT CONTRAST  TECHNIQUE: Multiplanar and multiecho pulse sequences of the cervical and thoracic spine were obtained without intravenous contrast.  COMPARISON:  None.  FINDINGS: MR CERVICAL SPINE FINDINGS  The study is degraded by patient motion. The patient is status post C4-7 anterior discectomy and fusion. There is straightening of the normal cervical lordosis but vertebral body height and alignment are maintained. The craniocervical junction is normal. Cervical cord signal is unremarkable. The patient has a congenitally narrow central spinal canal. Imaged paraspinous structures are unremarkable.  C2-3:  Shallow disc bulge without central canal or foraminal narrowing.  C3-4: Disc osteophyte complex narrows the ventral thecal sac but the central canal and foramina appear open.  C4-5: Status post discectomy and fusion. The central canal is open. Uncovertebral spurring causes mild to moderate foraminal narrowing, more notable on the right.  C5-6: Status post discectomy and fusion. The central canal is patent. Uncovertebral spurring causes moderate to moderately severe foraminal narrowing, worse on the right.  C6-7: Status post discectomy and fusion. Endplate spurring and uncovertebral disease are seen. The ventral cord is mildly deformed. Moderately severe to severe bilateral foraminal narrowing is identified.  C7-T1: Shallow disc bulge endplate spur. The central canal is mildly narrowed. Moderate to moderately severe bilateral foraminal narrowing appears worse on the right.  MR THORACIC SPINE FINDINGS  Vertebral body height, signal and alignment are maintained. There is signal abnormality centrally within the cord from approximately the T3-4 level through the superior aspect of T5. The cord appears anteriorly positioned against the T4 vertebral body which is likely due to normal kyphosis. The thoracic cord is otherwise unremarkable. The central canal and neural foramina appear patent at all levels. Small central protrusion at T8-9 and shallow right paracentral protrusion T9-10 are  noted.  IMPRESSION: Signal abnormality within the thoracic cord from the T3-4 through upper T5 level is nonspecific. Main differential considerations are cord infarct versus transverse myelitis. The cord appears anteriorly positioned this level which is likely due to kyphosis rather than a ventral dural defect. Post-contrast and diffusion imaging of the thoracic cord are recommended.  Status post C4-7 fusion. Uncovertebral and endplate spurring cause foraminal narrowing at the fusion levels. The cord is mildly deformed at the C6-7 level as  described above.  Critical Value/emergent results were called by telephone at the time of interpretation on 08/12/2014 at 9:13 am to Dr. Alexis Goodell , who verbally acknowledged these results.   Electronically Signed   By: Inge Rise M.D.   On: 08/12/2014 09:16   Mr Thoracic Spine Wo Contrast  08/12/2014   CLINICAL DATA:  Numbness and bilateral lower extremity weakness for a little more than 1 week. History of prostate cancer.  EXAM: MRI CERVICAL AND THORACIC SPINE WITHOUT CONTRAST  TECHNIQUE: Multiplanar and multiecho pulse sequences of the cervical and thoracic spine were obtained without intravenous contrast.  COMPARISON:  None.  FINDINGS: MR CERVICAL SPINE FINDINGS  The study is degraded by patient motion. The patient is status post C4-7 anterior discectomy and fusion. There is straightening of the normal cervical lordosis but vertebral body height and alignment are maintained. The craniocervical junction is normal. Cervical cord signal is unremarkable. The patient has a congenitally narrow central spinal canal. Imaged paraspinous structures are unremarkable.  C2-3: Shallow disc bulge without central canal or foraminal narrowing.  C3-4: Disc osteophyte complex narrows the ventral thecal sac but the central canal and foramina appear open.  C4-5: Status post discectomy and fusion. The central canal is open. Uncovertebral spurring causes mild to moderate foraminal narrowing, more notable on the right.  C5-6: Status post discectomy and fusion. The central canal is patent. Uncovertebral spurring causes moderate to moderately severe foraminal narrowing, worse on the right.  C6-7: Status post discectomy and fusion. Endplate spurring and uncovertebral disease are seen. The ventral cord is mildly deformed. Moderately severe to severe bilateral foraminal narrowing is identified.  C7-T1: Shallow disc bulge endplate spur. The central canal is mildly narrowed. Moderate to moderately severe bilateral foraminal  narrowing appears worse on the right.  MR THORACIC SPINE FINDINGS  Vertebral body height, signal and alignment are maintained. There is signal abnormality centrally within the cord from approximately the T3-4 level through the superior aspect of T5. The cord appears anteriorly positioned against the T4 vertebral body which is likely due to normal kyphosis. The thoracic cord is otherwise unremarkable. The central canal and neural foramina appear patent at all levels. Small central protrusion at T8-9 and shallow right paracentral protrusion T9-10 are noted.  IMPRESSION: Signal abnormality within the thoracic cord from the T3-4 through upper T5 level is nonspecific. Main differential considerations are cord infarct versus transverse myelitis. The cord appears anteriorly positioned this level which is likely due to kyphosis rather than a ventral dural defect. Post-contrast and diffusion imaging of the thoracic cord are recommended.  Status post C4-7 fusion. Uncovertebral and endplate spurring cause foraminal narrowing at the fusion levels. The cord is mildly deformed at the C6-7 level as described above.  Critical Value/emergent results were called by telephone at the time of interpretation on 08/12/2014 at 9:13 am to Dr. Alexis Goodell , who verbally acknowledged these results.   Electronically Signed   By: Inge Rise M.D.   On: 08/12/2014 09:16   Mr Thoracic  Spine W Contrast  08/12/2014   CLINICAL DATA:  Patient with history of prostate cancer. Acute onset of pain radiating from rectum to penis. On waking the next day, patient had numbness from waist down.  EXAM: MRI THORACIC SPINE WITH CONTRAST  TECHNIQUE: Multiplanar and multiecho pulse sequences of the thoracic spine were obtained with intravenous contrast.  CONTRAST:  42m MULTIHANCE GADOBENATE DIMEGLUMINE 529 MG/ML IV SOLN  COMPARISON:  08/12/2014 cervical and thoracic MRI.  FINDINGS: This study compliments the previous cervical and thoracic MRI. Counting  was performed from the craniocervical junction. C4 through C7 ACDF. Bone marrow signal shows heterogenous marrow. This is a nonspecific finding most commonly associated with obesity, anemia, cigarette smoking or chronic disease. No enhancing aggressive osseous lesions are present. There is enhancement of the T1 superior endplate which is compatible with adjacent segment disease from the relatively long segment cervical fusion above. No findings to suggest bony metastatic disease.  Thoracic cord is swollen with increased intramedullary signal extending from T3-T4 through the mid T5 vertebra. This area demonstrates post gadolinium enhancement. No intramedullary hematoma. There is increased diffusion signal on diffusion-weighted imaging which could be associated with either infarct or transverse myelitis. No evidence of AVM. On the axial images, the cord enhancement is central in the lesion. No thoracic spinal stenosis.  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 Central enhancement after gadolinium administration with probable restricted diffusion. The differential considerations are unchanged and this pre and post gadolinium study does not establish a conclusive diagnosis. Most likely differential considerations are cord infarct versus transverse myelitis. Given these differential considerations, the clinical presentation would probably be more helpful in narrowing the differential diagnosis.   Electronically Signed   By: GDereck LigasM.D.   On: 08/12/2014 12:48   Mr Lumbar Spine W Wo Contrast  08/11/2014   CLINICAL DATA:  Numbness and tingling both legs. Prior back surgery.  EXAM: MRI LUMBAR SPINE WITHOUT AND WITH CONTRAST  TECHNIQUE: Multiplanar and multiecho pulse sequences of the lumbar spine were obtained without and with intravenous contrast.  CONTRAST:  255mMULTIHANCE GADOBENATE DIMEGLUMINE 529 MG/ML IV SOLN  COMPARISON:  Lumbar radiographs 12/14/2007, CT  myelogram 04/11/2004, lumbar MRI 03/24/2004  FINDINGS: Based on prior radiographs, 6 non-rib-bearing lumbar segments are present. The lowest of these will be labeled S1, consistent with prior MRI and myelogram. Lowest disc space is S1-S2.  Straightening of the lumbar lordosis. Negative for fracture or mass. Multilevel degenerative change in the lumbar spine with extensive bone marrow signal abnormalities compatible with disc degeneration. Conus medullaris is normal and terminates at L1-2.  L1-2:  Mild disc and mild facet degeneration without spinal stenosis  L2-3: Diffuse disc bulging and spondylosis. Prominent right lateral osteophyte and disc complex similar to 2006. Bilateral facet degeneration. Small right-sided disc protrusion unchanged from the prior MRI. Mild spinal stenosis.  L3-4: Disc degeneration and spondylosis with diffuse endplate osteophyte formation. Bilateral facet hypertrophy. Improvement in right-sided disc protrusion since the prior studies. Moderate spinal stenosis. Moderate facet hypertrophy bilaterally. Foraminal encroachment is present bilaterally  L4-5: Diffuse disc bulging and spondylosis. Moderate spinal stenosis. Subarticular stenosis bilaterally. No change from the prior studies  L5-S1: Prior laminotomy on the right. Disc degeneration and spurring right greater than left with subarticular and foraminal stenosis on the right. Moderate spinal stenosis unchanged from the prior study.  L5-S1: Disc and facet degeneration. No significant disc protrusion or stenosis.  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  The lowest disc space is labeled  L5-S1.  L2-3 disc and facet degeneration and mild spinal stenosis unchanged. Small right-sided disc protrusion L2-3 unchanged.  L3-4 disc and facet degeneration with moderate spinal stenosis. Interval improvement in right-sided disc protrusion since 2006.  Moderate spinal stenosis L4-5 due to spondylosis and facet hypertrophy.  Moderate spinal stenosis L5-S1 unchanged  from the prior study.   Electronically Signed   By: Franchot Gallo M.D.   On: 08/11/2014 18:54    Medications:  Scheduled: . finasteride  5 mg Oral Daily  . ipratropium-albuterol  3 mL Nebulization TID  . methylPREDNISolone (SOLU-MEDROL) injection  1,000 mg Intravenous Daily  . pantoprazole  40 mg Oral Daily  . pneumococcal 23 valent vaccine  0.5 mL Intramuscular Tomorrow-1000  . sertraline  100 mg Oral Daily  . terazosin  5 mg Oral QHS    Assessment/Plan: 70 YO male with bilateral LE weakness for the past week that is stated to have abruptly started. 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 Central enhancement after gadolinium administration with probable restricted diffusion. LP attempted at bedside was unsuccessful. IR was contacted to do a LP but due to MRI findings they did not feel there was an indication to do LP.  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  -continue IV solumedrol x 5 days (current day 3/5) -ANA and NMO antibody pending -continue Plavix '75mg'$  daily -rehab evaluation -lipid panel and Hemoglobin A1c pending -will continue to follow   08/14/2014, 8:18 AM  Jim Like, DO [facility] [phone]  If 7pm- 7am, please page neurology on call as listed in [place].

## 2014-08-15 DIAGNOSIS — R208 Other disturbances of skin sensation: Secondary | ICD-10-CM

## 2014-08-15 DIAGNOSIS — I10 Essential (primary) hypertension: Secondary | ICD-10-CM

## 2014-08-15 DIAGNOSIS — R200 Anesthesia of skin: Secondary | ICD-10-CM | POA: Insufficient documentation

## 2014-08-15 DIAGNOSIS — G839 Paralytic syndrome, unspecified: Secondary | ICD-10-CM

## 2014-08-15 LAB — ZINC: ZINC: 56 ug/dL (ref 56–134)

## 2014-08-15 LAB — GLUCOSE, CAPILLARY: GLUCOSE-CAPILLARY: 119 mg/dL — AB (ref 65–99)

## 2014-08-15 LAB — METHYLMALONIC ACID, SERUM: Methylmalonic Acid, Quantitative: 128 nmol/L (ref 0–378)

## 2014-08-15 LAB — VITAMIN E: ALPHA-TOCOPHEROL: 9.2 mg/L (ref 5.3–17.5)

## 2014-08-15 LAB — ANTINUCLEAR ANTIBODIES, IFA: ANA Ab, IFA: NEGATIVE

## 2014-08-15 LAB — HEMOGLOBIN A1C
Hgb A1c MFr Bld: 5.5 % (ref 4.8–5.6)
Mean Plasma Glucose: 111 mg/dL

## 2014-08-15 LAB — COPPER, SERUM: Copper: 113 ug/dL (ref 72–166)

## 2014-08-15 MED ORDER — SIMVASTATIN 20 MG PO TABS
20.0000 mg | ORAL_TABLET | Freq: Every day | ORAL | Status: DC
  Administered 2014-08-15 – 2014-08-17 (×3): 20 mg via ORAL
  Filled 2014-08-15 (×3): qty 1

## 2014-08-15 NOTE — Progress Notes (Signed)
[facility] PROGRESS NOTE  Jeremy Landry YCX:[id number] DOB: [birth year] DOA: 08/11/2014 PCP: Default, Provider, MD  Summary  69 y.o. male with Past medical history of hypertension, GERD, prostate cancer, multiple back surgeries. The patient is presenting with numbness of bilateral lower leg numbness that has been ongoing since last one week.  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  - Neurology consulted and following. They're waiting for ANA and and NMO antibody - plan is to continue IV Solu-Medrol for 5 days currently day 4 of 5  - Plavix also started recently as patient has aspirin allergy  - CIR consult placed - start on statin given goal of < 70 currently 109.  Last ast and alt 18 and 14  Active Problems: HTN  - Amlodipine currently on hold, and blood pressures relatively well controlled  -  stable placed on low salt diet low sodium  GERD -Stable, continue PPI    Smoker -Plan will be to recommend tobacco cessation  Hyperkalemia -  Replace and reassess next am.  Code Status:  full Family Communication:  None at bedside Disposition Plan:  Pending improvement in condition and evaluation by CIR physician  Consultants:  Neuro  CIR  Procedures:   None  Antibiotics:  None  HPI/Subjective: No new complaints  Objective: Filed Vitals:   08/15/14 1030  BP: 118/64  Pulse: 69  Temp: 98.1 F (36.7 C)  Resp: 18   No intake or output data in the 24 hours ending 08/15/14 1125 Filed Weights   08/12/14 0220  Weight: 113.8 kg (250 lb 14.1 oz)    Exam:   General:  Awake and alert, in nad  Cardiovascular: RRR without MGR  Respiratory: CTA, no wheezes, equal chest rise  Abdomen: NT, ND, soft  Ext: no edema  Neuro:no facial asymmetry, answers questions appropriately  Basic Metabolic Panel:  Recent Labs Lab 08/11/14 1400  08/12/14 0029 08/12/14 1515 08/14/14 0635  NA 139 141 138 136  K 3.4* 3.6 3.3* 4.5  CL 106 106 102 102  CO2 '26 27 29 25  '$ GLUCOSE 106* 103* 131* 152*  BUN '13 12 10 18  '$ CREATININE 1.04 0.87 0.92 0.85  CALCIUM 9.4 9.6 9.3 9.6   Liver Function Tests:  Recent Labs Lab 08/12/14 0029 08/12/14 1515  AST 17 18  ALT 15* 14*  ALKPHOS 53 52  BILITOT 0.8 0.6  PROT 7.3 7.0  ALBUMIN 3.8 3.6   No results for input(s): LIPASE, AMYLASE in the last 168 hours. No results for input(s): AMMONIA in the last 168 hours. CBC:  Recent Labs Lab 08/11/14 1400 08/12/14 0029  WBC 5.3 6.0  NEUTROABS 3.2  --   HGB 11.9* 12.2*  HCT 36.1* 35.9*  MCV 93.3 92.1  PLT 240 237   Cardiac Enzymes: No results for input(s): CKTOTAL, CKMB, CKMBINDEX, TROPONINI in the last 168 hours. BNP (last 3 results) No results for input(s): BNP in the last 8760 hours.  ProBNP (last 3 results) No results for input(s): PROBNP in the last 8760 hours.  CBG:  Recent Labs Lab 08/13/14 0608 08/14/14 0528 08/15/14 0532  GLUCAP 144* 148* 119*    No results found for this or any previous visit (from the past 240 hour(s)).   Studies: Dg Chest 2 View  08/11/2014   CLINICAL DATA:  Chronic shortness of breath. Chronic left upper extremity region pain. Hypertension.  EXAM: CHEST  2 VIEW  COMPARISON:  May 25, 2010  FINDINGS: There is slight scarring in the right and left lower lobe regions. There is no edema or consolidation. Heart size and pulmonary vascularity are normal. No adenopathy. There is postoperative change in the lower cervical region.  IMPRESSION: Areas of mild scarring in the lower lobes bilaterally. No edema or consolidation.   Electronically Signed   By: Lowella Grip III M.D.   On: 08/11/2014 14:42   Ct Head Wo Contrast  08/12/2014   CLINICAL DATA:  Numb waistline for 5 days. History of hypertension and prostate cancer. Current smoker.  EXAM: CT HEAD WITHOUT CONTRAST  TECHNIQUE: Contiguous axial images  were obtained from the base of the skull through the vertex without intravenous contrast.  COMPARISON:  MRI of the head May 24, 2010  FINDINGS: The ventricles and sulci are normal for age. No intraparenchymal hemorrhage, mass effect nor midline shift. Patchy supratentorial white matter hypodensities are within normal range for patient's age and though non-specific suggest sequelae of chronic small vessel ischemic disease. No acute large vascular territory infarcts.  No abnormal extra-axial fluid collections. Basal cisterns are patent. Mild calcific atherosclerosis of the carotid siphons.  No skull fracture. The included ocular globes and orbital contents are non-suspicious. Moderate paranasal sinus mucosal thickening without air-fluid levels. The mastoid air cells are well aerated.  IMPRESSION: No acute intracranial process.  Involutional changes. Moderate white matter changes compatible with chronic small vessel ischemic disease.   Electronically Signed   By: Elon Alas M.D.   On: 08/12/2014 01:24   Mr Cervical Spine Wo Contrast  08/12/2014   CLINICAL DATA:  Numbness and bilateral lower extremity weakness for a little more than 1 week. History of prostate cancer.  EXAM: MRI CERVICAL AND THORACIC SPINE WITHOUT CONTRAST  TECHNIQUE: Multiplanar and multiecho pulse sequences of the cervical and thoracic spine were obtained without intravenous contrast.  COMPARISON:  None.  FINDINGS: MR CERVICAL SPINE FINDINGS  The study is degraded by patient motion. The patient is status post C4-7 anterior discectomy and fusion. There is straightening of the normal cervical lordosis but vertebral body height and alignment are maintained. The craniocervical junction is normal. Cervical cord signal is unremarkable. The patient has a congenitally narrow central spinal canal. Imaged paraspinous structures are unremarkable.  C2-3: Shallow disc bulge without central canal or foraminal narrowing.  C3-4: Disc osteophyte complex  narrows the ventral thecal sac but the central canal and foramina appear open.  C4-5: Status post discectomy and fusion. The central canal is open. Uncovertebral spurring causes mild to moderate foraminal narrowing, more notable on the right.  C5-6: Status post discectomy and fusion. The central canal is patent. Uncovertebral spurring causes moderate to moderately severe foraminal narrowing, worse on the right.  C6-7: Status post discectomy and fusion. Endplate spurring and uncovertebral disease are seen. The ventral cord is mildly deformed. Moderately severe to severe bilateral foraminal narrowing is identified.  C7-T1: Shallow disc bulge endplate spur. The central canal is mildly narrowed. Moderate to moderately severe bilateral foraminal narrowing appears worse on the right.  MR THORACIC SPINE FINDINGS  Vertebral body height, signal and alignment are maintained. There is signal abnormality centrally within the cord from approximately the T3-4 level through the superior aspect of T5. The cord appears anteriorly positioned against the T4 vertebral body which is likely due to normal kyphosis. The thoracic cord is otherwise unremarkable. The central canal and neural foramina appear patent at all levels. Small central protrusion at T8-9  and shallow right paracentral protrusion T9-10 are noted.  IMPRESSION: Signal abnormality within the thoracic cord from the T3-4 through upper T5 level is nonspecific. Main differential considerations are cord infarct versus transverse myelitis. The cord appears anteriorly positioned this level which is likely due to kyphosis rather than a ventral dural defect. Post-contrast and diffusion imaging of the thoracic cord are recommended.  Status post C4-7 fusion. Uncovertebral and endplate spurring cause foraminal narrowing at the fusion levels. The cord is mildly deformed at the C6-7 level as described above.  Critical Value/emergent results were called by telephone at the time of  interpretation on 08/12/2014 at 9:13 am to Dr. Alexis Goodell , who verbally acknowledged these results.   Electronically Signed   By: Inge Rise M.D.   On: 08/12/2014 09:16   Mr Thoracic Spine Wo Contrast  08/12/2014   CLINICAL DATA:  Numbness and bilateral lower extremity weakness for a little more than 1 week. History of prostate cancer.  EXAM: MRI CERVICAL AND THORACIC SPINE WITHOUT CONTRAST  TECHNIQUE: Multiplanar and multiecho pulse sequences of the cervical and thoracic spine were obtained without intravenous contrast.  COMPARISON:  None.  FINDINGS: MR CERVICAL SPINE FINDINGS  The study is degraded by patient motion. The patient is status post C4-7 anterior discectomy and fusion. There is straightening of the normal cervical lordosis but vertebral body height and alignment are maintained. The craniocervical junction is normal. Cervical cord signal is unremarkable. The patient has a congenitally narrow central spinal canal. Imaged paraspinous structures are unremarkable.  C2-3: Shallow disc bulge without central canal or foraminal narrowing.  C3-4: Disc osteophyte complex narrows the ventral thecal sac but the central canal and foramina appear open.  C4-5: Status post discectomy and fusion. The central canal is open. Uncovertebral spurring causes mild to moderate foraminal narrowing, more notable on the right.  C5-6: Status post discectomy and fusion. The central canal is patent. Uncovertebral spurring causes moderate to moderately severe foraminal narrowing, worse on the right.  C6-7: Status post discectomy and fusion. Endplate spurring and uncovertebral disease are seen. The ventral cord is mildly deformed. Moderately severe to severe bilateral foraminal narrowing is identified.  C7-T1: Shallow disc bulge endplate spur. The central canal is mildly narrowed. Moderate to moderately severe bilateral foraminal narrowing appears worse on the right.  MR THORACIC SPINE FINDINGS  Vertebral body height, signal  and alignment are maintained. There is signal abnormality centrally within the cord from approximately the T3-4 level through the superior aspect of T5. The cord appears anteriorly positioned against the T4 vertebral body which is likely due to normal kyphosis. The thoracic cord is otherwise unremarkable. The central canal and neural foramina appear patent at all levels. Small central protrusion at T8-9 and shallow right paracentral protrusion T9-10 are noted.  IMPRESSION: Signal abnormality within the thoracic cord from the T3-4 through upper T5 level is nonspecific. Main differential considerations are cord infarct versus transverse myelitis. The cord appears anteriorly positioned this level which is likely due to kyphosis rather than a ventral dural defect. Post-contrast and diffusion imaging of the thoracic cord are recommended.  Status post C4-7 fusion. Uncovertebral and endplate spurring cause foraminal narrowing at the fusion levels. The cord is mildly deformed at the C6-7 level as described above.  Critical Value/emergent results were called by telephone at the time of interpretation on 08/12/2014 at 9:13 am to Dr. Alexis Goodell , who verbally acknowledged these results.   Electronically Signed   By: Inge Rise M.D.  On: 08/12/2014 09:16   Mr Thoracic Spine W Contrast  08/12/2014   CLINICAL DATA:  Patient with history of prostate cancer. Acute onset of pain radiating from rectum to penis. On waking the next day, patient had numbness from waist down.  EXAM: MRI THORACIC SPINE WITH CONTRAST  TECHNIQUE: Multiplanar and multiecho pulse sequences of the thoracic spine were obtained with intravenous contrast.  CONTRAST:  20m MULTIHANCE GADOBENATE DIMEGLUMINE 529 MG/ML IV SOLN  COMPARISON:  08/12/2014 cervical and thoracic MRI.  FINDINGS: This study compliments the previous cervical and thoracic MRI. Counting was performed from the craniocervical junction. C4 through C7 ACDF. Bone marrow signal shows  heterogenous marrow. This is a nonspecific finding most commonly associated with obesity, anemia, cigarette smoking or chronic disease. No enhancing aggressive osseous lesions are present. There is enhancement of the T1 superior endplate which is compatible with adjacent segment disease from the relatively long segment cervical fusion above. No findings to suggest bony metastatic disease.  Thoracic cord is swollen with increased intramedullary signal extending from T3-T4 through the mid T5 vertebra. This area demonstrates post gadolinium enhancement. No intramedullary hematoma. There is increased diffusion signal on diffusion-weighted imaging which could be associated with either infarct or transverse myelitis. No evidence of AVM. On the axial images, the cord enhancement is central in the lesion. No thoracic spinal stenosis.  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 Central enhancement after gadolinium administration with probable restricted diffusion. The differential considerations are unchanged and this pre and post gadolinium study does not establish a conclusive diagnosis. Most likely differential considerations are cord infarct versus transverse myelitis. Given these differential considerations, the clinical presentation would probably be more helpful in narrowing the differential diagnosis.   Electronically Signed   By: GDereck LigasM.D.   On: 08/12/2014 12:48   Mr Lumbar Spine W Wo Contrast  08/11/2014   CLINICAL DATA:  Numbness and tingling both legs. Prior back surgery.  EXAM: MRI LUMBAR SPINE WITHOUT AND WITH CONTRAST  TECHNIQUE: Multiplanar and multiecho pulse sequences of the lumbar spine were obtained without and with intravenous contrast.  CONTRAST:  244mMULTIHANCE GADOBENATE DIMEGLUMINE 529 MG/ML IV SOLN  COMPARISON:  Lumbar radiographs 12/14/2007, CT myelogram 04/11/2004, lumbar MRI 03/24/2004  FINDINGS: Based on prior radiographs, 6  non-rib-bearing lumbar segments are present. The lowest of these will be labeled S1, consistent with prior MRI and myelogram. Lowest disc space is S1-S2.  Straightening of the lumbar lordosis. Negative for fracture or mass. Multilevel degenerative change in the lumbar spine with extensive bone marrow signal abnormalities compatible with disc degeneration. Conus medullaris is normal and terminates at L1-2.  L1-2:  Mild disc and mild facet degeneration without spinal stenosis  L2-3: Diffuse disc bulging and spondylosis. Prominent right lateral osteophyte and disc complex similar to 2006. Bilateral facet degeneration. Small right-sided disc protrusion unchanged from the prior MRI. Mild spinal stenosis.  L3-4: Disc degeneration and spondylosis with diffuse endplate osteophyte formation. Bilateral facet hypertrophy. Improvement in right-sided disc protrusion since the prior studies. Moderate spinal stenosis. Moderate facet hypertrophy bilaterally. Foraminal encroachment is present bilaterally  L4-5: Diffuse disc bulging and spondylosis. Moderate spinal stenosis. Subarticular stenosis bilaterally. No change from the prior studies  L5-S1: Prior laminotomy on the right. Disc degeneration and spurring right greater than left with subarticular and foraminal stenosis on the right. Moderate spinal stenosis unchanged from the prior study.  L5-S1: Disc and facet degeneration. No significant disc protrusion or stenosis.  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  The lowest disc space is labeled L5-S1.  L2-3 disc and facet degeneration and mild spinal stenosis unchanged. Small right-sided disc protrusion L2-3 unchanged.  L3-4 disc and facet degeneration with moderate spinal stenosis. Interval improvement in right-sided disc protrusion since 2006.  Moderate spinal stenosis L4-5 due to spondylosis and facet hypertrophy.  Moderate spinal stenosis L5-S1 unchanged from the prior study.   Electronically Signed   By: Franchot Gallo M.D.   On:  08/11/2014 18:54    Scheduled Meds: . finasteride  5 mg Oral Daily  . ipratropium-albuterol  3 mL Nebulization TID  . methylPREDNISolone (SOLU-MEDROL) injection  1,000 mg Intravenous Daily  . pantoprazole  40 mg Oral Daily  . pneumococcal 23 valent vaccine  0.5 mL Intramuscular Tomorrow-1000  . sertraline  100 mg Oral Daily  . terazosin  5 mg Oral QHS   Continuous Infusions:   Time spent: 25 minutes  Nakyia Dau, [facility]  [facility] [email or web address], password [facility] 08/15/2014, 11:25 AM  LOS: 2 days

## 2014-08-15 NOTE — Consult Note (Signed)
Physical Medicine and Rehabilitation Consult Reason for Consult: Transverse myelitis versus possible cord infarct Referring Physician: [facility]   HPI: Jeremy Landry is a 69 y.o. right handed male with history of hypertension as well as prostate cancer. Patient lives with his brother uses a single-point cane  prior to admission. Presented 08/12/2014 with numbness bilateral lower extremities 1 week as well as decreased sensation for bowel or bladder. Cranial CT scan negative for acute changes. MRI lumbar spine showed some disc and facet degeneration at L2-3 and moderate spinal stenosis L3-4 as well as L5-S1. 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 Central enhancement after gadolinium administration with probable restricted diffusion. Initial LP was unsuccessful. Interventional radiology consulted to do LP but due to MRI findings they did not feel there was an indication to complete lumbar puncture. Neurology service is consulted suspect transverse myelitis versus possible cord infarct. Placed on intravenous Solu-Medrol 5 days. Physical therapy evaluation completed 08/13/2014 noting some desaturations of oxygen 85% as well as heart rate in the 120s and monitor closely. Recommendations were made for physical medicine rehabilitation consult.   Review of Systems  Constitutional: Negative for fever and chills.  HENT: Negative for hearing loss.   Eyes: Negative for blurred vision and double vision.  Respiratory: Negative for cough and shortness of breath.   Cardiovascular: Negative for chest pain and palpitations.  Gastrointestinal: Positive for nausea and constipation.       GERD  Genitourinary: Positive for urgency and frequency.  Musculoskeletal: Positive for myalgias and joint pain.  Skin: Negative for rash.  Neurological: Negative for headaches.       Decreased sensation lower extremities 1 week. Decreased sensation for bowel or bladder  movements.   Past Medical History  Diagnosis Date  . Hypertension   . GERD (gastroesophageal reflux disease)   . Prostate cancer    Past Surgical History  Procedure Laterality Date  . Back surgery    . Neck surgery     Family History  Problem Relation Age of Onset  . Cancer Brother    Social History:  reports that he has been smoking.  He does not have any smokeless tobacco history on file. He reports that he does not drink alcohol or use illicit drugs. Allergies:  Allergies  Allergen Reactions  . Aspirin Anaphylaxis  . Shellfish Allergy Anaphylaxis   Medications Prior to Admission  Medication Sig Dispense Refill  . amLODipine (NORVASC) 10 MG tablet Take 10 mg by mouth daily.    . finasteride (PROSCAR) 5 MG tablet Take 5 mg by mouth daily.    Marland Kitchen loratadine (CLARITIN) 10 MG tablet Take 10 mg by mouth daily.    . naproxen (NAPROSYN) 500 MG tablet Take 500 mg by mouth 2 (two) times daily as needed. For pain    . omeprazole (PRILOSEC) 20 MG capsule Take 20 mg by mouth daily.    Marland Kitchen terazosin (HYTRIN) 5 MG capsule Take 5 mg by mouth at bedtime.    . sertraline (ZOLOFT) 100 MG tablet Take 100 mg by mouth daily.      Home: Home Living Family/patient expects to be discharged to:: Private residence Living Arrangements: Other relatives (brother; physically okay, however has caner) Available Help at Discharge: Family, Available 24 hours/day Type of Home: House Home Access: Stairs to enter [employer] of Steps: 4 Entrance Stairs-Rails: None Home Layout: One level Bathroom Shower/Tub: Chiropodist: Standard Bathroom Accessibility: Yes Home Equipment: Kasandra Knudsen -  single point, Walker - 2 wheels  Functional History: Prior Function Level of Independence: Independent with assistive device(s) Comments: Pt reports he had started using SPC intermittently.  He was mod I with ambulation, ADLs, driving, community activities and IADLs Functional Status:   Mobility: Bed Mobility Overal bed mobility: Needs Assistance Bed Mobility: Rolling, Sidelying to Sit Rolling: Min guard Sidelying to sit: Min guard General bed mobility comments: Cues for technique to log roll onto Rt side, use rail, and bring LEs off of bed. Requires considerable effort to rise to seated position but did not require physical assist. Transfers Overall transfer level: Needs assistance Equipment used: Rolling walker (2 wheeled) Transfers: Sit to/from Stand Sit to Stand: Mod assist Stand pivot transfers: Mod assist General transfer comment: Pt requires mod  A to power up into standing.  He tends to hyperextend knees and demonstrates posterior bias causing LOB      ADL: ADL Overall ADL's : Needs assistance/impaired Eating/Feeding: Modified independent, Sitting Grooming: Wash/dry hands, Wash/dry face, Oral care, Brushing hair, Set up, Sitting Upper Body Bathing: Set up, Sitting Lower Body Bathing: Moderate assistance, Sit to/from stand Upper Body Dressing : Set up, Sitting Lower Body Dressing: Moderate assistance, Sit to/from stand Lower Body Dressing Details (indicate cue type and reason): Pt able to don/doff socks seated, but requires mod A for standing balance  Toilet Transfer: Moderate assistance, Stand-pivot, RW, BSC Functional mobility during ADLs: Moderate assistance, Rolling walker General ADL Comments: Pt very motivated to improve   Cognition: Cognition Overall Cognitive Status: Within Functional Limits for tasks assessed Orientation Level: Oriented X4 Cognition Arousal/Alertness: Awake/alert Behavior During Therapy: WFL for tasks assessed/performed Overall Cognitive Status: Within Functional Limits for tasks assessed  Blood pressure 154/72, pulse 57, temperature 98.2 F (36.8 C), temperature source Oral, resp. rate 18, height '6\' 2"'$  (1.88 m), weight 113.8 kg (250 lb 14.1 oz), SpO2 97 %. Physical Exam  Constitutional: He is oriented to person, place,  and time.  HENT:  Head: Normocephalic.  Eyes: EOM are normal.  Neck: Normal range of motion. Neck supple. No thyromegaly present.  Cardiovascular: Normal rate and regular rhythm.   Respiratory: Effort normal and breath sounds normal. No respiratory distress.  GI: Soft. Bowel sounds are normal. He exhibits no distension.  Neurological: He is alert and oriented to person, place, and time.  Follows commands  Skin: Skin is warm and dry.  Motor strength is 4/5 bilateral deltoids, biceps, triceps, grip 3 minus bilateral hip flexion and knee extension ankle dorsiflexion plantarflexion, sensation is normal above T4 reduced below T4 and absent in both feet  Results for orders placed or performed during the hospital encounter of 08/11/14 (from the past 24 hour(s))  Basic metabolic panel     Status: Abnormal   Collection Time: 08/14/14  6:35 AM  Result Value Ref Range   Sodium 136 135 - 145 mmol/L   Potassium 4.5 3.5 - 5.1 mmol/L   Chloride 102 101 - 111 mmol/L   CO2 25 22 - 32 mmol/L   Glucose, Bld 152 (H) 65 - 99 mg/dL   BUN 18 6 - 20 mg/dL   Creatinine, Ser 0.85 0.61 - 1.24 mg/dL   Calcium 9.6 8.9 - 10.3 mg/dL   GFR calc non Af Amer >60 >60 mL/min   GFR calc Af Amer >60 >60 mL/min   Anion gap 9 5 - 15  Lipid panel     Status: Abnormal   Collection Time: 08/14/14  6:35 AM  Result Value Ref Range  Cholesterol 165 0 - 200 mg/dL   Triglycerides 75 <150 mg/dL   HDL 41 >40 mg/dL   Total CHOL/HDL Ratio 4.0 RATIO   VLDL 15 0 - 40 mg/dL   LDL Cholesterol 109 (H) 0 - 99 mg/dL  Glucose, capillary     Status: Abnormal   Collection Time: 08/15/14  5:32 AM  Result Value Ref Range   Glucose-Capillary 119 (H) 65 - 99 mg/dL   Comment 1 Notify RN    Comment 2 Document in Chart    No results found.  Assessment/Plan: Diagnosis: Transverse myelitis with T4 incomplete paraplegia, neurogenic bowel 1. Does the need for close, 24 hr/day medical supervision in concert with the patient's rehab needs  make it unreasonable for this patient to be served in a less intensive setting? Yes 2. Co-Morbidities requiring supervision/potential complications: Hypoxia with desaturation, hypertension, prior cervical and lumbar surgeries 3. Due to bladder management, bowel management, safety, skin/wound care, disease management, medication administration, pain management and patient education, does the patient require 24 hr/day rehab nursing? Yes 4. Does the patient require coordinated care of a physician, rehab nurse, PT (1-2 hrs/day, 5 days/week) and OT (1-2 hrs/day, 5 days/week) to address physical and functional deficits in the context of the above medical diagnosis(es)? Yes Addressing deficits in the following areas: balance, endurance, locomotion, strength, transferring, bowel/bladder control, bathing, dressing, feeding, grooming and toileting 5. Can the patient actively participate in an intensive therapy program of at least 3 hrs of therapy per day at least 5 days per week? Yes 6. The potential for patient to make measurable gains while on inpatient rehab is excellent 7. Anticipated functional outcomes upon discharge from inpatient rehab are supervision  with PT, supervision with OT, n/a with SLP. 8. Estimated rehab length of stay to reach the above functional goals is: 10-14 days 9. Does the patient have adequate social supports and living environment to accommodate these discharge functional goals? Yes 10. Anticipated D/C setting: Home 11. Anticipated post D/C treatments: [facility] therapy 12. Overall Rehab/Functional Prognosis: excellent  RECOMMENDATIONS: This patient's condition is appropriate for continued rehabilitative care in the following setting: CIR Patient has agreed to participate in recommended program. Yes Note that insurance prior authorization may be required for reimbursement for recommended care.  Comment:     08/15/2014

## 2014-08-15 NOTE — Progress Notes (Signed)
Received prescreen request for inpatient rehab and noted that rehab consult has already been placed. An admission coordinator will follow up after consult is completed. Thanks.  Nanetta Batty, PT Rehabilitation Admissions Coordinator [phone]

## 2014-08-15 NOTE — Progress Notes (Signed)
   08/15/14 1300  Clinical Encounter Type  Visited With Patient  Visit Type Initial  Referral From Nurse  Advance Directives (For Healthcare)  Does patient have an advance directive? No  Would patient like information on creating an advanced directive? Yes - Educational materials given   Chaplain was referred to patient via spiritual care consult. Consult indicated patient would like to create or update an advanced directive. Patient was not aware of having a conversation about an advanced directive prior to chaplain's visit. However, patient did say that he planned to get his cousin to be his healthcare power of attorney. Chaplain went over the contents of an advanced directive with patient. Please call spiritual care department if patient wishes to complete document.  Aldona Bryner, Claudius Sis, Chaplain  1:49 PM

## 2014-08-15 NOTE — Progress Notes (Signed)
Physical Therapy Treatment Patient Details Name: Jeremy Landry MRN: [id number] DOB: [birth year] Today's Date: 08/15/2014    History of Present Illness 69 YO male with bilateral LE weakness for the past week that is stated to have abruptly started. 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     PT Comments    Pt is progressing towards PT goals. Pt demo an increase in ambulation tolerance today. SpO2 was checked before and after therapy session, both values were 90%. Pt continues to have truncal and LE ataxia and requires the use of a RW and one standing static rest break for energy conservation. Will benefit from DC destination to improve functional mobility for a safe transition back home.  Follow Up Recommendations  CIR     Equipment Recommendations       Recommendations for Other Services Rehab consult;OT consult     Precautions / Restrictions Precautions Precautions: Fall Restrictions Weight Bearing Restrictions: No    Mobility  Bed Mobility Overal bed mobility: Needs Assistance Bed Mobility: Supine to Sit     Supine to sit: Min guard     General bed mobility comments: pt able to sit up in bed using bed railing, increased time to complete task  Transfers Overall transfer level: Needs assistance Equipment used: Rolling walker (2 wheeled) Transfers: Sit to/from Stand Sit to Stand: Min assist         General transfer comment: min A help stand from EOB and to steady pt, VC for hand placement to proper sit to stand technique.  Ambulation/Gait Ambulation/Gait assistance: Min assist Ambulation Distance (Feet): 100 Feet Assistive device: Rolling walker (2 wheeled) Gait Pattern/deviations: Decreased step length - right;Ataxic;Leaning posteriorly;Narrow base of support Gait velocity: decreased Gait velocity interpretation: Below normal speed for age/gender General Gait Details: pt demo an truncal and  LE ataxic gt pattern, therapist provived posterior proximal support to provide truncal stability and to help encourage normal gt pattern.   Stairs            Wheelchair Mobility    Modified Rankin (Stroke Patients Only) Modified Rankin (Stroke Patients Only) Pre-Morbid Rankin Score: Slight disability Modified Rankin: Moderately severe disability     Balance Overall balance assessment: Needs assistance Sitting-balance support: No upper extremity supported;Feet supported Sitting balance-Leahy Scale: Good     Standing balance support: Bilateral upper extremity supported;During functional activity Standing balance-Leahy Scale: Poor                      Cognition Arousal/Alertness: Awake/alert Behavior During Therapy: WFL for tasks assessed/performed Overall Cognitive Status: Within Functional Limits for tasks assessed                      Exercises      General Comments General comments (skin integrity, edema, etc.): SpO2 before and after therapy session was at 90% on room air.      Pertinent Vitals/Pain Pain Assessment: No/denies pain    Home Living                      Prior Function            PT Goals (current goals can now be found in the care plan section) Acute Rehab PT Goals Patient Stated Goal: none stated during session Progress towards PT goals: Progressing toward goals    Frequency  Min 4X/week    PT Plan Current plan  remains appropriate    Co-evaluation             End of Session Equipment Utilized During Treatment: Gait belt Activity Tolerance: Patient tolerated treatment well Patient left: in chair;with call bell/phone within reach;with chair alarm set     Time: 1140-1210 PT Time Calculation (min) (ACUTE ONLY): 30 min  Charges:  $Gait Training: 23-37 mins                    G Codes:      Renaee Munda 2014-09-13, 2:19 PM   Renaee Munda, Dayle Points (student physical therapy assistant) Acute  Rehab [phone]

## 2014-08-15 NOTE — Progress Notes (Signed)
Subjective: patient reporting he is feeling slightly stronger in LE and has increased feeling between his perineum and umbilical region.   Pending ANA and NMO antibody  Objective: Current vital signs: BP 154/72 mmHg  Pulse 57  Temp(Src) 98.2 F (36.8 C) (Oral)  Resp 18  Ht '6\' 2"'$  (1.88 m)  Wt 113.8 kg (250 lb 14.1 oz)  BMI 32.20 kg/m2  SpO2 97% Vital signs in last 24 hours: Temp:  [98 F (36.7 C)-98.6 F (37 C)] 98.2 F (36.8 C) (08/08 0534) Pulse Rate:  [57-105] 57 (08/08 0534) Resp:  [18-20] 18 (08/08 0534) BP: (126-154)/(45-77) 154/72 mmHg (08/08 0534) SpO2:  [85 %-97 %] 97 % (08/08 0534)  Intake/Output from previous day:   Intake/Output this shift:   Nutritional status: Diet 2 gram sodium Room service appropriate?: Yes; Fluid consistency:: Thin  Neurologic Exam: Mental Status: Alert, oriented, thought content appropriate. Speech fluent without evidence of aphasia. Able to follow 3 step commands without difficulty. Cranial Nerves: II: visual fields intact, pupils equal, round, reactive to light and accommodation III,IV, VI: ptosis not present, extra-ocular motions intact bilaterally V,VII: decrease in left NLF, facial light touch sensation normal bilaterally Motor: Right :Upper extremity 5/5Left: Upper extremity 5/5 Lower extremity 4-/5 Proximal> distalLower extremity 4-/5 Proximal> distal Tone and bulk:normal tone throughout; no atrophy noted Sensory: Pinprick and light touch decreased to below the umbilicus,   Lab Results: Basic Metabolic Panel:  Recent Labs Lab 08/11/14 1400 08/12/14 0029 08/12/14 1515 08/14/14 0635  NA 139 141 138 136  K 3.4* 3.6 3.3* 4.5  CL 106 106 102 102  CO2 '26 27 29 25  '$ GLUCOSE 106* 103* 131* 152*  BUN '13 12 10 18  '$ CREATININE 1.04 0.87 0.92 0.85  CALCIUM 9.4 9.6 9.3 9.6    Liver Function Tests:  Recent Labs Lab 08/12/14 0029  08/12/14 1515  AST 17 18  ALT 15* 14*  ALKPHOS 53 52  BILITOT 0.8 0.6  PROT 7.3 7.0  ALBUMIN 3.8 3.6   No results for input(s): LIPASE, AMYLASE in the last 168 hours. No results for input(s): AMMONIA in the last 168 hours.  CBC:  Recent Labs Lab 08/11/14 1400 08/12/14 0029  WBC 5.3 6.0  NEUTROABS 3.2  --   HGB 11.9* 12.2*  HCT 36.1* 35.9*  MCV 93.3 92.1  PLT 240 237    Cardiac Enzymes: No results for input(s): CKTOTAL, CKMB, CKMBINDEX, TROPONINI in the last 168 hours.  Lipid Panel:  Recent Labs Lab 08/12/14 0532 08/14/14 0635  CHOL 156 165  TRIG 167* 75  HDL 31* 41  CHOLHDL 5.0 4.0  VLDL 33 15  LDLCALC 92 109*    CBG:  Recent Labs Lab 08/13/14 0608 08/14/14 0528 08/15/14 0532  GLUCAP 144* 148* 119*    Microbiology: Results for orders placed or performed during the hospital encounter of 05/23/10  Urine culture     Status: None   Collection Time: 05/25/10  8:33 AM  Result Value Ref Range Status   Specimen Description URINE, CLEAN CATCH  Final   Special Requests NONE  Final   Culture  Setup Time [id number]  Final   Colony Count NO GROWTH  Final   Culture NO GROWTH  Final   Report Status 05/26/2010 FINAL  Final    Coagulation Studies: No results for input(s): LABPROT, INR in the last 72 hours.  Imaging: No results found.  Medications:  Scheduled: . finasteride  5 mg Oral Daily  . ipratropium-albuterol  3 mL Nebulization TID  .  methylPREDNISolone (SOLU-MEDROL) injection  1,000 mg Intravenous Daily  . pantoprazole  40 mg Oral Daily  . pneumococcal 23 valent vaccine  0.5 mL Intramuscular Tomorrow-1000  . sertraline  100 mg Oral Daily  . terazosin  5 mg Oral QHS    Assessment/Plan: 69 YO male with bilateral LE weakness for the past week that is stated to have abruptly started. 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 Central enhancement after gadolinium administration  with probable restricted diffusion. LP attempted at bedside was unsuccessful. IR was contacted to do a LP but due to MRI findings they did not feel there was an indication to do LP. 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 Today will be 4/5 doses solumedrol. ANA and NMO pending.  Primary placing CIR consult.   Recommend: 1) PT 2) finish Solumedrol 3) NMO and ANA pending.  4) Continue Plavix 5) goal LDL is <70 (patien LDL 109) consider statin   Etta Quill PA-C [facility] [phone]  08/15/2014, 9:00 AM  I personally participated in this patient's evaluation and management, including formulating the above clinical impression and management recommendations.  Rush Farmer M.D. [facility] [phone]

## 2014-08-15 NOTE — Care Management Important Message (Signed)
Important Message  Patient Details  Name: Jeremy Landry MRN: [id number] Date of Birth: 02/21/1945   Medicare Important Message Given:  Yes-second notification given    Delorse Lek 08/15/2014, 4:06 PM

## 2014-08-16 LAB — GLUCOSE, CAPILLARY: Glucose-Capillary: 151 mg/dL — ABNORMAL HIGH (ref 65–99)

## 2014-08-16 NOTE — Progress Notes (Signed)
Occupational Therapy Treatment Patient Details Name: Jeremy Landry MRN: [id number] DOB: 06/20/1945 Today's Date: 08/16/2014    History of present illness 69 YO male with bilateral LE weakness for the past week that is stated to have abruptly started. 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    OT comments  Pt progressing towards acute OT goals. Focus of session was BUE strengthening program, dynamic standing balance, and activity tolerance. Session detailed below. D/c plan to CIR remains appropriate from OT standpoint. OT to continue to follow acutely.   Follow Up Recommendations  CIR;Supervision/Assistance - 24 hour    Equipment Recommendations  3 in 1 bedside comode;Tub/shower bench    Recommendations for Other Services Rehab consult    Precautions / Restrictions Precautions Precautions: Fall Restrictions Weight Bearing Restrictions: No       Mobility Bed Mobility               General bed mobility comments: in recliner  Transfers Overall transfer level: Needs assistance Equipment used: Rolling walker (2 wheeled) Transfers: Sit to/from Stand Sit to Stand: Min assist              Balance Overall balance assessment: Needs assistance Sitting-balance support: No upper extremity supported;Feet supported Sitting balance-Leahy Scale: Good     Standing balance support: Bilateral upper extremity supported;During functional activity Standing balance-Leahy Scale: Poor Standing balance comment: external support for balance                   ADL Overall ADL's : Needs assistance/impaired     Grooming: Wash/dry face;Oral care;Standing;Set up;Sitting;Min guard (shaving) Grooming Details (indicate cue type and reason): Stood to complete shaving mostly at close min guard. Pt then sat for remaining ADLs.                 Toilet Transfer: Minimal assistance;Ambulation;Comfort height  toilet;Grab bars;RW           Functional mobility during ADLs: Minimal assistance;Rolling walker General ADL Comments: Pt completed theraband exercises as detailed below. Pt then ambulated to bathroom and completed grooming and toilet transfer as detailed above. Pt presents with decreased standing balance.      Vision                     Perception     Praxis      Cognition   Behavior During Therapy: WFL for tasks assessed/performed Overall Cognitive Status: Within Functional Limits for tasks assessed                       Extremity/Trunk Assessment               Exercises     Shoulder Instructions       General Comments      Pertinent Vitals/ Pain       Pain Assessment: Faces Faces Pain Scale: Hurts little more Pain Location: back pain standing at sink Pain Intervention(s): Limited activity within patient's tolerance;Monitored during session;Repositioned  Home Living                                          Prior Functioning/Environment              Frequency Min 3X/week     Progress Toward Goals  OT Goals(current goals can  now be found in the care plan section)  Progress towards OT goals: Progressing toward goals  Acute Rehab OT Goals Patient Stated Goal: none stated during session OT Goal Formulation: With patient Time For Goal Achievement: 08/27/14 Potential to Achieve Goals: Good ADL Goals Pt Will Perform Grooming: with min guard assist;standing Pt Will Perform Lower Body Bathing: with min guard assist;sit to/from stand Pt Will Perform Lower Body Dressing: with min guard assist;sit to/from stand Pt Will Transfer to Toilet: with min guard assist;ambulating;bedside commode;grab bars;regular height toilet Pt Will Perform Toileting - Clothing Manipulation and hygiene: with min guard assist;sit to/from stand Pt/caregiver will Perform Home Exercise Program: Increased strength;Right Upper extremity;Left upper  extremity;Independently;With written HEP provided;With theraband Additional ADL Goal #1: Pt will be independent with splint wear for Rt UE.   Plan Discharge plan remains appropriate    Co-evaluation                 End of Session Equipment Utilized During Treatment: Gait belt;Rolling walker;Oxygen   Activity Tolerance Patient tolerated treatment well   Patient Left in chair;with call bell/phone within reach;with chair alarm set   Nurse Communication          Time: [phone] OT Time Calculation (min): 37 min  Charges: OT General Charges $OT Visit: 1 Procedure OT Treatments $Self Care/Home Management : 23-37 mins  Hortencia Pilar 08/16/2014, 3:32 PM

## 2014-08-16 NOTE — Progress Notes (Signed)
Physical Therapy Treatment Patient Details Name: Jeremy Landry MRN: [id number] DOB: [birth year] Today's Date: 08/16/2014    History of Present Illness 69 YO male with bilateral LE weakness for the past week that is stated to have abruptly started. 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     PT Comments    Pt is Progressing toward PT goals. Pt demo increased ambulation tolerance today. Pt walked today with two person HHA. Demos scissoring, unsteadiness and ataxic gait patterns however gait improves with the use of a RW. Pt will benefit from Skilled PT to further improve pt functional mobility.  Follow Up Recommendations  CIR     Equipment Recommendations       Recommendations for Other Services Rehab consult;OT consult     Precautions / Restrictions Precautions Precautions: Fall Restrictions Weight Bearing Restrictions: No    Mobility  Bed Mobility Overal bed mobility: Needs Assistance Bed Mobility: Supine to Sit Rolling: Min guard         General bed mobility comments: pt was able to sit up in bed with the use of hand railing, with HOB elevated  Transfers Overall transfer level: Needs assistance Equipment used: Rolling walker (2 wheeled) Transfers: Sit to/from Stand Sit to Stand: Min assist         General transfer comment: vc to push up from bed and not walker. min A to steady pt  Ambulation/Gait Ambulation/Gait assistance: Min assist Ambulation Distance (Feet): 350 Feet (1/2 with RW and 1/2 with bilat HHA) Assistive device: Rolling walker (2 wheeled);2 person hand held assist Gait Pattern/deviations: Step-through pattern;Scissoring;Ataxic;Wide base of support;Narrow base of support Gait velocity: decreased Gait velocity interpretation: Below normal speed for age/gender General Gait Details: pt ambualted 350 with RW min A, VC for standing up straight and tighting trunk core for truck  stabilty. second lap pt ambulated with no AD using 2 person HHA , with therapist giving posterior hip and trunk stabilty. pt with increased unsteadiness, scissoring  and ataxia   Stairs            Wheelchair Mobility    Modified Rankin (Stroke Patients Only) Modified Rankin (Stroke Patients Only) Pre-Morbid Rankin Score: Moderate disability Modified Rankin: Moderately severe disability     Balance Overall balance assessment: Needs assistance Sitting-balance support: No upper extremity supported;Feet supported Sitting balance-Leahy Scale: Good     Standing balance support: Bilateral upper extremity supported;During functional activity Standing balance-Leahy Scale: Poor Standing balance comment: pt needs AD to maintain balance                    Cognition Arousal/Alertness: Awake/alert Behavior During Therapy: WFL for tasks assessed/performed Overall Cognitive Status: Within Functional Limits for tasks assessed                      Exercises General Exercises - Lower Extremity Ankle Circles/Pumps: AROM;Strengthening;Both;10 reps;Seated Heel Slides: AROM;Strengthening;Both;10 reps;Seated Hip Flexion/Marching: AROM;Strengthening;Both;10 reps;Seated    General Comments General comments (skin integrity, edema, etc.): pt brother was present in room      Pertinent Vitals/Pain Pain Assessment: No/denies pain    Home Living                      Prior Function            PT Goals (current goals can now be found in the care plan section) Acute Rehab PT Goals Patient Stated Goal:  none stated during session Progress towards PT goals: Progressing toward goals    Frequency  Min 4X/week    PT Plan Current plan remains appropriate    Co-evaluation             End of Session Equipment Utilized During Treatment: Gait belt Activity Tolerance: Patient tolerated treatment well Patient left: in chair;with call bell/phone within reach;with  chair alarm set;with family/visitor present     Time: 5670-1410 PT Time Calculation (min) (ACUTE ONLY): 25 min  Charges:  $Gait Training: 8-22 mins $Therapeutic Exercise: 8-22 mins                    G Codes:      Eugenio Dollins 08/19/14, 1:15 PM   Renaee Munda, [place] (student physical therapy assistant) Acute Rehab [phone]

## 2014-08-16 NOTE — Progress Notes (Signed)
[facility] PROGRESS NOTE  Jeremy Landry TKZ:[id number] DOB: May 25, 1945 DOA: 08/11/2014 PCP: Default, Provider, MD  Summary  69 y.o. male with Past medical history of hypertension, GERD, prostate cancer, multiple back surgeries. The patient is presenting with numbness of bilateral lower leg numbness that has been ongoing since last one week.  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  - Neurology consulted and following. They're waiting for ANA and and NMO antibody - plan is to continue IV Solu-Medrol for 5 days currently day 5 of 5  - Plavix also started recently as patient has aspirin allergy  - CIR consult placed and currently patient awaiting disposition acceptance into CIR - start on statin given goal of < 70 currently 109.  Last ast and alt 18 and 14  Active Problems: HTN  - Amlodipine currently on hold, and blood pressures relatively well controlled  -  stable placed on low salt diet low sodium  GERD -Stable, continue PPI    Smoker -Plan will be to recommend tobacco cessation  Hyperkalemia -  Replace and reassess next am.  Code Status:  full Family Communication:  None at bedside Disposition Plan:  Pending improvement in condition and evaluation by CIR physician  Consultants:  Neuro  CIR  Procedures:   None  Antibiotics:  None  HPI/Subjective: No new complaints, no acute problems reported to me by patient.  Objective: Filed Vitals:   08/16/14 1030  BP: 119/58  Pulse: 73  Temp: 98.9 F (37.2 C)  Resp: 18    Intake/Output Summary (Last 24 hours) at 08/16/14 1417 Last data filed at 08/16/14 0603  Gross per 24 hour  Intake      0 ml  Output    800 ml  Net   -800 ml   Filed Weights   08/12/14 0220  Weight: 113.8 kg (250 lb 14.1 oz)    Exam:   General:  Awake and alert, in  nad  Cardiovascular: RRR without MGR  Respiratory: CTA, no wheezes, equal chest rise  Abdomen: NT, ND, soft  Ext: no edema  Neuro:no facial asymmetry, answers questions appropriately, sensation to light touch intact at LE's  Basic Metabolic Panel:  Recent Labs Lab 08/11/14 1400 08/12/14 0029 08/12/14 1515 08/14/14 0635  NA 139 141 138 136  K 3.4* 3.6 3.3* 4.5  CL 106 106 102 102  CO2 '26 27 29 25  '$ GLUCOSE 106* 103* 131* 152*  BUN '13 12 10 18  '$ CREATININE 1.04 0.87 0.92 0.85  CALCIUM 9.4 9.6 9.3 9.6   Liver Function Tests:  Recent Labs Lab 08/12/14 0029 08/12/14 1515  AST 17 18  ALT 15* 14*  ALKPHOS 53 52  BILITOT 0.8 0.6  PROT 7.3 7.0  ALBUMIN 3.8 3.6   No results for input(s): LIPASE, AMYLASE in the last 168 hours. No results for input(s): AMMONIA in the last 168 hours. CBC:  Recent Labs Lab 08/11/14 1400 08/12/14 0029  WBC 5.3 6.0  NEUTROABS 3.2  --   HGB 11.9* 12.2*  HCT 36.1* 35.9*  MCV 93.3 92.1  PLT 240 237   Cardiac Enzymes: No results for input(s): CKTOTAL, CKMB, CKMBINDEX, TROPONINI in the last 168 hours. BNP (last 3 results) No results for input(s): BNP in the last 8760 hours.  ProBNP (last 3 results) No results for input(s): PROBNP in the last 8760 hours.  CBG:  Recent Labs Lab 08/13/14 0608 08/14/14 0528 08/15/14 0532 08/16/14 0632  GLUCAP 144* 148* 119* 151*    No results found for this or any previous visit (from the past 240 hour(s)).   Studies: Dg Chest 2 View  08/11/2014   CLINICAL DATA:  Chronic shortness of breath. Chronic left upper extremity region pain. Hypertension.  EXAM: CHEST  2 VIEW  COMPARISON:  May 25, 2010  FINDINGS: There is slight scarring in the right and left lower lobe regions. There is no edema or consolidation. Heart size and pulmonary vascularity are normal. No adenopathy. There is postoperative change in the lower cervical region.  IMPRESSION: Areas of mild scarring in the lower lobes bilaterally. No  edema or consolidation.   Electronically Signed   By: Lowella Grip III M.D.   On: 08/11/2014 14:42   Ct Head Wo Contrast  08/12/2014   CLINICAL DATA:  Numb waistline for 5 days. History of hypertension and prostate cancer. Current smoker.  EXAM: CT HEAD WITHOUT CONTRAST  TECHNIQUE: Contiguous axial images were obtained from the base of the skull through the vertex without intravenous contrast.  COMPARISON:  MRI of the head May 24, 2010  FINDINGS: The ventricles and sulci are normal for age. No intraparenchymal hemorrhage, mass effect nor midline shift. Patchy supratentorial white matter hypodensities are within normal range for patient's age and though non-specific suggest sequelae of chronic small vessel ischemic disease. No acute large vascular territory infarcts.  No abnormal extra-axial fluid collections. Basal cisterns are patent. Mild calcific atherosclerosis of the carotid siphons.  No skull fracture. The included ocular globes and orbital contents are non-suspicious. Moderate paranasal sinus mucosal thickening without air-fluid levels. The mastoid air cells are well aerated.  IMPRESSION: No acute intracranial process.  Involutional changes. Moderate white matter changes compatible with chronic small vessel ischemic disease.   Electronically Signed   By: Elon Alas M.D.   On: 08/12/2014 01:24   Mr Cervical Spine Wo Contrast  08/12/2014   CLINICAL DATA:  Numbness and bilateral lower extremity weakness for a little more than 1 week. History of prostate cancer.  EXAM: MRI CERVICAL AND THORACIC SPINE WITHOUT CONTRAST  TECHNIQUE: Multiplanar and multiecho pulse sequences of the cervical and thoracic spine were obtained without intravenous contrast.  COMPARISON:  None.  FINDINGS: MR CERVICAL SPINE FINDINGS  The study is degraded by patient motion. The patient is status post C4-7 anterior discectomy and fusion. There is straightening of the normal cervical lordosis but vertebral body height and  alignment are maintained. The craniocervical junction is normal. Cervical cord signal is unremarkable. The patient has a congenitally narrow central spinal canal. Imaged paraspinous structures are unremarkable.  C2-3: Shallow disc bulge without central canal or foraminal narrowing.  C3-4: Disc osteophyte complex narrows the ventral thecal sac but the central canal and foramina appear open.  C4-5: Status post discectomy and fusion. The central canal is open. Uncovertebral spurring causes mild to moderate foraminal narrowing, more notable on the right.  C5-6: Status post discectomy and fusion. The central canal is patent. Uncovertebral spurring causes moderate to moderately severe foraminal narrowing, worse on the right.  C6-7: Status post discectomy and fusion. Endplate spurring and uncovertebral disease are seen. The ventral cord is mildly deformed. Moderately severe to severe bilateral foraminal narrowing is identified.  C7-T1: Shallow disc bulge endplate spur. The central canal is mildly narrowed. Moderate to moderately severe bilateral foraminal narrowing appears worse on the right.  MR THORACIC SPINE FINDINGS  Vertebral  body height, signal and alignment are maintained. There is signal abnormality centrally within the cord from approximately the T3-4 level through the superior aspect of T5. The cord appears anteriorly positioned against the T4 vertebral body which is likely due to normal kyphosis. The thoracic cord is otherwise unremarkable. The central canal and neural foramina appear patent at all levels. Small central protrusion at T8-9 and shallow right paracentral protrusion T9-10 are noted.  IMPRESSION: Signal abnormality within the thoracic cord from the T3-4 through upper T5 level is nonspecific. Main differential considerations are cord infarct versus transverse myelitis. The cord appears anteriorly positioned this level which is likely due to kyphosis rather than a ventral dural defect. Post-contrast  and diffusion imaging of the thoracic cord are recommended.  Status post C4-7 fusion. Uncovertebral and endplate spurring cause foraminal narrowing at the fusion levels. The cord is mildly deformed at the C6-7 level as described above.  Critical Value/emergent results were called by telephone at the time of interpretation on 08/12/2014 at 9:13 am to Dr. Alexis Goodell , who verbally acknowledged these results.   Electronically Signed   By: Inge Rise M.D.   On: 08/12/2014 09:16   Mr Thoracic Spine Wo Contrast  08/12/2014   CLINICAL DATA:  Numbness and bilateral lower extremity weakness for a little more than 1 week. History of prostate cancer.  EXAM: MRI CERVICAL AND THORACIC SPINE WITHOUT CONTRAST  TECHNIQUE: Multiplanar and multiecho pulse sequences of the cervical and thoracic spine were obtained without intravenous contrast.  COMPARISON:  None.  FINDINGS: MR CERVICAL SPINE FINDINGS  The study is degraded by patient motion. The patient is status post C4-7 anterior discectomy and fusion. There is straightening of the normal cervical lordosis but vertebral body height and alignment are maintained. The craniocervical junction is normal. Cervical cord signal is unremarkable. The patient has a congenitally narrow central spinal canal. Imaged paraspinous structures are unremarkable.  C2-3: Shallow disc bulge without central canal or foraminal narrowing.  C3-4: Disc osteophyte complex narrows the ventral thecal sac but the central canal and foramina appear open.  C4-5: Status post discectomy and fusion. The central canal is open. Uncovertebral spurring causes mild to moderate foraminal narrowing, more notable on the right.  C5-6: Status post discectomy and fusion. The central canal is patent. Uncovertebral spurring causes moderate to moderately severe foraminal narrowing, worse on the right.  C6-7: Status post discectomy and fusion. Endplate spurring and uncovertebral disease are seen. The ventral cord is mildly  deformed. Moderately severe to severe bilateral foraminal narrowing is identified.  C7-T1: Shallow disc bulge endplate spur. The central canal is mildly narrowed. Moderate to moderately severe bilateral foraminal narrowing appears worse on the right.  MR THORACIC SPINE FINDINGS  Vertebral body height, signal and alignment are maintained. There is signal abnormality centrally within the cord from approximately the T3-4 level through the superior aspect of T5. The cord appears anteriorly positioned against the T4 vertebral body which is likely due to normal kyphosis. The thoracic cord is otherwise unremarkable. The central canal and neural foramina appear patent at all levels. Small central protrusion at T8-9 and shallow right paracentral protrusion T9-10 are noted.  IMPRESSION: Signal abnormality within the thoracic cord from the T3-4 through upper T5 level is nonspecific. Main differential considerations are cord infarct versus transverse myelitis. The cord appears anteriorly positioned this level which is likely due to kyphosis rather than a ventral dural defect. Post-contrast and diffusion imaging of the thoracic cord are recommended.  Status post C4-7  fusion. Uncovertebral and endplate spurring cause foraminal narrowing at the fusion levels. The cord is mildly deformed at the C6-7 level as described above.  Critical Value/emergent results were called by telephone at the time of interpretation on 08/12/2014 at 9:13 am to Dr. Alexis Goodell , who verbally acknowledged these results.   Electronically Signed   By: Inge Rise M.D.   On: 08/12/2014 09:16   Mr Thoracic Spine W Contrast  08/12/2014   CLINICAL DATA:  Patient with history of prostate cancer. Acute onset of pain radiating from rectum to penis. On waking the next day, patient had numbness from waist down.  EXAM: MRI THORACIC SPINE WITH CONTRAST  TECHNIQUE: Multiplanar and multiecho pulse sequences of the thoracic spine were obtained with intravenous  contrast.  CONTRAST:  81m MULTIHANCE GADOBENATE DIMEGLUMINE 529 MG/ML IV SOLN  COMPARISON:  08/12/2014 cervical and thoracic MRI.  FINDINGS: This study compliments the previous cervical and thoracic MRI. Counting was performed from the craniocervical junction. C4 through C7 ACDF. Bone marrow signal shows heterogenous marrow. This is a nonspecific finding most commonly associated with obesity, anemia, cigarette smoking or chronic disease. No enhancing aggressive osseous lesions are present. There is enhancement of the T1 superior endplate which is compatible with adjacent segment disease from the relatively long segment cervical fusion above. No findings to suggest bony metastatic disease.  Thoracic cord is swollen with increased intramedullary signal extending from T3-T4 through the mid T5 vertebra. This area demonstrates post gadolinium enhancement. No intramedullary hematoma. There is increased diffusion signal on diffusion-weighted imaging which could be associated with either infarct or transverse myelitis. No evidence of AVM. On the axial images, the cord enhancement is central in the lesion. No thoracic spinal stenosis.  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 Central enhancement after gadolinium administration with probable restricted diffusion. The differential considerations are unchanged and this pre and post gadolinium study does not establish a conclusive diagnosis. Most likely differential considerations are cord infarct versus transverse myelitis. Given these differential considerations, the clinical presentation would probably be more helpful in narrowing the differential diagnosis.   Electronically Signed   By: GDereck LigasM.D.   On: 08/12/2014 12:48   Mr Lumbar Spine W Wo Contrast  08/11/2014   CLINICAL DATA:  Numbness and tingling both legs. Prior back surgery.  EXAM: MRI LUMBAR SPINE WITHOUT AND WITH CONTRAST  TECHNIQUE: Multiplanar and  multiecho pulse sequences of the lumbar spine were obtained without and with intravenous contrast.  CONTRAST:  23mMULTIHANCE GADOBENATE DIMEGLUMINE 529 MG/ML IV SOLN  COMPARISON:  Lumbar radiographs 12/14/2007, CT myelogram 04/11/2004, lumbar MRI 03/24/2004  FINDINGS: Based on prior radiographs, 6 non-rib-bearing lumbar segments are present. The lowest of these will be labeled S1, consistent with prior MRI and myelogram. Lowest disc space is S1-S2.  Straightening of the lumbar lordosis. Negative for fracture or mass. Multilevel degenerative change in the lumbar spine with extensive bone marrow signal abnormalities compatible with disc degeneration. Conus medullaris is normal and terminates at L1-2.  L1-2:  Mild disc and mild facet degeneration without spinal stenosis  L2-3: Diffuse disc bulging and spondylosis. Prominent right lateral osteophyte and disc complex similar to 2006. Bilateral facet degeneration. Small right-sided disc protrusion unchanged from the prior MRI. Mild spinal stenosis.  L3-4: Disc degeneration and spondylosis with diffuse endplate osteophyte formation. Bilateral facet hypertrophy. Improvement in right-sided disc protrusion since the prior studies. Moderate spinal stenosis. Moderate facet hypertrophy bilaterally. Foraminal encroachment is present bilaterally  L4-5:  Diffuse disc bulging and spondylosis. Moderate spinal stenosis. Subarticular stenosis bilaterally. No change from the prior studies  L5-S1: Prior laminotomy on the right. Disc degeneration and spurring right greater than left with subarticular and foraminal stenosis on the right. Moderate spinal stenosis unchanged from the prior study.  L5-S1: Disc and facet degeneration. No significant disc protrusion or stenosis.  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  The lowest disc space is labeled L5-S1.  L2-3 disc and facet degeneration and mild spinal stenosis unchanged. Small right-sided disc protrusion L2-3 unchanged.  L3-4 disc and facet  degeneration with moderate spinal stenosis. Interval improvement in right-sided disc protrusion since 2006.  Moderate spinal stenosis L4-5 due to spondylosis and facet hypertrophy.  Moderate spinal stenosis L5-S1 unchanged from the prior study.   Electronically Signed   By: Franchot Gallo M.D.   On: 08/11/2014 18:54    Scheduled Meds: . finasteride  5 mg Oral Daily  . ipratropium-albuterol  3 mL Nebulization TID  . pantoprazole  40 mg Oral Daily  . pneumococcal 23 valent vaccine  0.5 mL Intramuscular Tomorrow-1000  . sertraline  100 mg Oral Daily  . simvastatin  20 mg Oral q1800  . terazosin  5 mg Oral QHS   Continuous Infusions:   Time spent: 25 minutes  Michale Emmerich, [facility]  [facility] [email or web address], password [facility] 08/16/2014, 2:17 PM  LOS: 3 days

## 2014-08-16 NOTE — Progress Notes (Signed)
Rehab admissions - I met with pt and his brother in follow up to rehab MD consult to explain the possibility of inpatient rehab. Further details were given about our rehab program and informational brochures were given. Pt has [employer] Medicare and I explained that pt will have a copay associated with CIR versus no copay at SNF. Pt stated he would like to consider both options in light of copay issue.  I will now open his case with [employer] to seek insurance authorization for inpatient rehab.   I will check on pt's status tomorrow and his preference for CIR vs SNF. We will consider possible inpatient rehab admit pending pt's preference for CIR, his medical clearance, insurance approval and our bed availability. We may likely have limited beds tomorrow. Thanks.  Nanetta Batty, PT Rehabilitation Admissions Coordinator [phone]

## 2014-08-17 MED ORDER — SIMVASTATIN 20 MG PO TABS: 20.0000 mg | ORAL_TABLET | Freq: Every day | ORAL | Status: DC

## 2014-08-17 MED ORDER — CLOPIDOGREL BISULFATE 75 MG PO TABS
75.0000 mg | ORAL_TABLET | Freq: Every day | ORAL | Status: DC
Start: 2014-08-17 — End: 2014-08-17
  Administered 2014-08-17: 75 mg via ORAL
  Filled 2014-08-17: qty 1

## 2014-08-17 MED ORDER — AMLODIPINE BESYLATE 5 MG PO TABS
5.0000 mg | ORAL_TABLET | Freq: Every day | ORAL | Status: DC
Start: 2014-08-17 — End: 2017-01-23

## 2014-08-17 MED ORDER — CLOPIDOGREL BISULFATE 75 MG PO TABS: 75.0000 mg | ORAL_TABLET | Freq: Every day | ORAL | Status: DC

## 2014-08-17 NOTE — Clinical Social Work Note (Signed)
[facility] has NOT received insurance authorization at this time. Patient declined placement at [facility] and reported he strongly desires placement at [facility] due to his mother also being located at facility. Patient reported he will return home if he cannot be admitted into [facility].   Patient is requiring O2 however does NOT qualify under [facility] Medicare. CSW AD, RNCM, and bedside RN notified.  CSW remains available as needed.    Glendon Axe, MSW, LCSWA [phone] 08/17/2014 4:19 PM

## 2014-08-17 NOTE — Progress Notes (Signed)
Pt is being discharged home with home oxygen. Discharge instructions were given to patient and family

## 2014-08-17 NOTE — Care Management Note (Signed)
Case Management Note  Patient Details  Name: Jeremy Landry MRN: [id number] Date of Birth: [birth year]  Subjective/Objective:                    Action/Plan: Received a call from [place] with CIR stating that patient has chosen to discharge to SNF. Per Dr Wendee Beavers, patient is stable and ready for discharge today.  Patient has been on oxygen while in the hospital and may potentially require it at discharge.  CM spoke with bedside RN to notify that oxygen saturations need to be checked so that oxygen can be arranged for transportation to SNF, if needed.  CSW is aware of the potential need for oxygen at discharge.  Expected Discharge Date:                  Expected Discharge Plan:  Skilled Nursing Facility  In-House Referral:  Clinical Social Work  Discharge planning Services  CM Consult  Post Acute Care Choice:    Choice offered to:  Patient  DME Arranged:    DME Agency:     HH Arranged:    [place] Agency:     Status of Service:  Completed, signed off  Medicare Important Message Given:  Yes-second notification given Date Medicare IM Given:    Medicare IM give by:    Date Additional Medicare IM Given:    Additional Medicare Important Message give by:     If discussed at [place] of Stay Meetings, dates discussed:    Additional Comments:  Rolm Baptise, RN 08/17/2014, 10:47 AM

## 2014-08-17 NOTE — Progress Notes (Signed)
Rehab admissions - I met with pt this am to ask about his preference for CIR vs skilled nursing. He prefers to go to SNF due to copay costs associated with a possible CIR admit.  I explained that Marjorie Smolder, Education officer, museum will follow up with pt on planning for SNF. He prefers to go to [facility]. I updated Veronia Beets, case Freight forwarder.  I will now sign off pt's case. Thanks.  Nanetta Batty, PT Rehabilitation Admissions Coordinator [phone]

## 2014-08-17 NOTE — Clinical Social Work Note (Signed)
Clinical Social Work Assessment  Patient Details  Name: Jeremy Landry MRN: [id number] Date of Birth: 04/22/1945  Date of referral:  08/17/14               Reason for consult:  Discharge Planning, Facility Placement                Permission sought to share information with:  Case Manager, Customer service manager, Family Supports Permission granted to share information::  No   Housing/Transportation Living arrangements for the past 2 months:  [place] of Information:  Patient Patient Interpreter Needed:  None Criminal Activity/Legal Involvement Pertinent to Current Situation/Hospitalization:  No - Comment as needed Significant Relationships:  Parents Lives with:  Self Do you feel safe going back to the place where you live?  Yes Need for family participation in patient care:  No (Coment)  Care giving concerns:  Patient requiring continued therapy.    Social Worker assessment / plan:  Holiday representative met with patient at bedside in reference to post-acute placement for SNF. CSW introduced CSW role and SNF process. CSW further reviewed and provided SNF list. Patient reported that he was hopeful for CIR however cannot afford co-pays. Pt is now agreeable to SNF placement and prefers placement at [facility]. Pt reported his mother is currently at [facility]. Pt may require O2 at SNF. No further concerns reported at this time. Patient has bed at [facility] and CSW has contacted facility to start insurance auth. CSW will continue to follow pt and pt's family for continued support and to facilitate pt's discharge needs.  Employment status:  Retired Nurse, adult PT Recommendations:  Inpatient [place] / Referral to community resources:  [place]  Patient/Family's Response to care:  Pt alert and oriented. Pt agreeable to SNF at [facility]. Pt pleasant and appreciated social work intervention.    Patient/Family's Understanding of and Emotional Response to Diagnosis, Current Treatment, and Prognosis:  Pt understanding of medical work up and possibility of receiving home oxygen.    Emotional Assessment Appearance:  Appears stated age Attitude/Demeanor/Rapport:   (Pleasant ) Affect (typically observed):  Accepting, Appropriate, Pleasant Orientation:  Oriented to Self, Oriented to Place, Oriented to  Time, Oriented to Situation Alcohol / Substance use:  Not Applicable Psych involvement (Current and /or in the community):  No (Comment)  Discharge Needs  Concerns to be addressed:  Care Coordination Readmission within the last 30 days:  No Current discharge risk:  Dependent with Mobility Barriers to Discharge:  Barriers Resolved   Glendon Axe, MSW, LCSWA [phone] 08/17/2014 1:22 PM

## 2014-08-17 NOTE — Discharge Summary (Addendum)
Physician Discharge Summary  Jeremy Landry:[id number] DOB: 17-Jul-1945 DOA: 08/11/2014  PCP: Default, Provider, MD  Admit date: 08/11/2014 Discharge date: 08/17/2014  Time spent: > 35 minutes  Recommendations for Outpatient Follow-up:  1. Patient will need PFT's as outpatient 2. Was started on statin and plavix per neurology recommendations. Based on CT angio of abdomen:  There is an indeterminate lesion within the superior pole of the left kidney which may potentially represent an enhancing solid lesion. This needs dedicated evaluation with pre and post contrast-enhanced imaging, preferentially MRI.  Nonspecific enhancing lesion within the left hepatic lobe. Recommend definitive characterization with above recommended pre and post contrast-enhanced MRI. 3. Please review imaging study results and follow up on Radiology recommendations 4. Patient will go home with home health PT, RN and home oxygen 5. He is advised to establish with a primary care physician and have his low oxygenation worked up.  Discharge Diagnoses:  Principal Problem:   Numbness and tingling of both legs Active Problems:   Smoker   Essential hypertension   GERD (gastroesophageal reflux disease)   Generalized weakness   Lower extremity numbness  acute on chronic respiratory failure  Discharge Condition: stable  Diet recommendation: low sodium diet  Filed Weights   08/12/14 0220  Weight: 113.8 kg (250 lb 14.1 oz)    History of present illness:  Based on neurology's PN 69 YO male with bilateral LE weakness for the past week that is stated to have abruptly started. 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 Central enhancement after gadolinium administration with probable restricted diffusion. LP attempted at bedside was unsuccessful. IR was contacted to do a LP but due to MRI findings they did not feel there was an indication to do LP.  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 Today will be 4/5 doses solumedrol. ANA and NMO pending.  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 Transverse myelitis.  - Neurology consulted and following. They're waiting for ANA and and NMO antibody - patient received IV Solu-Medrol for 5 days currently day 5 of 5  - Plavix also started recently as patient has aspirin allergy  - started on statin given goal of < 70 currently 109. Last ast and alt 18 and 14  Active Problems: HTN  - stable placed on low salt diet low sodium - continue amlodipine at 5 mg po daily dose  GERD -Stable, continue PPI on d/c   Smoker -Encourage tobacco cessation.  Acute on chronic respiratory failure causing Hypoxia - Patient required oxygenation during his hospitalization. My attempting to wean oxygen, the patient's oxygen saturations dropped into the low 80s on room air. Given that patient has felt at his baseline, this is not an acute process and likely has been going on for some time. 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 I requested the patient's stay in the hospital for further workup, but he declined this. We are setting up home oxygen for him, 2 L nasal cannula continuous. I advised patient to please establish a primary care physician to have this further worked up and he says he will do so. He was not willing to stay in the hospital. Suggestion of a 1.3 cm subpleural nodule left lower lobe. Recommend follow-up chest CT in 6- 8 weeks to assess for interval change or resolution as the  pulmonary findings may potentially represent a component of atelectasis and or infectious/inflammatory process.  The ascending thoracic aorta measures 3.7 cm. Recommend annual imaging followup by CTA or MRA.  - initial x ray reported no edema or consolidation. Pt is afebrile and  WBC within normal limits on last check, no symptoms consistent with pna. - Pt will need PFT's and f/u CT in 6-8 wks as listed above.     Procedures:  None  Consultations:  Neurology  PM&R  Discharge Exam: Filed Vitals:   08/17/14 0549  BP: 152/92  Pulse: 54  Temp: 98.2 F (36.8 C)  Resp: 20    General: Pt in nad, alert and awake Cardiovascular: rrr, no mrg Respiratory: cta bl, no wheezes, equal chest rise  Discharge Instructions   Discharge Instructions    Call MD for:  difficulty breathing, headache or visual disturbances    Complete by:  As directed      Call MD for:  temperature >100.4    Complete by:  As directed      Diet - low sodium heart healthy    Complete by:  As directed      Increase activity slowly    Complete by:  As directed           Current Discharge Medication List    START taking these medications   Details  clopidogrel (PLAVIX) 75 MG tablet Take 1 tablet (75 mg total) by mouth daily. Qty: 30 tablet, Refills: 0    simvastatin (ZOCOR) 20 MG tablet Take 1 tablet (20 mg total) by mouth daily at 6 PM. Qty: 30 tablet, Refills: 0      CONTINUE these medications which have NOT CHANGED   Details  finasteride (PROSCAR) 5 MG tablet Take 5 mg by mouth daily.    loratadine (CLARITIN) 10 MG tablet Take 10 mg by mouth daily.    omeprazole (PRILOSEC) 20 MG capsule Take 20 mg by mouth daily.    terazosin (HYTRIN) 5 MG capsule Take 5 mg by mouth at bedtime.    sertraline (ZOLOFT) 100 MG tablet Take 100 mg by mouth daily.      STOP taking these medications     amLODipine (NORVASC) 10 MG tablet      naproxen (NAPROSYN) 500 MG tablet        Allergies  Allergen Reactions  . Aspirin Anaphylaxis  . Shellfish Allergy Anaphylaxis      The results of significant diagnostics from this hospitalization (including imaging, microbiology, ancillary and laboratory) are listed below for reference.    Significant Diagnostic Studies: Dg Chest 2  View  08/11/2014   CLINICAL DATA:  Chronic shortness of breath. Chronic left upper extremity region pain. Hypertension.  EXAM: CHEST  2 VIEW  COMPARISON:  May 25, 2010  FINDINGS: There is slight scarring in the right and left lower lobe regions. There is no edema or consolidation. Heart size and pulmonary vascularity are normal. No adenopathy. There is postoperative change in the lower cervical region.  IMPRESSION: Areas of mild scarring in the lower lobes bilaterally. No edema or consolidation.   Electronically Signed   By: Lowella Grip III M.D.   On: 08/11/2014 14:42   Ct Head Wo Contrast  08/12/2014   CLINICAL DATA:  Numb waistline for 5 days. History of hypertension and prostate cancer. Current smoker.  EXAM: CT HEAD WITHOUT CONTRAST  TECHNIQUE: Contiguous axial images were obtained from the base of the skull through the vertex without intravenous contrast.  COMPARISON:  MRI of the head May 24, 2010  FINDINGS: The ventricles and sulci are normal for age. No intraparenchymal hemorrhage, mass effect nor midline shift. Patchy supratentorial white matter hypodensities are within normal range for patient's age and though non-specific suggest sequelae of chronic small vessel ischemic disease. No acute large vascular territory infarcts.  No abnormal extra-axial fluid collections. Basal cisterns are patent. Mild calcific atherosclerosis of the carotid siphons.  No skull fracture. The included ocular globes and orbital contents are non-suspicious. Moderate paranasal sinus mucosal thickening without air-fluid levels. The mastoid air cells are well aerated.  IMPRESSION: No acute intracranial process.  Involutional changes. Moderate white matter changes compatible with chronic small vessel ischemic disease.   Electronically Signed   By: Elon Alas M.D.   On: 08/12/2014 01:24   Ct Angio Abdomen W/cm &/or Wo Contrast  08/12/2014   CLINICAL DATA:  Patient with bilateral lower extremity pain and weakness.  Evaluate for aortic aneurysm or dissection.  EXAM: CT ANGIOGRAPHY CHEST AND ABDOMEN  TECHNIQUE: Multidetector CT imaging of the chest and abdomen was performed using the standard protocol during bolus administration of intravenous contrast. Multiplanar CT image reconstructions and MIPs were obtained to evaluate the vascular anatomy.  CONTRAST:  132m OMNIPAQUE IOHEXOL 350 MG/ML SOLN  COMPARISON:  None.  FINDINGS: CTA CHEST FINDINGS  Mediastinum/Nodes: Noncontrast images through the chest demonstrate no high attenuation within the aorta to suggest intramural hemorrhage. Postcontrast images demonstrate no evidence for thoracic aortic dissection. Evaluation of the aortic root is limited due to motion artifact. The ascending thoracic aorta measures 3.7 cm. The take off of the great vessels are patent. No axillary, mediastinal or hilar lymphadenopathy.  Lungs/Pleura: Central airways are patent. Dependent consolidative opacities within the bilateral lower lobes. Additionally there is bilateral subpleural architectural distortion and reticulation with associated ground-glass. No pleural effusion or pneumothorax. There is suggestion of a 1.3 cm subpleural left lower lobe nodule (image 71; series 5).  Review of the MIP images confirms the above findings.  CTA ABDOMEN FINDINGS  Hepatobiliary: Within the left hepatic lobe there is a 2.3 cm peripheral enhancing lesion (image 131; series 5). Liver is normal in size and contour. Gallbladder is unremarkable. No intrahepatic or extrahepatic biliary ductal dilatation.  Pancreas: Unremarkable  Spleen: Unremarkable  Adrenals/Urinary Tract: Adrenal glands are normal. Within the superior pole of the left kidney there is a 2.8 cm lesion within internal density of 44 Hounsfield units.  Stomach/Bowel: No abnormal bowel wall thickening or evidence for bowel obstruction. No free fluid or free intraperitoneal air.  Vascular/Lymphatic: The celiac axis and superior mesenteric arteries are  patent. Single bilateral renal arteries are patent. Peripheral calcified atherosclerotic plaque of the abdominal aorta which is normal in caliber. Inferior mesenteric artery is patent. No retroperitoneal adenopathy.  Other: None  Musculoskeletal: Extensive lower lumbar spine degenerative changes. No aggressive or acute appearing osseous lesions.  Review of the MIP images confirms the above findings.  IMPRESSION: No CT evidence for thoracic or abdominal aortic dissection. The aortic root is limited in evaluation due to motion artifact.  There is an indeterminate lesion within the superior pole of the left kidney which may potentially represent an enhancing solid lesion. This needs dedicated evaluation with pre and post contrast-enhanced imaging, preferentially MRI.  Nonspecific enhancing lesion within the left hepatic lobe. Recommend definitive characterization with above recommended pre and post contrast-enhanced MRI.  Suggestion of a 1.3 cm subpleural nodule left lower lobe. Recommend follow-up chest CT in 6- 8 weeks to assess  for interval change or resolution as the pulmonary findings may potentially represent a component of atelectasis and or infectious/inflammatory process.  The ascending thoracic aorta measures 3.7 cm. Recommend annual imaging followup by CTA or MRA. This recommendation follows 2010 ACCF/AHA/AATS/ACR/ASA/SCA/SCAI/SIR/STS/SVM Guidelines for the Diagnosis and Management of Patients with Thoracic Aortic Disease. Circulation.2010; 121: G387-F643  These results will be called to the ordering clinician or representative by the Radiologist Assistant, and communication documented in the PACS or zVision Dashboard.   Electronically Signed   By: Lovey Newcomer M.D.   On: 08/12/2014 22:15   Mr Cervical Spine Wo Contrast  08/12/2014   CLINICAL DATA:  Numbness and bilateral lower extremity weakness for a little more than 1 week. History of prostate cancer.  EXAM: MRI CERVICAL AND THORACIC SPINE WITHOUT  CONTRAST  TECHNIQUE: Multiplanar and multiecho pulse sequences of the cervical and thoracic spine were obtained without intravenous contrast.  COMPARISON:  None.  FINDINGS: MR CERVICAL SPINE FINDINGS  The study is degraded by patient motion. The patient is status post C4-7 anterior discectomy and fusion. There is straightening of the normal cervical lordosis but vertebral body height and alignment are maintained. The craniocervical junction is normal. Cervical cord signal is unremarkable. The patient has a congenitally narrow central spinal canal. Imaged paraspinous structures are unremarkable.  C2-3: Shallow disc bulge without central canal or foraminal narrowing.  C3-4: Disc osteophyte complex narrows the ventral thecal sac but the central canal and foramina appear open.  C4-5: Status post discectomy and fusion. The central canal is open. Uncovertebral spurring causes mild to moderate foraminal narrowing, more notable on the right.  C5-6: Status post discectomy and fusion. The central canal is patent. Uncovertebral spurring causes moderate to moderately severe foraminal narrowing, worse on the right.  C6-7: Status post discectomy and fusion. Endplate spurring and uncovertebral disease are seen. The ventral cord is mildly deformed. Moderately severe to severe bilateral foraminal narrowing is identified.  C7-T1: Shallow disc bulge endplate spur. The central canal is mildly narrowed. Moderate to moderately severe bilateral foraminal narrowing appears worse on the right.  MR THORACIC SPINE FINDINGS  Vertebral body height, signal and alignment are maintained. There is signal abnormality centrally within the cord from approximately the T3-4 level through the superior aspect of T5. The cord appears anteriorly positioned against the T4 vertebral body which is likely due to normal kyphosis. The thoracic cord is otherwise unremarkable. The central canal and neural foramina appear patent at all levels. Small central  protrusion at T8-9 and shallow right paracentral protrusion T9-10 are noted.  IMPRESSION: Signal abnormality within the thoracic cord from the T3-4 through upper T5 level is nonspecific. Main differential considerations are cord infarct versus transverse myelitis. The cord appears anteriorly positioned this level which is likely due to kyphosis rather than a ventral dural defect. Post-contrast and diffusion imaging of the thoracic cord are recommended.  Status post C4-7 fusion. Uncovertebral and endplate spurring cause foraminal narrowing at the fusion levels. The cord is mildly deformed at the C6-7 level as described above.  Critical Value/emergent results were called by telephone at the time of interpretation on 08/12/2014 at 9:13 am to Dr. Alexis Goodell , who verbally acknowledged these results.   Electronically Signed   By: Inge Rise M.D.   On: 08/12/2014 09:16   Mr Thoracic Spine Wo Contrast  08/12/2014   CLINICAL DATA:  Numbness and bilateral lower extremity weakness for a little more than 1 week. History of prostate cancer.  EXAM: MRI CERVICAL  AND THORACIC SPINE WITHOUT CONTRAST  TECHNIQUE: Multiplanar and multiecho pulse sequences of the cervical and thoracic spine were obtained without intravenous contrast.  COMPARISON:  None.  FINDINGS: MR CERVICAL SPINE FINDINGS  The study is degraded by patient motion. The patient is status post C4-7 anterior discectomy and fusion. There is straightening of the normal cervical lordosis but vertebral body height and alignment are maintained. The craniocervical junction is normal. Cervical cord signal is unremarkable. The patient has a congenitally narrow central spinal canal. Imaged paraspinous structures are unremarkable.  C2-3: Shallow disc bulge without central canal or foraminal narrowing.  C3-4: Disc osteophyte complex narrows the ventral thecal sac but the central canal and foramina appear open.  C4-5: Status post discectomy and fusion. The central canal is  open. Uncovertebral spurring causes mild to moderate foraminal narrowing, more notable on the right.  C5-6: Status post discectomy and fusion. The central canal is patent. Uncovertebral spurring causes moderate to moderately severe foraminal narrowing, worse on the right.  C6-7: Status post discectomy and fusion. Endplate spurring and uncovertebral disease are seen. The ventral cord is mildly deformed. Moderately severe to severe bilateral foraminal narrowing is identified.  C7-T1: Shallow disc bulge endplate spur. The central canal is mildly narrowed. Moderate to moderately severe bilateral foraminal narrowing appears worse on the right.  MR THORACIC SPINE FINDINGS  Vertebral body height, signal and alignment are maintained. There is signal abnormality centrally within the cord from approximately the T3-4 level through the superior aspect of T5. The cord appears anteriorly positioned against the T4 vertebral body which is likely due to normal kyphosis. The thoracic cord is otherwise unremarkable. The central canal and neural foramina appear patent at all levels. Small central protrusion at T8-9 and shallow right paracentral protrusion T9-10 are noted.  IMPRESSION: Signal abnormality within the thoracic cord from the T3-4 through upper T5 level is nonspecific. Main differential considerations are cord infarct versus transverse myelitis. The cord appears anteriorly positioned this level which is likely due to kyphosis rather than a ventral dural defect. Post-contrast and diffusion imaging of the thoracic cord are recommended.  Status post C4-7 fusion. Uncovertebral and endplate spurring cause foraminal narrowing at the fusion levels. The cord is mildly deformed at the C6-7 level as described above.  Critical Value/emergent results were called by telephone at the time of interpretation on 08/12/2014 at 9:13 am to Dr. Alexis Goodell , who verbally acknowledged these results.   Electronically Signed   By: Inge Rise M.D.   On: 08/12/2014 09:16   Mr Thoracic Spine W Contrast  08/12/2014   CLINICAL DATA:  Patient with history of prostate cancer. Acute onset of pain radiating from rectum to penis. On waking the next day, patient had numbness from waist down.  EXAM: MRI THORACIC SPINE WITH CONTRAST  TECHNIQUE: Multiplanar and multiecho pulse sequences of the thoracic spine were obtained with intravenous contrast.  CONTRAST:  60m MULTIHANCE GADOBENATE DIMEGLUMINE 529 MG/ML IV SOLN  COMPARISON:  08/12/2014 cervical and thoracic MRI.  FINDINGS: This study compliments the previous cervical and thoracic MRI. Counting was performed from the craniocervical junction. C4 through C7 ACDF. Bone marrow signal shows heterogenous marrow. This is a nonspecific finding most commonly associated with obesity, anemia, cigarette smoking or chronic disease. No enhancing aggressive osseous lesions are present. There is enhancement of the T1 superior endplate which is compatible with adjacent segment disease from the relatively long segment cervical fusion above. No findings to suggest bony metastatic disease.  Thoracic cord is  swollen with increased intramedullary signal extending from T3-T4 through the mid T5 vertebra. This area demonstrates post gadolinium enhancement. No intramedullary hematoma. There is increased diffusion signal on diffusion-weighted imaging which could be associated with either infarct or transverse myelitis. No evidence of AVM. On the axial images, the cord enhancement is central in the lesion. No thoracic spinal stenosis.  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 Central enhancement after gadolinium administration with probable restricted diffusion. The differential considerations are unchanged and this pre and post gadolinium study does not establish a conclusive diagnosis. Most likely differential considerations are cord infarct versus transverse myelitis. Given  these differential considerations, the clinical presentation would probably be more helpful in narrowing the differential diagnosis.   Electronically Signed   By: Dereck Ligas M.D.   On: 08/12/2014 12:48   Mr Lumbar Spine W Wo Contrast  08/11/2014   CLINICAL DATA:  Numbness and tingling both legs. Prior back surgery.  EXAM: MRI LUMBAR SPINE WITHOUT AND WITH CONTRAST  TECHNIQUE: Multiplanar and multiecho pulse sequences of the lumbar spine were obtained without and with intravenous contrast.  CONTRAST:  10m MULTIHANCE GADOBENATE DIMEGLUMINE 529 MG/ML IV SOLN  COMPARISON:  Lumbar radiographs 12/14/2007, CT myelogram 04/11/2004, lumbar MRI 03/24/2004  FINDINGS: Based on prior radiographs, 6 non-rib-bearing lumbar segments are present. The lowest of these will be labeled S1, consistent with prior MRI and myelogram. Lowest disc space is S1-S2.  Straightening of the lumbar lordosis. Negative for fracture or mass. Multilevel degenerative change in the lumbar spine with extensive bone marrow signal abnormalities compatible with disc degeneration. Conus medullaris is normal and terminates at L1-2.  L1-2:  Mild disc and mild facet degeneration without spinal stenosis  L2-3: Diffuse disc bulging and spondylosis. Prominent right lateral osteophyte and disc complex similar to 2006. Bilateral facet degeneration. Small right-sided disc protrusion unchanged from the prior MRI. Mild spinal stenosis.  L3-4: Disc degeneration and spondylosis with diffuse endplate osteophyte formation. Bilateral facet hypertrophy. Improvement in right-sided disc protrusion since the prior studies. Moderate spinal stenosis. Moderate facet hypertrophy bilaterally. Foraminal encroachment is present bilaterally  L4-5: Diffuse disc bulging and spondylosis. Moderate spinal stenosis. Subarticular stenosis bilaterally. No change from the prior studies  L5-S1: Prior laminotomy on the right. Disc degeneration and spurring right greater than left with  subarticular and foraminal stenosis on the right. Moderate spinal stenosis unchanged from the prior study.  L5-S1: Disc and facet degeneration. No significant disc protrusion or stenosis.  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  The lowest disc space is labeled L5-S1.  L2-3 disc and facet degeneration and mild spinal stenosis unchanged. Small right-sided disc protrusion L2-3 unchanged.  L3-4 disc and facet degeneration with moderate spinal stenosis. Interval improvement in right-sided disc protrusion since 2006.  Moderate spinal stenosis L4-5 due to spondylosis and facet hypertrophy.  Moderate spinal stenosis L5-S1 unchanged from the prior study.   Electronically Signed   By: CFranchot GalloM.D.   On: 08/11/2014 18:54   Ct Angio Chest Aorta W/cm &/or Wo/cm  08/12/2014   CLINICAL DATA:  Patient with bilateral lower extremity pain and weakness. Evaluate for aortic aneurysm or dissection.  EXAM: CT ANGIOGRAPHY CHEST AND ABDOMEN  TECHNIQUE: Multidetector CT imaging of the chest and abdomen was performed using the standard protocol during bolus administration of intravenous contrast. Multiplanar CT image reconstructions and MIPs were obtained to evaluate the vascular anatomy.  CONTRAST:  1075mOMNIPAQUE IOHEXOL 350 MG/ML SOLN  COMPARISON:  None.  FINDINGS: CTA CHEST FINDINGS  Mediastinum/Nodes: Noncontrast images through the chest demonstrate no high attenuation within the aorta to suggest intramural hemorrhage. Postcontrast images demonstrate no evidence for thoracic aortic dissection. Evaluation of the aortic root is limited due to motion artifact. The ascending thoracic aorta measures 3.7 cm. The take off of the great vessels are patent. No axillary, mediastinal or hilar lymphadenopathy.  Lungs/Pleura: Central airways are patent. Dependent consolidative opacities within the bilateral lower lobes. Additionally there is bilateral subpleural architectural distortion and reticulation with associated ground-glass. No pleural  effusion or pneumothorax. There is suggestion of a 1.3 cm subpleural left lower lobe nodule (image 71; series 5).  Review of the MIP images confirms the above findings.  CTA ABDOMEN FINDINGS  Hepatobiliary: Within the left hepatic lobe there is a 2.3 cm peripheral enhancing lesion (image 131; series 5). Liver is normal in size and contour. Gallbladder is unremarkable. No intrahepatic or extrahepatic biliary ductal dilatation.  Pancreas: Unremarkable  Spleen: Unremarkable  Adrenals/Urinary Tract: Adrenal glands are normal. Within the superior pole of the left kidney there is a 2.8 cm lesion within internal density of 44 Hounsfield units.  Stomach/Bowel: No abnormal bowel wall thickening or evidence for bowel obstruction. No free fluid or free intraperitoneal air.  Vascular/Lymphatic: The celiac axis and superior mesenteric arteries are patent. Single bilateral renal arteries are patent. Peripheral calcified atherosclerotic plaque of the abdominal aorta which is normal in caliber. Inferior mesenteric artery is patent. No retroperitoneal adenopathy.  Other: None  Musculoskeletal: Extensive lower lumbar spine degenerative changes. No aggressive or acute appearing osseous lesions.  Review of the MIP images confirms the above findings.  IMPRESSION: No CT evidence for thoracic or abdominal aortic dissection. The aortic root is limited in evaluation due to motion artifact.  There is an indeterminate lesion within the superior pole of the left kidney which may potentially represent an enhancing solid lesion. This needs dedicated evaluation with pre and post contrast-enhanced imaging, preferentially MRI.  Nonspecific enhancing lesion within the left hepatic lobe. Recommend definitive characterization with above recommended pre and post contrast-enhanced MRI.  Suggestion of a 1.3 cm subpleural nodule left lower lobe. Recommend follow-up chest CT in 6- 8 weeks to assess for interval change or resolution as the pulmonary  findings may potentially represent a component of atelectasis and or infectious/inflammatory process.  The ascending thoracic aorta measures 3.7 cm. Recommend annual imaging followup by CTA or MRA. This recommendation follows 2010 ACCF/AHA/AATS/ACR/ASA/SCA/SCAI/SIR/STS/SVM Guidelines for the Diagnosis and Management of Patients with Thoracic Aortic Disease. Circulation.2010; 121: T419-Q222  These results will be called to the ordering clinician or representative by the Radiologist Assistant, and communication documented in the PACS or zVision Dashboard.   Electronically Signed   By: Lovey Newcomer M.D.   On: 08/12/2014 22:15    Microbiology: No results found for this or any previous visit (from the past 240 hour(s)).   Labs: Basic Metabolic Panel:  Recent Labs Lab 08/11/14 1400 08/12/14 0029 08/12/14 1515 08/14/14 0635  NA 139 141 138 136  K 3.4* 3.6 3.3* 4.5  CL 106 106 102 102  CO2 '26 27 29 25  '$ GLUCOSE 106* 103* 131* 152*  BUN '13 12 10 18  '$ CREATININE 1.04 0.87 0.92 0.85  CALCIUM 9.4 9.6 9.3 9.6   Liver Function Tests:  Recent Labs Lab 08/12/14 0029 08/12/14 1515  AST 17 18  ALT 15* 14*  ALKPHOS 53 52  BILITOT 0.8 0.6  PROT 7.3 7.0  ALBUMIN 3.8 3.6   No results for input(s): LIPASE, AMYLASE  in the last 168 hours. No results for input(s): AMMONIA in the last 168 hours. CBC:  Recent Labs Lab 08/11/14 1400 08/12/14 0029  WBC 5.3 6.0  NEUTROABS 3.2  --   HGB 11.9* 12.2*  HCT 36.1* 35.9*  MCV 93.3 92.1  PLT 240 237   Cardiac Enzymes: No results for input(s): CKTOTAL, CKMB, CKMBINDEX, TROPONINI in the last 168 hours. BNP: BNP (last 3 results) No results for input(s): BNP in the last 8760 hours.  ProBNP (last 3 results) No results for input(s): PROBNP in the last 8760 hours.  CBG:  Recent Labs Lab 08/13/14 0608 08/14/14 0528 08/15/14 0532 08/16/14 0632  GLUCAP 144* 148* 119* 151*       Signed:  Velvet Bathe  [facility] 08/17/2014, 10:20  AM

## 2014-08-17 NOTE — Care Management Note (Signed)
Case Management Note  Patient Details  Name: Jeremy Landry MRN: [id number] Date of Birth: 11-Jul-1945  Subjective/Objective:                    Action/Plan: Met with patient to discuss discharge planning. Patient has not yet received insurance approval for his SNF of choice and is declining to be transferred to an alternate facility.  Patient is choosing to discharge home.  Patient is requiring supplemental oxygen, with saturations in the 80s with ambulation.  CM spoke with Dr Lyman Speller regarding the discharge.  Dr Maryland Pink spoke at length with patient.  Orders will be placed for home oxygen and chart notation will be updated to reflect the need.  Patient is agreeable to home oxygen and home health services through [facility].  Awaiting orders to send referral.  Patient states that he does not have a PCP.  CM provided patient with the [facility] number and explained the importance of a PCP to patient.  CM strongly urged patient to call the number first thing in the morning to establish care.  Patient, Dr Maryland Pink, and bedside RN are aware of discharge plan.  [facility] aware of need for oxygen prior to discharge home tonight.  Expected Discharge Date:                  Expected Discharge Plan:  [place]  In-House Referral:  Clinical Social Work, PCP / Psychologist, educational  Discharge planning Services  CM Consult  Post Acute Care Choice:    Choice offered to:  Patient  DME Arranged:  Oxygen DME Agency:  [place]:  RN, PT [facility] Agency:  [place]  Status of Service:  Completed, signed off  Medicare Important Message Given:  Yes-second notification given Date Medicare IM Given:    Medicare IM give by:    Date Additional Medicare IM Given:    Additional Medicare Important Message give by:     If discussed at [place] of Stay Meetings, dates discussed:    Additional CommentsRolm Baptise, RN 08/17/2014, 5:17  PM [phone]

## 2014-08-17 NOTE — Progress Notes (Signed)
Physical Therapy Treatment Patient Details Name: Jeremy Landry MRN: [id number] DOB: Oct 15, 1945 Today's Date: 08/17/2014    History of Present Illness 69 YO male with bilateral LE weakness for the past week that is stated to have abruptly started. 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     PT Comments    Pt much improved this date and was able to ambulate without AD with min guard with no signs of ataxia. Pt with noted back pain (chronic) limited ambulation tolerance. Pt now functioning at supervision level and reports his brother will be at home with him. Pt safe to return home with assist of his brother. Pt with noted decreased SpO2 to 81% on RA during ambulation. Pt safe to return home with brother once medically stable.   Follow Up Recommendations  Home health PT;Supervision/Assistance - 24 hour     Equipment Recommendations  None recommended by PT    Recommendations for Other Services       Precautions / Restrictions Precautions Precautions: Fall Restrictions Weight Bearing Restrictions: No   SATURATION QUALIFICATIONS: (This note is used to comply with regulatory documentation for home oxygen)  Patient Saturations on Room Air at Rest = 85%  Patient Saturations on Room Air while Ambulating = 81%  Patient Saturations on 3 Liters of oxygen while Ambulating = 88%  Please briefly explain why patient needs home oxygen: unable to achieve above 92% on RA   Mobility  Bed Mobility Overal bed mobility: Needs Assistance Bed Mobility: Supine to Sit Rolling: Supervision         General bed mobility comments: pt with good technique  Transfers Overall transfer level: Needs assistance Equipment used: None Transfers: Sit to/from Stand Sit to Stand: Supervision         General transfer comment: pt pushed up with UEs, demo'd stability  Ambulation/Gait Ambulation/Gait assistance: Min  guard Ambulation Distance (Feet): 500 Feet Assistive device: None Gait Pattern/deviations: Step-through pattern;Trunk flexed;Antalgic Gait velocity: decreased Gait velocity interpretation: at or above normal speed for age/gender General Gait Details: Pt ambulated 500 feet with no AD min guard A, vc for standing up straight and 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.DGI was attmepted    Financial trader Rankin (Stroke Patients Only) Modified Rankin (Stroke Patients Only) Pre-Morbid Rankin Score: Slight disability Modified Rankin: Moderate disability     Balance           Standing balance support: No upper extremity supported Standing balance-Leahy Scale: Good Standing balance comment: pt able to get self a cup of water without diffculty or LOB                    Cognition Arousal/Alertness: Awake/alert Behavior During Therapy: WFL for tasks assessed/performed Overall Cognitive Status: Within Functional Limits for tasks assessed                      Exercises General Exercises - Lower Extremity Mini-Sqauts: AROM;10 reps;Standing    General Comments        Pertinent Vitals/Pain Pain Assessment: 0-10 Pain Score: 10-Worst pain ever Pain Location: back pain Pain Descriptors / Indicators: Discomfort Pain Intervention(s): Limited activity within patient's tolerance    Home Living                      Prior  Function            PT Goals (current goals can now be found in the care plan section) Acute Rehab PT Goals Patient Stated Goal: home Progress towards PT goals: Progressing toward goals    Frequency  Min 4X/week    PT Plan Discharge plan needs to be updated;Frequency needs to be updated    Co-evaluation             End of Session Equipment Utilized During Treatment: Gait belt Activity Tolerance: Patient tolerated treatment well Patient left: in chair;with  call bell/phone within reach;with chair alarm set;with family/visitor present     Time: 2820-8138 PT Time Calculation (min) (ACUTE ONLY): 36 min  Charges:  $Gait Training: 8-22 mins $Neuromuscular Re-education: 8-22 mins                    G Codes:      Kingsley Callander 08/17/2014, 4:54 PM   Kittie Plater, PT, DPT Pager #: [phone] Office #: [phone]

## 2014-08-17 NOTE — Clinical Social Work Placement (Signed)
   CLINICAL SOCIAL WORK PLACEMENT  NOTE  Date:  08/17/2014  Patient Details  Name: Jeremy Landry MRN: [id number] Date of Birth: Nov 25, 1945  Clinical Social Work is seeking post-discharge placement for this patient at the [place] level of care (*CSW will initial, date and re-position this form in  chart as items are completed):  Yes   Patient/family provided with [place] Work Department's list of facilities offering this level of care within the geographic area requested by the patient (or if unable, by the patient's family).  Yes   Patient/family informed of their freedom to choose among providers that offer the needed level of care, that participate in Medicare, Medicaid or managed care program needed by the patient, have an available bed and are willing to accept the patient.  Yes   Patient/family informed of [facility]'s ownership interest in [facility] and [facility], as well as of the fact that they are under no obligation to receive care at these facilities.  PASRR submitted to EDS on 08/17/14     PASRR number received on 08/17/14     Existing PASRR number confirmed on       FL2 transmitted to all facilities in geographic area requested by pt/family on 08/17/14     FL2 transmitted to all facilities within larger geographic area on       Patient informed that his/her managed care company has contracts with or will negotiate with certain facilities, including the following:        Yes   Patient/family informed of bed offers received.  Patient chooses bed at  [facility] )     Physician recommends and patient chooses bed at      Patient to be transferred to  [facility] ) on 08/17/14.  Patient to be transferred to facility by  [facility] )     Patient family notified on 08/17/14 of transfer.  Name of family member notified:   (Pt reported he will notify family)     PHYSICIAN Please prepare  prescriptions     Additional Comment:    _______________________________________________ Glendon Axe, MSW, LCSWA [phone] 08/17/2014 1:24 PM

## 2014-08-17 NOTE — Progress Notes (Signed)
SATURATION QUALIFICATIONS: (This note is used to comply with regulatory documentation for home oxygen)  Patient Saturations on Room Air at Rest = 91%  Patient Saturations on Room Air while Ambulating = 85 %  Patient Saturations on 2 Liters of oxygen while Ambulating = 97 %  Please briefly explain why patient needs home oxygen: See Result above

## 2014-08-23 LAB — NEUROMYELITIS OPTICA AUTOAB, IGG: NMO-IgG: NEGATIVE

## 2014-10-21 ENCOUNTER — Emergency Department (HOSPITAL_COMMUNITY)
Admission: EM | Admit: 2014-10-21 | Discharge: 2014-10-21 | Disposition: A | Discharge status: Home / Self Care | Payer: Medicare HMO | Attending: Emergency Medicine | Admitting: Emergency Medicine

## 2014-10-21 ENCOUNTER — Encounter (HOSPITAL_COMMUNITY): Payer: Self-pay | Admitting: *Deleted

## 2014-10-21 ENCOUNTER — Emergency Department (HOSPITAL_COMMUNITY): Payer: Medicare HMO

## 2014-10-21 DIAGNOSIS — R0602 Shortness of breath: Secondary | ICD-10-CM | POA: Insufficient documentation

## 2014-10-21 DIAGNOSIS — R42 Dizziness and giddiness: Secondary | ICD-10-CM | POA: Insufficient documentation

## 2014-10-21 DIAGNOSIS — K219 Gastro-esophageal reflux disease without esophagitis: Secondary | ICD-10-CM | POA: Insufficient documentation

## 2014-10-21 DIAGNOSIS — Z8546 Personal history of malignant neoplasm of prostate: Secondary | ICD-10-CM | POA: Diagnosis not present

## 2014-10-21 DIAGNOSIS — Z7902 Long term (current) use of antithrombotics/antiplatelets: Secondary | ICD-10-CM | POA: Diagnosis not present

## 2014-10-21 DIAGNOSIS — Z79899 Other long term (current) drug therapy: Secondary | ICD-10-CM | POA: Insufficient documentation

## 2014-10-21 DIAGNOSIS — I10 Essential (primary) hypertension: Secondary | ICD-10-CM | POA: Diagnosis not present

## 2014-10-21 DIAGNOSIS — Z87891 Personal history of nicotine dependence: Secondary | ICD-10-CM | POA: Diagnosis not present

## 2014-10-21 LAB — I-STAT ARTERIAL BLOOD GAS, ED
Acid-Base Excess: 1 mmol/L (ref 0.0–2.0)
BICARBONATE: 25.4 meq/L — AB (ref 20.0–24.0)
O2 Saturation: 93 %
TCO2: 27 mmol/L (ref 0–100)
pCO2 arterial: 39.9 mmHg (ref 35.0–45.0)
pH, Arterial: 7.41 (ref 7.350–7.450)
pO2, Arterial: 65 mmHg — ABNORMAL LOW (ref 80.0–100.0)

## 2014-10-21 LAB — I-STAT TROPONIN, ED: TROPONIN I, POC: 0 ng/mL (ref 0.00–0.08)

## 2014-10-21 LAB — CBC WITH DIFFERENTIAL/PLATELET
Basophils Absolute: 0 10*3/uL (ref 0.0–0.1)
Basophils Relative: 1 %
EOS PCT: 7 %
Eosinophils Absolute: 0.3 10*3/uL (ref 0.0–0.7)
HCT: 35.5 % — ABNORMAL LOW (ref 39.0–52.0)
Hemoglobin: 11.5 g/dL — ABNORMAL LOW (ref 13.0–17.0)
LYMPHS ABS: 1.4 10*3/uL (ref 0.7–4.0)
LYMPHS PCT: 33 %
MCH: 29.6 pg (ref 26.0–34.0)
MCHC: 32.4 g/dL (ref 30.0–36.0)
MCV: 91.5 fL (ref 78.0–100.0)
MONO ABS: 0.3 10*3/uL (ref 0.1–1.0)
Monocytes Relative: 7 %
Neutro Abs: 2.2 10*3/uL (ref 1.7–7.7)
Neutrophils Relative %: 52 %
PLATELETS: 259 10*3/uL (ref 150–400)
RBC: 3.88 MIL/uL — ABNORMAL LOW (ref 4.22–5.81)
RDW: 12.8 % (ref 11.5–15.5)
WBC: 4.2 10*3/uL (ref 4.0–10.5)

## 2014-10-21 LAB — BASIC METABOLIC PANEL
Anion gap: 6 (ref 5–15)
BUN: 15 mg/dL (ref 6–20)
CHLORIDE: 107 mmol/L (ref 101–111)
CO2: 27 mmol/L (ref 22–32)
Calcium: 9.6 mg/dL (ref 8.9–10.3)
Creatinine, Ser: 0.88 mg/dL (ref 0.61–1.24)
GFR calc Af Amer: >60 mL/min (ref >60)
GLUCOSE: 112 mg/dL — AB (ref 65–99)
Potassium: 4.1 mmol/L (ref 3.5–5.1)
Sodium: 140 mmol/L (ref 135–145)

## 2014-10-21 LAB — URINALYSIS, ROUTINE W REFLEX MICROSCOPIC
BILIRUBIN URINE: NEGATIVE
Glucose, UA: NEGATIVE mg/dL
HGB URINE DIPSTICK: NEGATIVE
KETONES UR: NEGATIVE mg/dL
Nitrite: NEGATIVE
PROTEIN: NEGATIVE mg/dL
Specific Gravity, Urine: 1.026 (ref 1.005–1.030)
UROBILINOGEN UA: 1 mg/dL (ref 0.0–1.0)
pH: 6 (ref 5.0–8.0)

## 2014-10-21 LAB — URINE MICROSCOPIC-ADD ON

## 2014-10-21 LAB — BRAIN NATRIURETIC PEPTIDE: B NATRIURETIC PEPTIDE 5: 9.8 pg/mL (ref 0.0–100.0)

## 2014-10-21 NOTE — Discharge Instructions (Signed)

## 2014-10-21 NOTE — ED Notes (Signed)
Pt updated on reason for delay for CT scan

## 2014-10-21 NOTE — ED Notes (Signed)
Pt in from home c/o dizziness onset yesterday, pt seen at PCP office & told to come here, pt presents today denies dizziness, currently not taking HTN meds per pt report, pt states, "[facility] told me to stop taking them because I am dizzy." pt denies CP, n/v/d, pt reports increased SOB, pt uses 2 L [place] at home, A&O x4, speaks in complete sentences

## 2014-10-21 NOTE — ED Provider Notes (Signed)
CSN: [id number]     Arrival date & time 10/21/14  8413 History   First MD Initiated Contact with Patient 10/21/14 [phone]     Chief Complaint  Patient presents with  . Dizziness     (Consider location/radiation/quality/duration/timing/severity/associated sxs/prior Treatment) Patient is a 69 y.o. male presenting with dizziness. The history is provided by the patient.  Dizziness Quality:  Imbalance Severity:  Moderate Onset quality:  Gradual Duration:  3 months Timing:  Intermittent Progression:  Waxing and waning Chronicity:  Recurrent Context comment:  Occurs at random Relieved by: stopping BP meds. Worsened by:  Nothing Associated symptoms: shortness of breath (chronically)   Associated symptoms: no headaches, no nausea, no vision changes, no vomiting and no weakness   Shortness of breath:    Severity:  Mild   Onset quality:  Gradual   Timing:  Constant   Progression:  Unchanged (chronic) Risk factors: no anemia     Past Medical History  Diagnosis Date  . Hypertension   . GERD (gastroesophageal reflux disease)   . Prostate cancer [facility])    Past Surgical History  Procedure Laterality Date  . Back surgery    . Neck surgery     Family History  Problem Relation Age of Onset  . Cancer Brother    Social History  Substance Use Topics  . Smoking status: Former Smoker -- 1.00 packs/day for 40 years    Quit date: 07/23/2014  . Smokeless tobacco: None  . Alcohol Use: No    Review of Systems  Respiratory: Positive for shortness of breath (chronically).   Gastrointestinal: Negative for nausea and vomiting.  Neurological: Positive for dizziness. Negative for weakness and headaches.  All other systems reviewed and are negative.     Allergies  Aspirin and Shellfish allergy  Home Medications   Prior to Admission medications   Medication Sig Start Date End Date Taking? Authorizing Provider  amLODipine (NORVASC) 5 MG tablet Take 1 tablet (5 mg total) by mouth daily.  08/17/14  Yes Velvet Bathe, MD  clopidogrel (PLAVIX) 75 MG tablet Take 1 tablet (75 mg total) by mouth daily. 08/17/14  Yes Velvet Bathe, MD  finasteride (PROSCAR) 5 MG tablet Take 5 mg by mouth daily.   Yes Historical Provider, MD  loratadine (CLARITIN) 10 MG tablet Take 10 mg by mouth daily.   Yes Historical Provider, MD  omeprazole (PRILOSEC) 20 MG capsule Take 20 mg by mouth daily.   Yes Historical Provider, MD  sertraline (ZOLOFT) 100 MG tablet Take 100 mg by mouth daily.   Yes Historical Provider, MD  simvastatin (ZOCOR) 20 MG tablet Take 1 tablet (20 mg total) by mouth daily at 6 PM. 08/17/14  Yes Velvet Bathe, MD  terazosin (HYTRIN) 5 MG capsule Take 5 mg by mouth at bedtime.   Yes Historical Provider, MD   BP 102/68 mmHg  Pulse 59  Temp(Src) 97.5 F (36.4 C) (Oral)  Resp 19  Ht '6\' 2"'$  (1.88 m)  Wt 260 lb (117.935 kg)  BMI 33.37 kg/m2  SpO2 99% Physical Exam  Constitutional: He is oriented to person, place, and time. He appears well-developed and well-nourished. No distress.  HENT:  Head: Normocephalic and atraumatic.  Eyes: Conjunctivae are normal.  Neck: Neck supple. No tracheal deviation present.  Cardiovascular: Normal rate, regular rhythm and normal heart sounds.   Pulmonary/Chest: Effort normal. No respiratory distress. He has no wheezes. He has no rales.  Abdominal: Soft. He exhibits no distension.  Neurological: He is alert and oriented  to person, place, and time. He has normal strength. No cranial nerve deficit or sensory deficit. Coordination normal. GCS eye subscore is 4. GCS verbal subscore is 5. GCS motor subscore is 6.  Normal finger to nose testing  Skin: Skin is warm and dry.  Psychiatric: He has a normal mood and affect.    ED Course  Procedures (including critical care time) Labs Review Labs Reviewed  BASIC METABOLIC PANEL - Abnormal; Notable for the following:    Glucose, Bld 112 (*)    All other components within normal limits  CBC WITH  DIFFERENTIAL/PLATELET - Abnormal; Notable for the following:    RBC 3.88 (*)    Hemoglobin 11.5 (*)    HCT 35.5 (*)    All other components within normal limits  URINALYSIS, ROUTINE W REFLEX MICROSCOPIC (NOT AT [facility]) - Abnormal; Notable for the following:    Color, Urine AMBER (*)    Leukocytes, UA TRACE (*)    All other components within normal limits  URINE MICROSCOPIC-ADD ON - Abnormal; Notable for the following:    Crystals CA OXALATE CRYSTALS (*)    All other components within normal limits  I-STAT ARTERIAL BLOOD GAS, ED - Abnormal; Notable for the following:    pO2, Arterial 65.0 (*)    Bicarbonate 25.4 (*)    All other components within normal limits  BRAIN NATRIURETIC PEPTIDE  I-STAT TROPOININ, ED    Imaging Review Dg Chest 2 View  10/21/2014  CLINICAL DATA:  Shortness of breath.  Left arm pain.  Hypertension. EXAM: CHEST - 2 VIEW COMPARISON:  CTA of the chest 08/12/2014 FINDINGS: The heart size is normal. Linear bibasilar opacities are present. The lungs are otherwise clear. There is no edema or effusion to suggest failure. Cervical spine fusion is again noted. The visualized soft tissues and bony thorax are otherwise unremarkable. IMPRESSION: 1. Minimal bibasilar linear opacities likely reflect atelectasis. 2. No other significant airspace disease. Electronically Signed   By: San Morelle M.D.   On: 10/21/2014 10:10   Ct Head Wo Contrast  10/21/2014  CLINICAL DATA:  Dizziness and hypertension EXAM: CT HEAD WITHOUT CONTRAST TECHNIQUE: Contiguous axial images were obtained from the base of the skull through the vertex without intravenous contrast. COMPARISON:  August 12, 2014 FINDINGS: The ventricles are normal in size and configuration. There is no intracranial mass hemorrhage, extra-axial fluid collection, or midline shift. There is patchy small vessel disease in the centra semiovale bilaterally, primarily posteriorly and superiorly, slightly more on the right than on the  left, stable. There is no new gray-white compartment lesion. No acute infarct evident. The bony calvarium appears intact. The mastoid air cells are clear. There is opacification of multiple ethmoid air cells bilaterally. There is also inferior frontal sinus mucosal thickening bilaterally and retention cysts in the left maxillary antrum. IMPRESSION: Patchy supratentorial small vessel disease, stable. No intracranial mass, hemorrhage, or extra-axial fluid collection. No acute infarct. Areas of paranasal sinus disease. Mastoids bilaterally are clear. Electronically Signed   By: Lowella Grip III M.D.   On: 10/21/2014 13:26   I have personally reviewed and evaluated these images and lab results as part of my medical decision-making.   EKG Interpretation   Date/Time:  Friday October 21 2014 10:30:52 EDT Ventricular Rate:  61 PR Interval:  180 QRS Duration: 102 QT Interval:  442 QTC Calculation: 445 R Axis:   48 Text Interpretation:  Sinus rhythm Abnormal R-wave progression, early  transition Minimal ST elevation, inferior leads No  significant change  since last tracing Confirmed by Eligha Kmetz MD, Harim Bi [phone]) on 10/21/2014  10:58:03 AM      MDM   Final diagnoses:  Dizziness    69 y.o. male presents with intermittent lightheadedness over the last 2 days. He has h/o COPD on home oxygen and has chronic shortness of breath. CT head negative for intracranial bleeding or other acute cause of symptoms, no significant cardiologic, metabolic, or infectious etiologies identified on screening. Blood gas has no elevated CO2 so Pt not retaining, is c/w elevated A-a gradient and hypoxemia likely c/w V/Q mismatch in setting of chronic pulmonary disease but Pt breathing comfortably and saturating well on telemetry monitoring. Has been holding his HTN meds since symptoms started and normotensive here so recommended he continue to hold them until able to f/u with PCP in case these symptoms    Leo Grosser,  MD 10/22/14 1605

## 2014-12-16 LAB — IFOBT (OCCULT BLOOD): IFOBT: NEGATIVE

## 2015-03-28 ENCOUNTER — Encounter: Payer: Self-pay | Admitting: Gastroenterology

## 2015-05-19 ENCOUNTER — Encounter: Payer: Self-pay | Admitting: Gastroenterology

## 2015-05-19 ENCOUNTER — Ambulatory Visit (INDEPENDENT_AMBULATORY_CARE_PROVIDER_SITE_OTHER): Payer: Medicare HMO | Admitting: Gastroenterology

## 2015-05-19 VITALS — BP 122/80 | HR 88 | Ht 71.5 in | Wt 262.1 lb

## 2015-05-19 DIAGNOSIS — D649 Anemia, unspecified: Secondary | ICD-10-CM

## 2015-05-19 DIAGNOSIS — Z1211 Encounter for screening for malignant neoplasm of colon: Secondary | ICD-10-CM

## 2015-05-19 DIAGNOSIS — K5900 Constipation, unspecified: Secondary | ICD-10-CM

## 2015-05-19 NOTE — Patient Instructions (Signed)
We will call you with a date for the Colonoscopy recommended. This will be scheduled for August 2017.  Thank you for choosing [facility]  Dr Wilfrid Lund III

## 2015-05-19 NOTE — Progress Notes (Signed)
[facility] Consult Note:  History: Jeremy Landry 05/19/2015  Referring physician: Babette Relic, NP  Reason for consult/chief complaint: iron def Anemia and Shortness of Breath   Subjective HPI:  This patient was referred by a local health clinic for reported iron deficiency anemia. We initially had no lab reports and still have no office notes from the  referring provider. The patient is also a very limited historian. The only reports he had in his possession were a negative FIT stool test. He denies abdominal pain or rectal bleeding, but he tends toward constipation. He might go up to 2 weeks without a BM. He gets some of his care at the [place], and thinks he had a colonoscopy there over 10 years ago. He reports being admitted to an unknown hospital several months ago with weakness and numbness down both legs, and apparently this required a month stay in a nursing facility afterwards. He has chronic dyspnea and sometimes wears oxygen.  ROS:  Review of Systems  Constitutional: Positive for fatigue. Negative for appetite change and unexpected weight change.  HENT: Negative for mouth sores and voice change.   Eyes: Negative for pain and redness.  Respiratory: Positive for shortness of breath. Negative for cough.   Cardiovascular: Negative for chest pain and palpitations.  Genitourinary: Positive for urgency. Negative for dysuria and hematuria.  Musculoskeletal: Positive for back pain. Negative for myalgias and arthralgias.  Skin: Negative for pallor and rash.  Neurological: Positive for headaches. Negative for weakness.  Hematological: Negative for adenopathy.  Psychiatric/Behavioral: Positive for dysphoric mood. The patient is nervous/anxious.      Past Medical History: Past Medical History  Diagnosis Date  . Hypertension   . GERD (gastroesophageal reflux disease)   . Prostate cancer ([place])   . COPD (chronic obstructive pulmonary disease) ([place])   . Congestive heart  failure ([place])   . Alcoholism ([place])   . Anxiety   . Chronic headaches   . Depression   . Glaucoma   . Kidney stones   . Pneumonia      Past Surgical History: Past Surgical History  Procedure Laterality Date  . Cervical disc surgery    . Lumbar disc surgery      x 3     Family History: Family History  Problem Relation Age of Onset  . Bone cancer Brother     in leg  . Colon cancer Maternal Uncle     Social History: Social History   Social History  . Marital Status: Divorced    Spouse Name: N/A  . Number of Children: 0  . Years of Education: N/A   Occupational History  . retired    Social History Main Topics  . Smoking status: Former Smoker -- 1.00 packs/day for 40 years    Quit date: 07/23/2014  . Smokeless tobacco: Never Used  . Alcohol Use: 0.0 oz/week    0 Standard drinks or equivalent per week     Comment: ocassional beer once a week  . 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 Sexual Activity: Not Asked   Other Topics Concern  . None   Social History Narrative    Allergies: Allergies  Allergen Reactions  . Aspirin Anaphylaxis  . Shellfish Allergy Anaphylaxis  . Lisinopril   . Other     Raisins  . Peanut-Containing Drug Products     Peanut allergy    Outpatient Meds: Current Outpatient Prescriptions  Medication Sig Dispense Refill  . amLODipine (NORVASC) 5 MG tablet Take 1 tablet (5 mg total) by mouth daily.    . clopidogrel (PLAVIX) 75 MG tablet Take 1 tablet (75 mg total) by mouth daily. 30 tablet 0  . finasteride (PROSCAR) 5 MG tablet Take 5 mg by mouth daily.    Marland Kitchen loratadine (CLARITIN) 10 MG tablet Take 10 mg by mouth daily.    Marland Kitchen omeprazole (PRILOSEC) 20 MG capsule Take 20 mg by mouth daily.    . OXYGEN Inhale 2 L/min into the lungs as needed.    . sertraline (ZOLOFT) 100 MG tablet Take 100 mg by mouth daily.    . simvastatin (ZOCOR) 20 MG tablet Take 1 tablet (20 mg total) by mouth daily at 6 PM. 30  tablet 0  . terazosin (HYTRIN) 5 MG capsule Take 5 mg by mouth at bedtime.     No current facility-administered medications for this visit.      ___________________________________________________________________ Objective  Exam:  BP 122/80 mmHg  Pulse 88  Ht 5' 11.5" (1.816 m)  Wt 262 lb 2 oz (118.899 kg)  BMI 36.05 kg/m2   General: this is a(n) Elderly man with good muscle mass, not acutely ill-appearing.   Eyes: sclera anicteric, no redness  ENT: oral mucosa moist without lesions, no cervical or supraclavicular lymphadenopathy, good dentition  CV: RRR without murmur, S1/S2, no JVD, no peripheral edema  Resp: Good air entry, somewhat diminished it sounds at the right base, normal RR and effort noted  GI: soft, no tenderness, with active bowel sounds. No guarding or palpable organomegaly noted.  Skin; warm and dry, no rash or jaundice noted  Neuro: awake, alert and oriented x 3. Normal gross motor function and fluent speech  Labs:  On 03/10/2015 WBC 3.0 hemoglobin 12.2 MCV 92 platelets 278, normal CMP    Assessment: Encounter Diagnoses  Name Primary?  Marland Kitchen Anemia, unspecified anemia type Yes  . Constipation, unspecified constipation type   . Special screening for malignant neoplasms, colon     The patient has very mild normocytic anemia but does not appear to be iron deficient. He also had a negative outpatient stool test for occult blood He has chronic constipation that is been stable for years, it does not sound likely to be obstructive. He also has COPD and is oxygen requiring severity of it is unknown. He is also on Plavix for unknown reasons. Plan:  He is in need of a routine screening colonoscopy, but will have to wait until we can get some primary care and find out whether or not he can stop his Plavix several days prior.  Thank you for the courtesy of this consult.  Please call me with any questions or concerns.  Nelida Meuse III  CC: see referring  provider above

## 2015-07-24 ENCOUNTER — Telehealth: Payer: Self-pay | Admitting: Gastroenterology

## 2015-07-24 ENCOUNTER — Other Ambulatory Visit: Payer: Self-pay

## 2015-07-24 DIAGNOSIS — Z86010 Personal history of colonic polyps: Secondary | ICD-10-CM

## 2015-07-24 DIAGNOSIS — D649 Anemia, unspecified: Secondary | ICD-10-CM

## 2015-07-24 DIAGNOSIS — K5900 Constipation, unspecified: Secondary | ICD-10-CM

## 2015-07-24 NOTE — Telephone Encounter (Signed)
Jeremy Rota do you know anything about this colonoscopy?

## 2015-07-25 NOTE — Telephone Encounter (Signed)
Pt has been scheduled at [facility] for a colonoscopy on 09-05-2015 '@10'$ :30am. Will inform pt and find out ho to contact regarding his Plavix.

## 2015-07-28 ENCOUNTER — Other Ambulatory Visit: Payer: Self-pay

## 2015-07-28 DIAGNOSIS — D649 Anemia, unspecified: Secondary | ICD-10-CM

## 2015-07-28 DIAGNOSIS — K5900 Constipation, unspecified: Secondary | ICD-10-CM

## 2015-07-28 NOTE — Telephone Encounter (Signed)
Left message for pt to call.

## 2015-07-28 NOTE — Telephone Encounter (Signed)
To find out who manages the Plavix. I called the pts pharmacy ([facility]) Dr Sherilyn Cooter was the last to fill the pts Plavix. I sent the blood thinner hold request to Dr Emelia Salisbury office.

## 2015-08-01 ENCOUNTER — Other Ambulatory Visit: Payer: Self-pay

## 2015-08-01 MED ORDER — NA SULFATE-K SULFATE-MG SULF 17.5-3.13-1.6 GM/177ML PO SOLN: 1.0000 | Freq: Once | ORAL | 0 refills | Status: AC

## 2015-08-01 NOTE — Telephone Encounter (Signed)
Patient came into the clinic today to get directions for his colonoscopy. He sates understanding and will pick up his bowel prep. Also advised we will contact him after receiving directions about his PLAVIX

## 2015-08-24 ENCOUNTER — Encounter (HOSPITAL_COMMUNITY): Payer: Self-pay | Admitting: *Deleted

## 2015-08-28 ENCOUNTER — Telehealth: Payer: Self-pay

## 2015-08-28 NOTE — Telephone Encounter (Signed)
Called Dr Ailene Rud office to receive a verbal order to hold Plavix for patients upcoming colonoscopy. I spoke to [place] at the office and explained that we are still awaiting clearance. I have faxed the request multiple times with no return. Roger gave a verbal clearance to hold Plavix 5 days.

## 2015-08-28 NOTE — Telephone Encounter (Signed)
Pt has been notified and aware to hold his Plavix 5 days. He states understanding.

## 2015-09-05 ENCOUNTER — Encounter (HOSPITAL_COMMUNITY): Payer: Self-pay

## 2015-09-05 ENCOUNTER — Ambulatory Visit (HOSPITAL_COMMUNITY): Payer: Medicare HMO | Admitting: Anesthesiology

## 2015-09-05 ENCOUNTER — Other Ambulatory Visit: Payer: Self-pay

## 2015-09-05 ENCOUNTER — Encounter (HOSPITAL_COMMUNITY)
Admission: RE | Disposition: A | Discharge status: Home / Self Care | Payer: Self-pay | Source: Ambulatory Visit | Attending: Gastroenterology

## 2015-09-05 ENCOUNTER — Ambulatory Visit (HOSPITAL_COMMUNITY)
Admission: RE | Admit: 2015-09-05 | Discharge: 2015-09-05 | Disposition: A | Discharge status: Home / Self Care | Payer: Medicare HMO | Source: Ambulatory Visit | Attending: Gastroenterology | Admitting: Gastroenterology

## 2015-09-05 DIAGNOSIS — D649 Anemia, unspecified: Secondary | ICD-10-CM

## 2015-09-05 DIAGNOSIS — D125 Benign neoplasm of sigmoid colon: Secondary | ICD-10-CM

## 2015-09-05 DIAGNOSIS — Z7902 Long term (current) use of antithrombotics/antiplatelets: Secondary | ICD-10-CM | POA: Diagnosis not present

## 2015-09-05 DIAGNOSIS — K219 Gastro-esophageal reflux disease without esophagitis: Secondary | ICD-10-CM | POA: Insufficient documentation

## 2015-09-05 DIAGNOSIS — F329 Major depressive disorder, single episode, unspecified: Secondary | ICD-10-CM | POA: Diagnosis not present

## 2015-09-05 DIAGNOSIS — I509 Heart failure, unspecified: Secondary | ICD-10-CM | POA: Insufficient documentation

## 2015-09-05 DIAGNOSIS — K640 First degree hemorrhoids: Secondary | ICD-10-CM | POA: Insufficient documentation

## 2015-09-05 DIAGNOSIS — Z79899 Other long term (current) drug therapy: Secondary | ICD-10-CM | POA: Insufficient documentation

## 2015-09-05 DIAGNOSIS — K5900 Constipation, unspecified: Secondary | ICD-10-CM | POA: Diagnosis not present

## 2015-09-05 DIAGNOSIS — K5730 Diverticulosis of large intestine without perforation or abscess without bleeding: Secondary | ICD-10-CM | POA: Diagnosis not present

## 2015-09-05 DIAGNOSIS — I110 Hypertensive heart disease with heart failure: Secondary | ICD-10-CM | POA: Diagnosis not present

## 2015-09-05 DIAGNOSIS — J449 Chronic obstructive pulmonary disease, unspecified: Secondary | ICD-10-CM | POA: Diagnosis not present

## 2015-09-05 DIAGNOSIS — Z9981 Dependence on supplemental oxygen: Secondary | ICD-10-CM | POA: Insufficient documentation

## 2015-09-05 DIAGNOSIS — Z87891 Personal history of nicotine dependence: Secondary | ICD-10-CM | POA: Diagnosis not present

## 2015-09-05 DIAGNOSIS — K635 Polyp of colon: Secondary | ICD-10-CM | POA: Insufficient documentation

## 2015-09-05 DIAGNOSIS — Z86010 Personal history of colonic polyps: Secondary | ICD-10-CM

## 2015-09-05 HISTORY — PX: COLONOSCOPY WITH PROPOFOL: SHX5780

## 2015-09-05 SURGERY — COLONOSCOPY WITH PROPOFOL
Anesthesia: Monitor Anesthesia Care

## 2015-09-05 MED ORDER — LIDOCAINE 2% (20 MG/ML) 5 ML SYRINGE
INTRAMUSCULAR | Status: AC
  Filled 2015-09-05: qty 5

## 2015-09-05 MED ORDER — SODIUM CHLORIDE 0.9 % IV SOLN: INTRAVENOUS | Status: DC

## 2015-09-05 MED ORDER — LACTATED RINGERS IV SOLN
INTRAVENOUS | Status: DC
  Administered 2015-09-05: 09:00:00 via INTRAVENOUS

## 2015-09-05 MED ORDER — PROPOFOL 10 MG/ML IV BOLUS
INTRAVENOUS | Status: AC
  Filled 2015-09-05: qty 40

## 2015-09-05 MED ORDER — PROPOFOL 500 MG/50ML IV EMUL
INTRAVENOUS | Status: DC | PRN
  Administered 2015-09-05: 100 ug/kg/min via INTRAVENOUS

## 2015-09-05 MED ORDER — FENTANYL CITRATE (PF) 100 MCG/2ML IJ SOLN: 25.0000 ug | INTRAMUSCULAR | Status: DC | PRN

## 2015-09-05 MED ORDER — ONDANSETRON HCL 4 MG/2ML IJ SOLN: 4.0000 mg | Freq: Once | INTRAMUSCULAR | Status: DC | PRN

## 2015-09-05 MED ORDER — LIDOCAINE HCL (CARDIAC) 20 MG/ML IV SOLN
INTRAVENOUS | Status: DC | PRN
  Administered 2015-09-05: 50 mg via INTRAVENOUS

## 2015-09-05 MED ORDER — PROPOFOL 10 MG/ML IV BOLUS
INTRAVENOUS | Status: DC | PRN
  Administered 2015-09-05: 30 mg via INTRAVENOUS
  Administered 2015-09-05: 20 mg via INTRAVENOUS

## 2015-09-05 SURGICAL SUPPLY — 22 items

## 2015-09-05 NOTE — Transfer of Care (Signed)
Immediate Anesthesia Transfer of Care Note  Patient: Jeremy Landry  Procedure(s) Performed: Procedure(s): COLONOSCOPY WITH PROPOFOL (N/A)  Patient Location: PACU  Anesthesia Type:MAC  Level of Consciousness: awake, alert  and oriented  Airway & Oxygen Therapy: Patient Spontanous Breathing and Patient connected to face mask oxygen  Post-op Assessment: Report given to RN and Post -op Vital signs reviewed and stable  Post vital signs: Reviewed and stable  Last Vitals:  Vitals:   09/05/15 0839 09/05/15 0925  BP: (!) 170/92 (!) 143/84  Pulse: 60 82  Resp: 18 (!) 27  Temp: 36.7 C 36.7 C    Last Pain:  Vitals:   09/05/15 0925  TempSrc: Oral         Complications: No apparent anesthesia complications

## 2015-09-05 NOTE — Anesthesia Preprocedure Evaluation (Addendum)
Anesthesia Evaluation  Patient identified by MRN, date of birth, ID band Patient awake    Reviewed: Allergy & Precautions, H&P , NPO status , Patient's Chart, lab work & pertinent test results  History of Anesthesia Complications Negative for: history of anesthetic complications  Airway Mallampati: II  TM Distance: >3 FB Neck ROM: full    Dental no notable dental hx.    Pulmonary COPD,  oxygen dependent, former smoker,    Pulmonary exam normal breath sounds clear to auscultation       Cardiovascular hypertension, +CHF  Normal cardiovascular exam Rhythm:regular Rate:Normal     Neuro/Psych  Headaches, PSYCHIATRIC DISORDERS Anxiety Depression    GI/Hepatic GERD  ,(+)     substance abuse  alcohol use,   Endo/Other  negative endocrine ROS  Renal/GU Renal disease     Musculoskeletal   Abdominal   Peds  Hematology negative hematology ROS (+)   Anesthesia Other Findings Patient is on plavix but difficult to tell why, seems subscribed by neurology though no note found, possible related to central spinal cord blood supply, COPD on O2,, inconsistent medical follow up  Reproductive/Obstetrics negative OB ROS                            Anesthesia Physical Anesthesia Plan  ASA: III  Anesthesia Plan: MAC   Post-op Pain Management:    Induction: Intravenous  Airway Management Planned: Simple Face Mask  Additional Equipment:   Intra-op Plan:   Post-operative Plan:   Informed Consent: I have reviewed the patients History and Physical, chart, labs and discussed the procedure including the risks, benefits and alternatives for the proposed anesthesia with the patient or authorized representative who has indicated his/her understanding and acceptance.   Dental Advisory Given  Plan Discussed with: Anesthesiologist, CRNA and Surgeon  Anesthesia Plan Comments:         Anesthesia Quick  Evaluation

## 2015-09-05 NOTE — Discharge Instructions (Signed)
Resume plavix (clopidogrel) 5 days from now  YOU HAD AN ENDOSCOPIC PROCEDURE TODAY: Refer to the procedure report and other information in the discharge instructions given to you for any specific questions about what was found during the examination. If this information does not answer your questions, please call [facility] office at [phone] to clarify.   YOU SHOULD EXPECT: Some feelings of bloating in the abdomen. Passage of more gas than usual. Walking can help get rid of the air that was put into your GI tract during the procedure and reduce the bloating. If you had a lower endoscopy (such as a colonoscopy or flexible sigmoidoscopy) you may notice spotting of blood in your stool or on the toilet paper. Some abdominal soreness may be present for a day or two, also.  DIET: Your first meal following the procedure should be a light meal and then it is ok to progress to your normal diet. A half-sandwich or bowl of soup is an example of a good first meal. Heavy or fried foods are harder to digest and may make you feel nauseous or bloated. Drink plenty of fluids but you should avoid alcoholic beverages for 24 hours. If you had a esophageal dilation, please see attached instructions for diet.   ACTIVITY: Your care partner should take you home directly after the procedure. You should plan to take it easy, moving slowly for the rest of the day. You can resume normal activity the day after the procedure however YOU SHOULD NOT DRIVE, use power tools, machinery or perform tasks that involve climbing or major physical exertion for 24 hours (because of the sedation medicines used during the test).   SYMPTOMS TO REPORT IMMEDIATELY: A gastroenterologist can be reached at any hour. Please call [phone]  for any of the following symptoms:  Following lower endoscopy (colonoscopy, flexible sigmoidoscopy) Excessive amounts of blood in the stool  Significant tenderness, worsening of abdominal pains  Swelling of  the abdomen that is new, acute  Fever of 100 or higher  Following upper endoscopy (EGD, EUS, ERCP, esophageal dilation) Vomiting of blood or coffee ground material  New, significant abdominal pain  New, significant chest pain or pain under the shoulder blades  Painful or persistently difficult swallowing  New shortness of breath  Black, tarry-looking or red, bloody stools  FOLLOW UP:  If any biopsies were taken you will be contacted by phone or by letter within the next 1-3 weeks. Call [phone]  if you have not heard about the biopsies in 3 weeks.  Please also call with any specific questions about appointments or follow up tests.

## 2015-09-05 NOTE — H&P (Signed)
  History:  This patient presents for endoscopic testing for constipation.   Rayford Halsted Referring physician: Javier Docker, MD  Past Medical History: Past Medical History:  Diagnosis Date  . Alcoholism ([place])   . Anxiety   . Chronic headaches   . Congestive heart failure ([place])   . COPD (chronic obstructive pulmonary disease) ([place])   . Depression   . GERD (gastroesophageal reflux disease)   . Glaucoma   . Hypertension   . Kidney stones   . Pneumonia   . Prostate cancer [facility])      Past Surgical History: Past Surgical History:  Procedure Laterality Date  . CERVICAL DISC SURGERY    . LUMBAR DISC SURGERY     x 3    Allergies: Allergies  Allergen Reactions  . Aspirin Anaphylaxis  . Shellfish Allergy Anaphylaxis  . Lisinopril   . Other     Raisins  . Peanut-Containing Drug Products     Peanut allergy    Outpatient Meds: No current facility-administered medications for this encounter.       ___________________________________________________________________ Objective   Exam:  There were no vitals taken for this visit.   CV: RRR without murmur, S1/S2, no JVD, no peripheral edema  Resp: clear to auscultation bilaterally, normal RR and effort noted  GI: soft, no tenderness, with active bowel sounds. No guarding or palpable organomegaly noted.  Neuro: awake, alert and oriented x 3. Normal gross motor function and fluent speech    Assessment:  Constipation   Plan:  Colonoscopy.  plavix held 5 days   Nelida Meuse III

## 2015-09-05 NOTE — Interval H&P Note (Signed)
History and Physical Interval Note:  09/05/2015 8:55 AM  Jeremy Landry  has presented today for surgery, with the diagnosis of anemia/constipation/hx of colon polyps  The various methods of treatment have been discussed with the patient and family. After consideration of risks, benefits and other options for treatment, the patient has consented to  Procedure(s): COLONOSCOPY WITH PROPOFOL (N/A) as a surgical intervention .  The patient's history has been reviewed, patient examined, no change in status, stable for surgery.  I have reviewed the patient's chart and labs.  Questions were answered to the patient's satisfaction.     Nelida Meuse III

## 2015-09-05 NOTE — Op Note (Signed)
[facility] Patient Name: Jeremy Landry Procedure Date: 09/05/2015 MRN: [id number] Attending MD: Estill Cotta. Loletha Carrow , MD Date of Birth: May 03, 1945 CSN: [id number] Age: 70 Admit Type: Outpatient Procedure:                Colonoscopy Indications:              Constipation Providers:                Mallie Mussel L. Loletha Carrow, MD, Malka So, RN, Cherylynn Ridges, Technician, [facility], CRNA Referring MD:             Ralene Bathe. [place] Medicines:                Monitored Anesthesia Care Complications:            No immediate complications. Estimated Blood Loss:     Estimated blood loss: none. Procedure:                Pre-Anesthesia Assessment:                           - Prior to the procedure, a History and Physical                            was performed, and patient medications and                            allergies were reviewed. The patient's tolerance of                            previous anesthesia was also reviewed. The risks                            and benefits of the procedure and the sedation                            options and risks were discussed with the patient.                            All questions were answered, and informed consent                            was obtained. Prior Anticoagulants: The patient has                            taken Plavix (clopidogrel), last dose was 5 days                            prior to procedure. ASA Grade Assessment: III - A                            patient with severe systemic disease. After  reviewing the risks and benefits, the patient was                            deemed in satisfactory condition to undergo the                            procedure.                           After obtaining informed consent, the colonoscope                            was passed under direct vision. Throughout the                            procedure, the patient's blood  pressure, pulse, and                            oxygen saturations were monitored continuously. The                            EC-[id number] ([id number]) scope was introduced through                            the anus and advanced to the the cecum, identified                            by appendiceal orifice and ileocecal valve. The                            colonoscopy was performed without difficulty. The                            patient tolerated the procedure well. The quality                            of the bowel preparation was good. The ileocecal                            valve, appendiceal orifice, and rectum were                            photographed. The bowel preparation used was                            SUPREP. The quality of the bowel preparation was                            evaluated using the BBPS [facility] Bowel Preparation                            Scale) with scores of: Right Colon = 2, Transverse  Colon = 2 and Left Colon = 2. The total BBPS score                            equals 6. Scope In: 9:04:57 AM Scope Out: 9:19:57 AM Scope Withdrawal Time: 0 hours 11 minutes 42 seconds  Total Procedure Duration: 0 hours 15 minutes 0 seconds  Findings:      The perianal and digital rectal examinations were normal.      A few small-mouthed diverticula were found in the sigmoid colon.      A 4 mm polyp was found in the sigmoid colon. The polyp was sessile. The       polyp was removed with a cold snare. Resection and retrieval were       complete.      Internal hemorrhoids were found during retroflexion. The hemorrhoids       were small and Grade I (internal hemorrhoids that do not prolapse).      The exam was otherwise without abnormality. Impression:               - Diverticulosis in the sigmoid colon.                           - One 4 mm polyp in the sigmoid colon, removed with                            a cold snare. Resected and retrieved.                            - Internal hemorrhoids.                           - The examination was otherwise normal. Moderate Sedation:      MAC sedation used Recommendation:           - Patient has a contact number available for                            emergencies. The signs and symptoms of potential                            delayed complications were discussed with the                            patient. Return to normal activities tomorrow.                            Written discharge instructions were provided to the                            patient.                           - Resume previous diet.                           - Resume Plavix (clopidogrel) at prior dose in 5  days.                           - Await pathology results.                           - Repeat colonoscopy is recommended for                            surveillance. The colonoscopy date will be                            determined after pathology results from today's                            exam become available for review. Procedure Code(s):        --- Professional ---                           [phone], Colonoscopy, flexible; with removal of                            tumor(s), polyp(s), or other lesion(s) by snare                            technique Diagnosis Code(s):        --- Professional ---                           K64.0, First degree hemorrhoids                           D12.5, Benign neoplasm of sigmoid colon                           K59.00, Constipation, unspecified                           K57.30, Diverticulosis of large intestine without                            perforation or abscess without bleeding CPT copyright 2016 American Medical Association. All rights reserved. The codes documented in this report are preliminary and upon coder review may  be revised to meet current compliance requirements. Henry L. Loletha Carrow, MD 09/05/2015 9:25:52 AM This report has been  signed electronically. Number of Addenda: 0

## 2015-09-05 NOTE — Anesthesia Postprocedure Evaluation (Signed)
Anesthesia Post Note  Patient: Jeremy Landry  Procedure(s) Performed: Procedure(s) (LRB): COLONOSCOPY WITH PROPOFOL (N/A)  Patient location during evaluation: PACU Anesthesia Type: MAC Level of consciousness: awake and alert Pain management: pain level controlled Vital Signs Assessment: post-procedure vital signs reviewed and stable Respiratory status: spontaneous breathing, nonlabored ventilation, respiratory function stable and patient connected to nasal cannula oxygen Cardiovascular status: stable and blood pressure returned to baseline Anesthetic complications: no    Last Vitals:  Vitals:   09/05/15 0839 09/05/15 0925  BP: (!) 170/92 (!) 143/84  Pulse: 60 82  Resp: 18 (!) 27  Temp: 36.7 C 36.7 C    Last Pain:  Vitals:   09/05/15 0925  TempSrc: Oral                 Zenaida Deed

## 2015-09-06 ENCOUNTER — Encounter (HOSPITAL_COMMUNITY): Payer: Self-pay | Admitting: Gastroenterology

## 2015-09-06 ENCOUNTER — Encounter: Payer: Self-pay | Admitting: Gastroenterology

## 2017-01-15 ENCOUNTER — Encounter: Payer: Self-pay | Admitting: Internal Medicine

## 2017-01-15 ENCOUNTER — Other Ambulatory Visit (HOSPITAL_COMMUNITY): Payer: Self-pay | Admitting: Hematology and Oncology

## 2017-01-15 ENCOUNTER — Ambulatory Visit (INDEPENDENT_AMBULATORY_CARE_PROVIDER_SITE_OTHER): Payer: No Typology Code available for payment source | Admitting: Internal Medicine

## 2017-01-15 VITALS — BP 134/84 | HR 90 | Ht 74.0 in | Wt 280.0 lb

## 2017-01-15 DIAGNOSIS — R058 Other specified cough: Secondary | ICD-10-CM

## 2017-01-15 DIAGNOSIS — R05 Cough: Secondary | ICD-10-CM

## 2017-01-15 DIAGNOSIS — R0609 Other forms of dyspnea: Secondary | ICD-10-CM | POA: Diagnosis not present

## 2017-01-15 DIAGNOSIS — C3490 Malignant neoplasm of unspecified part of unspecified bronchus or lung: Secondary | ICD-10-CM

## 2017-01-15 DIAGNOSIS — R0600 Dyspnea, unspecified: Secondary | ICD-10-CM | POA: Insufficient documentation

## 2017-01-15 NOTE — Patient Instructions (Addendum)
No need for follow up in this clinic unless directed to do so by Dr Gwenette Greet who plans to give you additional information about your lung tumor.  If you do return to this medical system for care,  You will need to provide [place] with a summary of your care at the [facility] and all active medications/ devices (0xygen) / inhalers in hand    Call us at [phone] when you get home and tell us exactly what medications you've been taking

## 2017-01-15 NOTE — Progress Notes (Signed)
Subjective:     Patient ID: Jeremy Landry, male   DOB: 1945/03/05,    MRN: [id number]  HPI   89 yobm quit smoking 07/2014  Referred by [facility] 01/15/17  for dx of Lung mass but in meantime saw Dr Gwenette Greet for FOB Jan 3rd with tissue dx pending on PET scan to be done    01/15/2017  f/u ov/[facility] Surgeon re:  Chief Complaint  Patient presents with  . Pulmonary Consult    [facility]  Pt states that he has had problems with his breathing for the past 2 yrs. He states that while waiting for this appt he had bronch and was dxed with lung ca.   doe = MMRC3 = can't walk 100 yards even at a slow pace at a flat grade s stopping due to sob  X 2 y Uses 02 at hs 3lpm and prn during the day  Extremely poor insight into medical issues has no idea what meds he's on Thinks he may be referred for surgery p PET   No obvious day to day or daytime variability or assoc excess/ purulent sputum or mucus plugs or hemoptysis or cp or chest tightness, subjective wheeze or overt sinus or hb symptoms. No unusual exposure hx or h/o childhood pna/ asthma or knowledge of premature birth.  Sleeping ok flat without nocturnal  or early am exacerbation  of respiratory  c/o's or need for noct saba. Also denies any obvious fluctuation of symptoms with weather or environmental changes or other aggravating or alleviating factors except as outlined above   Current Allergies, Complete Past Medical History, Past Surgical History, Family History, and Social History were reviewed in [employer] record.  ROS  The following are not active complaints unless bolded Hoarseness, sore throat, dysphagia, dental problems, itching, sneezing,  nasal congestion or discharge of excess mucus or purulent secretions, ear ache,   fever, chills, sweats, unintended wt loss or wt gain, classically pleuritic or exertional cp,  orthopnea pnd or leg swelling, presyncope, palpitations, abdominal pain, anorexia, nausea, vomiting,  diarrhea  or change in bowel habits or change in bladder habits, change in stools or change in urine, dysuria, hematuria,  rash, arthralgias, visual complaints, headache, numbness, weakness or ataxia or problems with walking or coordination,  change in mood/affect or memory.        Pt unsure of names  meds or how to use them         Review of Systems     Objective:   Physical Exam amb obese bm with upper airway pattern cough    Wt Readings from Last 3 Encounters:  01/15/17 280 lb (127 kg)  09/05/15 270 lb (122.5 kg)  05/19/15 262 lb 2 oz (118.9 kg)     Vital signs reviewed - Note on arrival 02 sats  98% on RA      HEENT: nl dentition, turbinates bilaterally, and oropharynx. Nl external ear canals without cough reflex   NECK :  without JVD/Nodes/TM/ nl carotid upstrokes bilaterally   LUNGS: no acc muscle use,  Nl contour chest which is clear to A and P bilaterally without cough on insp or exp maneuvers   CV:  RRR  no s3 or murmur or increase in P2, and no edema   ABD:  Quite obese soft and nontender with poor inspiratory excursion in the supine position. No bruits or organomegaly appreciated, bowel sounds nl  MS:  Nl gait/ ext warm  without deformities, calf tenderness, cyanosis or clubbing No obvious joint restrictions   SKIN: warm and dry without lesions    NEURO:  alert, approp, nl sensorium with  no motor or cerebellar deficits apparent.         Assessment:

## 2017-01-16 ENCOUNTER — Other Ambulatory Visit (HOSPITAL_COMMUNITY): Payer: Self-pay | Admitting: Hematology and Oncology

## 2017-01-16 DIAGNOSIS — C3490 Malignant neoplasm of unspecified part of unspecified bronchus or lung: Secondary | ICD-10-CM

## 2017-01-23 ENCOUNTER — Telehealth: Payer: Self-pay | Admitting: *Deleted

## 2017-01-23 ENCOUNTER — Telehealth: Payer: Self-pay | Admitting: Internal Medicine

## 2017-01-23 DIAGNOSIS — R05 Cough: Secondary | ICD-10-CM | POA: Insufficient documentation

## 2017-01-23 DIAGNOSIS — R058 Other specified cough: Secondary | ICD-10-CM | POA: Insufficient documentation

## 2017-01-23 NOTE — Telephone Encounter (Signed)
Spoke with the pt and he gave me a list of his current meds and they have been documented in his last ov note

## 2017-01-23 NOTE — Telephone Encounter (Signed)
[phone]

## 2017-01-23 NOTE — Assessment & Plan Note (Signed)
Spirometry 01/15/2017  FEV1 0.81 (24%)  Ratio 85 with no curvature on f/v loop / restrictive pattern    Despite smoking hx he has not evidence of copd and restrictive changes may be due to obesity > [facility]  - no pulmonary intervention or escaaltion of care indicated    Total time devoted to counseling  > 50 % of initial 60 min office visit:  review case with pt/ discussion of options/alternatives/ personally creating written customized instructions  in presence of pt  then going over those specific  Instructions directly with the pt including how to use all of the meds but in particular covering each new medication in detail and the difference between the maintenance= "automatic" meds and the prns using an action plan format for the latter (If this problem/symptom => do that organization reading Left to right).  Please see AVS from this visit for a full list of these instructions which I personally wrote for this pt and  are unique to this visit.

## 2017-01-23 NOTE — Telephone Encounter (Signed)
-----   Message from Tanda Rockers, MD sent at 01/23/2017  8:40 AM EST ----- Call find out what meds he takes

## 2017-01-23 NOTE — Telephone Encounter (Signed)
Attempted to call tabitha at the [facility] in [place] letting her know that I was faxing the [facility] from 01/15/17 to her on pt.  Left Tabitha a message telling her to call us back so we can know she did receive the fax from our office. Did receive confirmation stating the fax went through. Nothing further needed at this time.

## 2017-01-23 NOTE — Assessment & Plan Note (Addendum)
Upper airway cough syndrome (previously labeled PNDS),  is so named because it's frequently impossible to sort out how much is  CR/sinusitis with freq throat clearing (which can be related to primary GERD)   vs  causing  secondary (" extra esophageal")  GERD from wide swings in gastric pressure that occur with throat clearing, often  promoting self use of mint and menthol lozenges that reduce the lower esophageal sphincter tone and exacerbate the problem further in a cyclical fashion.   These are the same pts (now being labeled as having "irritable larynx syndrome" by some cough centers) who not infrequently have a history of having failed to tolerate ace inhibitors,  dry powder inhalers or biphosphonates or report having atypical/extraesophageal reflux symptoms that don't respond to standard doses of PPI  and are easily confused as having aecopd or asthma flares by even experienced allergists/ pulmonologists (myself included).    Advised by Dr Gwenette Greet the cough is thought to be due to the lung mass for which he is arranging rx and f/u here therefore can be prn if cough fails to respond to rx

## 2017-01-24 ENCOUNTER — Encounter: Payer: Self-pay | Admitting: Internal Medicine

## 2017-01-30 ENCOUNTER — Ambulatory Visit (HOSPITAL_COMMUNITY)
Admission: RE | Admit: 2017-01-30 | Discharge: 2017-01-30 | Disposition: A | Discharge status: Home / Self Care | Payer: Non-veteran care | Source: Ambulatory Visit | Attending: Hematology and Oncology | Admitting: Hematology and Oncology

## 2017-01-30 DIAGNOSIS — N289 Disorder of kidney and ureter, unspecified: Secondary | ICD-10-CM | POA: Insufficient documentation

## 2017-01-30 DIAGNOSIS — R911 Solitary pulmonary nodule: Secondary | ICD-10-CM | POA: Insufficient documentation

## 2017-01-30 DIAGNOSIS — C3490 Malignant neoplasm of unspecified part of unspecified bronchus or lung: Secondary | ICD-10-CM | POA: Diagnosis present

## 2017-01-30 DIAGNOSIS — M954 Acquired deformity of chest and rib: Secondary | ICD-10-CM | POA: Insufficient documentation

## 2017-01-30 LAB — GLUCOSE, CAPILLARY: Glucose-Capillary: 110 mg/dL — ABNORMAL HIGH (ref 65–99)

## 2017-01-30 MED ORDER — FLUDEOXYGLUCOSE F - 18 (FDG) INJECTION
14.0000 | Freq: Once | INTRAVENOUS | Status: AC | PRN
  Administered 2017-01-30: 14 via INTRAVENOUS

## 2017-01-31 NOTE — Pre-Procedure Instructions (Signed)
LUCILE DIDONATO  01/31/2017      [id number] [address], [place] [address] Phone: [phone] Fax: [phone]    Your procedure is scheduled on January 31  Report to [facility] at [place].M.  Call this number if you have problems the morning of surgery:  [phone]   Remember:  Do not eat food or drink liquids after midnight.  Continue all medications as directed by your physician except follow these medication instructions before surgery below   Take these medicines the morning of surgery with A SIP OF WATER  albuterol (PROAIR HFA) finasteride (PROSCAR) Olodaterol omeprazole (PRILOSEC) Eye drops if needed  7 days prior to surgery STOP taking any Aspirin(unless otherwise instructed by your surgeon), Aleve, Naproxen, Ibuprofen, Motrin, Advil, Goody's, BC's, all herbal medications, fish oil, and all vitamins    Do not wear jewelry  Do not wear lotions, powders, or cologne, or deodorant.  Men may shave face and neck.  Do not bring valuables to the hospital.  [facility] is not responsible for any belongings or valuables.  Contacts, dentures or bridgework may not be worn into surgery.  Leave your suitcase in the car.  After surgery it may be brought to your room.  For patients admitted to the hospital, discharge time will be determined by your treatment team.  Patients discharged the day of surgery will not be allowed to drive home.    Special instructions:   [facility]- Preparing For Surgery  Before surgery, you can play an important role. Because skin is not sterile, your skin needs to be as free of germs as possible. You can reduce the number of germs on your skin by washing with CHG (chlorahexidine gluconate) Soap before surgery.  CHG is an antiseptic cleaner which kills germs and bonds with the skin to continue killing germs even after washing.  Please do not use if you have an  allergy to CHG or antibacterial soaps. If your skin becomes reddened/irritated stop using the CHG.  Do not shave (including legs and underarms) for at least 48 hours prior to first CHG shower. It is OK to shave your face.  Please follow these instructions carefully.   1. Shower the NIGHT BEFORE SURGERY and the MORNING OF SURGERY with CHG.   2. If you chose to wash your hair, wash your hair first as usual with your normal shampoo.  3. After you shampoo, rinse your hair and body thoroughly to remove the shampoo.  4. Use CHG as you would any other liquid soap. You can apply CHG directly to the skin and wash gently with a scrungie or a clean washcloth.   5. Apply the CHG Soap to your body ONLY FROM THE NECK DOWN.  Do not use on open wounds or open sores. Avoid contact with your eyes, ears, mouth and genitals (private parts). Wash Face and genitals (private parts)  with your normal soap.  6. Wash thoroughly, paying special attention to the area where your surgery will be performed.  7. Thoroughly rinse your body with warm water from the neck down.  8. DO NOT shower/wash with your normal soap after using and rinsing off the CHG Soap.  9. Pat yourself dry with a CLEAN TOWEL.  10. Wear CLEAN PAJAMAS to bed the night before surgery, wear comfortable clothes the morning of surgery  11. Place CLEAN SHEETS on your bed the night of your  first shower and DO NOT SLEEP WITH PETS.    Day of Surgery: Do not apply any deodorants/lotions. Please wear clean clothes to the hospital/surgery center.      Please read over the following fact sheets that you were given.

## 2017-02-03 ENCOUNTER — Encounter (HOSPITAL_COMMUNITY): Payer: Self-pay

## 2017-02-03 ENCOUNTER — Encounter (HOSPITAL_COMMUNITY)
Admission: RE | Admit: 2017-02-03 | Discharge: 2017-02-03 | Disposition: A | Discharge status: Home / Self Care | Payer: Medicare HMO | Source: Ambulatory Visit | Attending: Hematology and Oncology | Admitting: Hematology and Oncology

## 2017-02-03 ENCOUNTER — Other Ambulatory Visit: Payer: Self-pay

## 2017-02-03 DIAGNOSIS — Z01812 Encounter for preprocedural laboratory examination: Secondary | ICD-10-CM | POA: Insufficient documentation

## 2017-02-03 DIAGNOSIS — I10 Essential (primary) hypertension: Secondary | ICD-10-CM | POA: Diagnosis not present

## 2017-02-03 HISTORY — DX: Personal history of urinary calculi: Z87.442

## 2017-02-03 HISTORY — DX: Malignant neoplasm of unspecified part of unspecified bronchus or lung: C34.90

## 2017-02-03 LAB — CBC
HEMATOCRIT: 41.4 % (ref 39.0–52.0)
HEMOGLOBIN: 13.2 g/dL (ref 13.0–17.0)
MCH: 29.6 pg (ref 26.0–34.0)
MCHC: 31.9 g/dL (ref 30.0–36.0)
MCV: 92.8 fL (ref 78.0–100.0)
Platelets: 267 10*3/uL (ref 150–400)
RBC: 4.46 MIL/uL (ref 4.22–5.81)
RDW: 13.9 % (ref 11.5–15.5)
WBC: 4.7 10*3/uL (ref 4.0–10.5)

## 2017-02-03 LAB — COMPREHENSIVE METABOLIC PANEL
ALBUMIN: 3.8 g/dL (ref 3.5–5.0)
ALT: 16 U/L — ABNORMAL LOW (ref 17–63)
ANION GAP: 9 (ref 5–15)
AST: 18 U/L (ref 15–41)
Alkaline Phosphatase: 68 U/L (ref 38–126)
BUN: 12 mg/dL (ref 6–20)
CHLORIDE: 107 mmol/L (ref 101–111)
CO2: 20 mmol/L — AB (ref 22–32)
Calcium: 8.8 mg/dL — ABNORMAL LOW (ref 8.9–10.3)
Creatinine, Ser: 0.87 mg/dL (ref 0.61–1.24)
GFR calc Af Amer: >60 mL/min (ref >60)
GFR calc non Af Amer: >60 mL/min (ref >60)
GLUCOSE: 105 mg/dL — AB (ref 65–99)
POTASSIUM: 4.4 mmol/L (ref 3.5–5.1)
SODIUM: 136 mmol/L (ref 135–145)
Total Bilirubin: 0.8 mg/dL (ref 0.3–1.2)
Total Protein: 7.3 g/dL (ref 6.5–8.1)

## 2017-02-03 NOTE — Progress Notes (Signed)
PCP - Dr. Romero Liner with the [facility] in [facility] - Dr. Gwenette Greet with the [facility] in [place] Cardiologist - patient unsure  Chest x-ray - n/a EKG - patient states he has had one recently but unsure when; requested EKG tracing from [facility] - patient states he has had one 2017/2018 but unsure when; requested stress test results from [facility] ECHO - patient states he has had one but unsure when; requested Echo from [facility] - patient states he has had one 12/2016 he believes; requested cath results from [facility] - patient denies however + stop bang, sent to PCP  Anesthesia review: Patient has hx of CHF  Patient denies shortness of breath, fever, cough and chest pain at PAT appointment   Patient verbalized understanding of instructions that were given to them at the PAT appointment. Patient was also instructed that they will need to review over the PAT instructions again at home before surgery.  Requested H&P from PCP at [facility]

## 2017-02-03 NOTE — Progress Notes (Signed)
   02/03/17 0958  OBSTRUCTIVE SLEEP APNEA  Have you ever been diagnosed with sleep apnea through a sleep study? No  Do you snore loudly (loud enough to be heard through closed doors)?  1  Do you often feel tired, fatigued, or sleepy during the daytime (such as falling asleep during driving or talking to someone)? 0  Has anyone observed you stop breathing during your sleep? 0  Do you have, or are you being treated for high blood pressure? 1  BMI more than 35 kg/m2? 1  Age > 50 (1-yes) 1  Neck circumference greater than:Male 16 inches or larger, Male 17inches or larger? 1  Male Gender (Yes=1) 1  Obstructive Sleep Apnea Score 6  Score 5 or greater  Results sent to PCP

## 2017-02-04 NOTE — H&P (Signed)
Anesthesia Chart Review:  Pt is a 72 year old male scheduled for MRI of brain with anesthesia on 02/06/2017  - Receives primary care and cardiology care at the [facility] is Christinia Gully, MD  PMH includes:  CHF, HTN, COPD, hx alcoholism, glaucoma, prostate cancer, lung cancer, GERD.  Former smoker (quit 07/23/14). BMI 35  Medications include: Albuterol, Plavix, Lasix, olodaterol, prilosec, terazosin.    BP (!) 145/77   Pulse 62   Temp 36.6 C   Resp 20   Ht 6\' 2"  (1.88 m)   Wt 275 lb (124.7 kg)   SpO2 96%   BMI 35.31 kg/m   Preoperative labs reviewed.    EKG 12/04/15 ([facility]): NSR  Cardiac cath 07/03/16 ([facility]): 1.  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 2.  Codominant coronary system. 3.  Normal LVEDP  Echo 09/28/15: 1.  LV upper normal in size.  Mild concentric LVH.  EF >55%.  No obvious regional wall motion abnormalities noted. 2.  LA mildly dilated. 3.  Aortic valve opens well.  No stenosis. 4.  There is trace mitral regurgitation. 5.  Tricuspid valve grossly normal. 6.  No pericardial effusion.  If no changes, I anticipate pt can proceed with surgery as scheduled.   Willeen Cass, FNP-BC [facility]/Anesthesiology Phone: [phone] 02/04/2017 12:43 PM

## 2017-02-04 NOTE — Progress Notes (Deleted)
  The note originally documented on this encounter has been moved the the encounter in which it belongs.  

## 2017-02-06 ENCOUNTER — Ambulatory Visit (HOSPITAL_COMMUNITY): Payer: Non-veteran care | Admitting: Emergency Medicine

## 2017-02-06 ENCOUNTER — Encounter (HOSPITAL_COMMUNITY): Payer: Self-pay | Admitting: Surgery

## 2017-02-06 ENCOUNTER — Ambulatory Visit (HOSPITAL_COMMUNITY): Payer: Non-veteran care | Admitting: Critical Care Medicine

## 2017-02-06 ENCOUNTER — Encounter (HOSPITAL_COMMUNITY): Admission: RE | Disposition: A | Discharge status: Home / Self Care | Payer: Self-pay | Source: Ambulatory Visit

## 2017-02-06 ENCOUNTER — Ambulatory Visit (HOSPITAL_COMMUNITY)
Admission: RE | Admit: 2017-02-06 | Discharge: 2017-02-06 | Disposition: A | Discharge status: Home / Self Care | Payer: Non-veteran care | Source: Ambulatory Visit | Attending: Hematology and Oncology | Admitting: Hematology and Oncology

## 2017-02-06 ENCOUNTER — Other Ambulatory Visit: Payer: Self-pay

## 2017-02-06 ENCOUNTER — Ambulatory Visit (HOSPITAL_COMMUNITY)
Admission: RE | Admit: 2017-02-06 | Discharge: 2017-02-06 | Disposition: A | Discharge status: Home / Self Care | Payer: Non-veteran care | Source: Ambulatory Visit | Attending: Internal Medicine | Admitting: Internal Medicine

## 2017-02-06 DIAGNOSIS — I509 Heart failure, unspecified: Secondary | ICD-10-CM | POA: Diagnosis not present

## 2017-02-06 DIAGNOSIS — C3490 Malignant neoplasm of unspecified part of unspecified bronchus or lung: Secondary | ICD-10-CM | POA: Insufficient documentation

## 2017-02-06 DIAGNOSIS — C61 Malignant neoplasm of prostate: Secondary | ICD-10-CM | POA: Insufficient documentation

## 2017-02-06 DIAGNOSIS — I2510 Atherosclerotic heart disease of native coronary artery without angina pectoris: Secondary | ICD-10-CM | POA: Diagnosis not present

## 2017-02-06 DIAGNOSIS — F419 Anxiety disorder, unspecified: Secondary | ICD-10-CM | POA: Insufficient documentation

## 2017-02-06 DIAGNOSIS — Z87891 Personal history of nicotine dependence: Secondary | ICD-10-CM | POA: Diagnosis not present

## 2017-02-06 DIAGNOSIS — I110 Hypertensive heart disease with heart failure: Secondary | ICD-10-CM | POA: Insufficient documentation

## 2017-02-06 DIAGNOSIS — H409 Unspecified glaucoma: Secondary | ICD-10-CM | POA: Diagnosis not present

## 2017-02-06 DIAGNOSIS — Z79899 Other long term (current) drug therapy: Secondary | ICD-10-CM | POA: Diagnosis not present

## 2017-02-06 DIAGNOSIS — K219 Gastro-esophageal reflux disease without esophagitis: Secondary | ICD-10-CM | POA: Diagnosis not present

## 2017-02-06 DIAGNOSIS — Z7902 Long term (current) use of antithrombotics/antiplatelets: Secondary | ICD-10-CM | POA: Diagnosis not present

## 2017-02-06 DIAGNOSIS — F329 Major depressive disorder, single episode, unspecified: Secondary | ICD-10-CM | POA: Diagnosis not present

## 2017-02-06 DIAGNOSIS — J449 Chronic obstructive pulmonary disease, unspecified: Secondary | ICD-10-CM | POA: Diagnosis not present

## 2017-02-06 DIAGNOSIS — Z9981 Dependence on supplemental oxygen: Secondary | ICD-10-CM | POA: Insufficient documentation

## 2017-02-06 DIAGNOSIS — F1021 Alcohol dependence, in remission: Secondary | ICD-10-CM | POA: Insufficient documentation

## 2017-02-06 HISTORY — PX: RADIOLOGY WITH ANESTHESIA: SHX6223

## 2017-02-06 SURGERY — MRI WITH ANESTHESIA
Anesthesia: General

## 2017-02-06 MED ORDER — FENTANYL CITRATE (PF) 250 MCG/5ML IJ SOLN
INTRAMUSCULAR | Status: DC | PRN
  Administered 2017-02-06 (×2): 50 ug via INTRAVENOUS

## 2017-02-06 MED ORDER — EPHEDRINE SULFATE-NACL 50-0.9 MG/10ML-% IV SOSY
PREFILLED_SYRINGE | INTRAVENOUS | Status: DC | PRN
  Administered 2017-02-06 (×2): 5 mg via INTRAVENOUS

## 2017-02-06 MED ORDER — PROMETHAZINE HCL 25 MG/ML IJ SOLN: 6.2500 mg | INTRAMUSCULAR | Status: DC | PRN

## 2017-02-06 MED ORDER — FENTANYL CITRATE (PF) 100 MCG/2ML IJ SOLN: 25.0000 ug | INTRAMUSCULAR | Status: DC | PRN

## 2017-02-06 MED ORDER — PROPOFOL 10 MG/ML IV BOLUS
INTRAVENOUS | Status: DC | PRN
  Administered 2017-02-06: 150 mg via INTRAVENOUS
  Administered 2017-02-06: 50 mg via INTRAVENOUS

## 2017-02-06 MED ORDER — ONDANSETRON HCL 4 MG/2ML IJ SOLN
INTRAMUSCULAR | Status: DC | PRN
  Administered 2017-02-06: 4 mg via INTRAVENOUS

## 2017-02-06 MED ORDER — MIDAZOLAM HCL 5 MG/5ML IJ SOLN
INTRAMUSCULAR | Status: DC | PRN
  Administered 2017-02-06 (×2): 1 mg via INTRAVENOUS

## 2017-02-06 MED ORDER — GADOBENATE DIMEGLUMINE 529 MG/ML IV SOLN
20.0000 mL | Freq: Once | INTRAVENOUS | Status: AC | PRN
  Administered 2017-02-06: 20 mL via INTRAVENOUS

## 2017-02-06 MED ORDER — DEXAMETHASONE SODIUM PHOSPHATE 10 MG/ML IJ SOLN
INTRAMUSCULAR | Status: DC | PRN
  Administered 2017-02-06: 10 mg via INTRAVENOUS

## 2017-02-06 MED ORDER — LACTATED RINGERS IV SOLN
INTRAVENOUS | Status: DC
  Administered 2017-02-06: 08:00:00 via INTRAVENOUS

## 2017-02-06 MED ORDER — LIDOCAINE 2% (20 MG/ML) 5 ML SYRINGE
INTRAMUSCULAR | Status: DC | PRN
  Administered 2017-02-06: 60 mg via INTRAVENOUS

## 2017-02-06 NOTE — Anesthesia Preprocedure Evaluation (Signed)
Anesthesia Evaluation  Patient identified by MRN, date of birth, ID band Patient awake    Reviewed: Allergy & Precautions, H&P , NPO status , Patient's Chart, lab work & pertinent test results  History of Anesthesia Complications Negative for: history of anesthetic complications  Airway Mallampati: II  TM Distance: >3 FB Neck ROM: full    Dental  (+) Dental Advisory Given, Missing   Pulmonary COPD,  oxygen dependent, former smoker,    Pulmonary exam normal breath sounds clear to auscultation       Cardiovascular hypertension, +CHF  Normal cardiovascular exam Rhythm:regular Rate:Normal     Neuro/Psych  Headaches, PSYCHIATRIC DISORDERS Anxiety Depression    GI/Hepatic GERD  ,(+)     substance abuse  alcohol use,   Endo/Other  negative endocrine ROS  Renal/GU Renal disease     Musculoskeletal   Abdominal   Peds  Hematology negative hematology ROS (+)   Anesthesia Other Findings   Reproductive/Obstetrics negative OB ROS                             Anesthesia Physical  Anesthesia Plan  ASA: III  Anesthesia Plan: General   Post-op Pain Management:    Induction: Intravenous  PONV Risk Score and Plan: 2 and Ondansetron and Dexamethasone  Airway Management Planned: Oral ETT  Additional Equipment:   Intra-op Plan:   Post-operative Plan: Extubation in OR  Informed Consent: I have reviewed the patients History and Physical, chart, labs and discussed the procedure including the risks, benefits and alternatives for the proposed anesthesia with the patient or authorized representative who has indicated his/her understanding and acceptance.   Dental Advisory Given  Plan Discussed with: Anesthesiologist, CRNA and Surgeon  Anesthesia Plan Comments:         Anesthesia Quick Evaluation

## 2017-02-06 NOTE — Transfer of Care (Signed)
Immediate Anesthesia Transfer of Care Note  Patient: Jeremy Landry  Procedure(s) Performed: MRI OF THE BRAIN WITH AND WITHOUT CONTRAST (N/A )  Patient Location: PACU  Anesthesia Type:General  Level of Consciousness: awake, alert  and oriented  Airway & Oxygen Therapy: Patient Spontanous Breathing and Patient connected to nasal cannula oxygen  Post-op Assessment: Report given to RN and Post -op Vital signs reviewed and stable  Post vital signs: Reviewed and stable  Last Vitals:  Vitals:   02/06/17 1213 02/06/17 1215  BP: (!) 149/83   Pulse: 68 68  Resp: (!) 24 18  Temp: 36.8 C   SpO2: 94% 94%    Last Pain:  Vitals:   02/06/17 0754  TempSrc: Oral      Patients Stated Pain Goal: 3 (73/71/06 2694)  Complications: No apparent anesthesia complications

## 2017-02-06 NOTE — Anesthesia Postprocedure Evaluation (Signed)
Anesthesia Post Note  Patient: Jeremy Landry  Procedure(s) Performed: MRI OF THE BRAIN WITH AND WITHOUT CONTRAST (N/A )     Patient location during evaluation: PACU Anesthesia Type: General Level of consciousness: sedated Pain management: pain level controlled Vital Signs Assessment: post-procedure vital signs reviewed and stable Respiratory status: spontaneous breathing and respiratory function stable Cardiovascular status: stable Postop Assessment: no apparent nausea or vomiting Anesthetic complications: no    Last Vitals:  Vitals:   02/06/17 1306 02/06/17 1308  BP:    Pulse: 61 63  Resp: 19 15  Temp: 36.6 C   SpO2: 93% 93%    Last Pain:  Vitals:   02/06/17 1306  TempSrc:   PainSc: 0-No pain                 Kayana Thoen DANIEL

## 2017-02-07 ENCOUNTER — Encounter (HOSPITAL_COMMUNITY): Payer: Self-pay | Admitting: Radiology

## 2017-03-31 ENCOUNTER — Telehealth (HOSPITAL_COMMUNITY): Payer: Self-pay

## 2017-05-15 IMAGING — MR MR THORACIC SPINE W/ CM
6 of 8 series · 34 of 48 positions shown · IV contrast (multihance)
Comparison: 08/12/2014 cervical and thoracic MRI.

CLINICAL DATA: Patient with history of prostate cancer. Acute onset
of pain radiating from rectum to penis. On waking the next day,
patient had numbness from waist down.

EXAM:
MRI THORACIC SPINE WITH CONTRAST
TECHNIQUE: Multiplanar and multiecho pulse sequences of the thoracic spine were
obtained with intravenous contrast.
CONTRAST:  20mL MULTIHANCE GADOBENATE DIMEGLUMINE 529 MG/ML IV SOLN

[Series 5: T1 · axial · non-contrast · 3.0mm · 0.78mm/px · z∈[-205,+16]mm · 8 of 53 slices shown (1 of 3)]
[im 1/53]
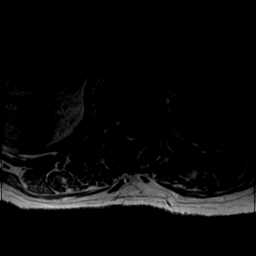
[im 9/53]
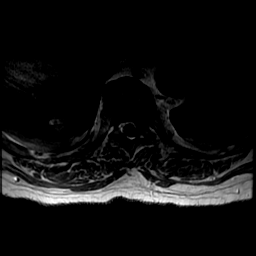
[im 17/53]
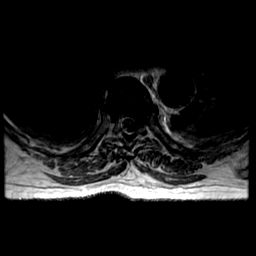
[im 25/53]
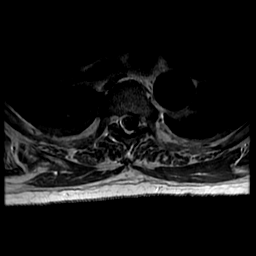
[im 29/53]
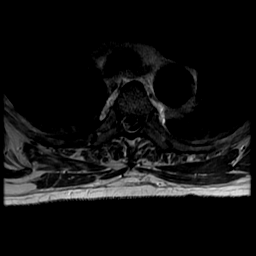
[im 37/53]
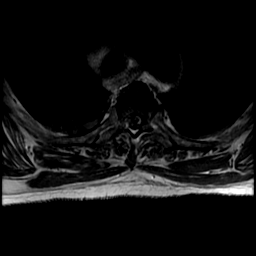
[im 45/53]
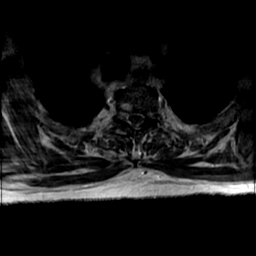
[im 53/53]
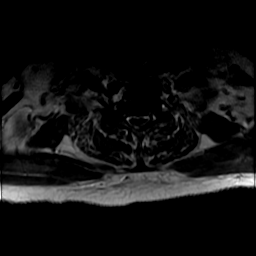

[Series 6: T1 · axial · non-contrast · 3.0mm · 0.78mm/px · z∈[-82,+7]mm · 7 of 27 slices shown (2 of 3)]
[im 1/27]
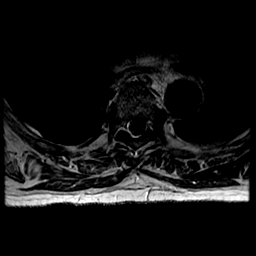
[im 5/27]
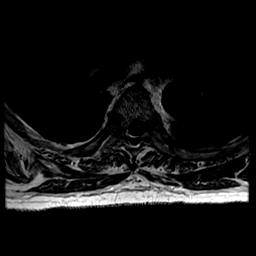
[im 9/27]
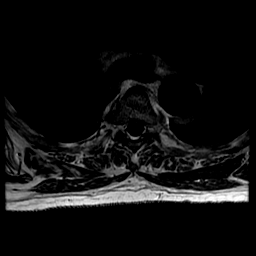
[im 14/27]
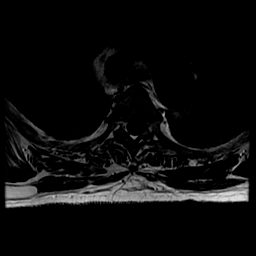
[im 18/27]
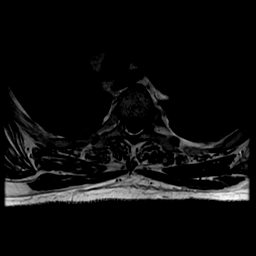
[im 22/27]
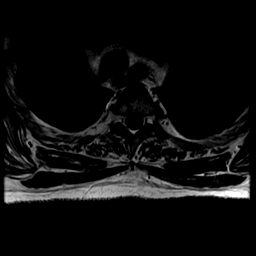
[im 27/27]
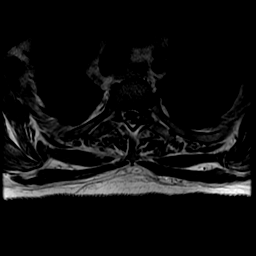

[Series 8: T1 post-contrast · axial · 3.0mm · 0.78mm/px · z∈[-82,+7]mm · 7 of 27 slices shown]
[im 1/27]
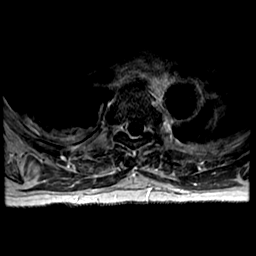
[im 5/27]
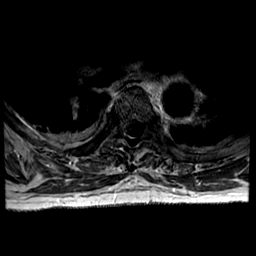
[im 9/27]
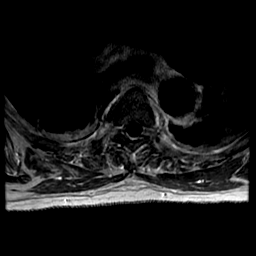
[im 14/27]
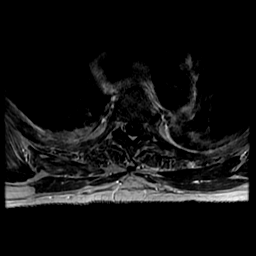
[im 18/27]
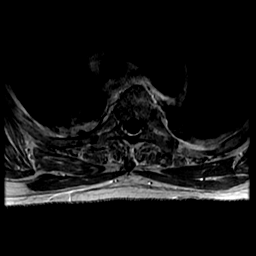
[im 22/27]
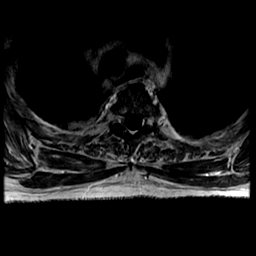
[im 27/27]
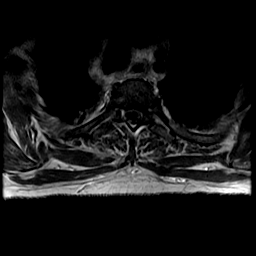

[Series 200: T1 · sagittal · 3.0mm · 0.90mm/px · 4 of 14 slices shown (3 of 3)]
[im 1/14]
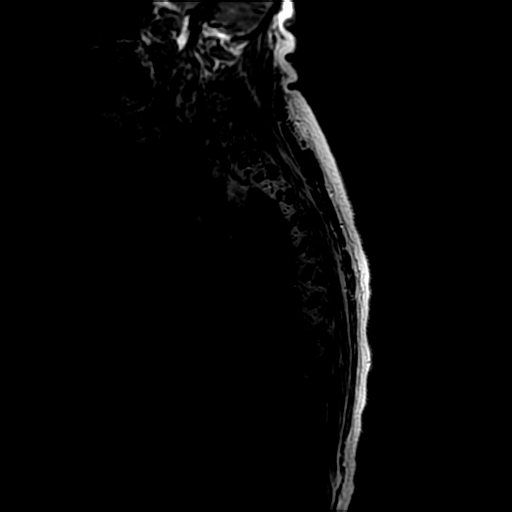
[im 5/14]
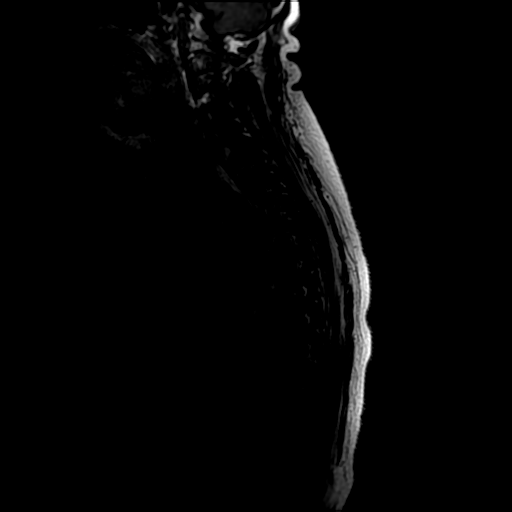
[im 9/14]
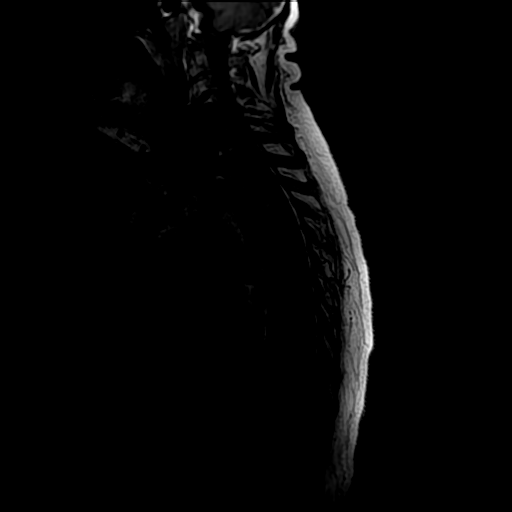
[im 14/14]
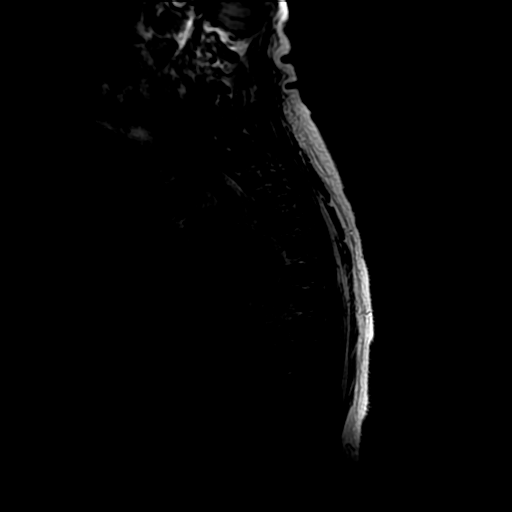

[Series 300: T2 · sagittal · 3.0mm · 0.66mm/px · 4 of 14 slices shown]
[im 1/14]
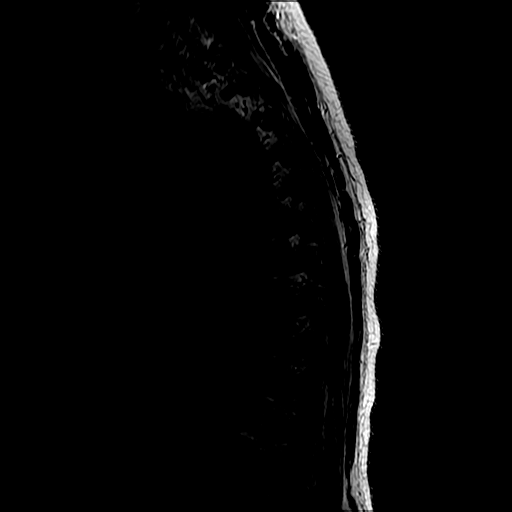
[im 5/14]
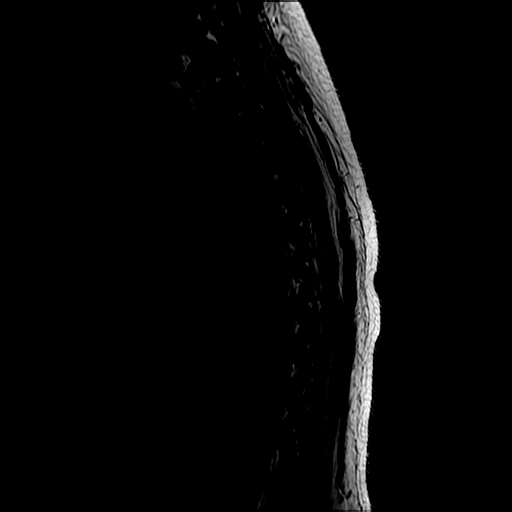
[im 9/14]
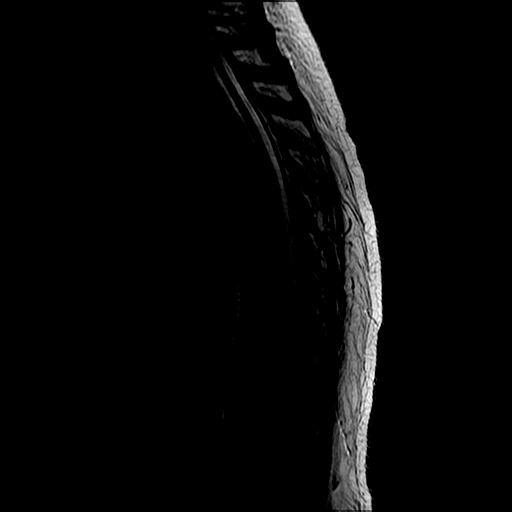
[im 14/14]
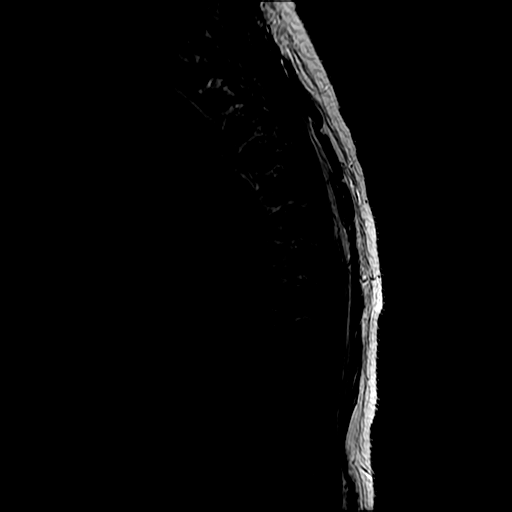

[Series 700: T1 fat-sat post-contrast · sagittal · 3.0mm · 0.66mm/px · 4 of 14 slices shown]
[im 1/14]
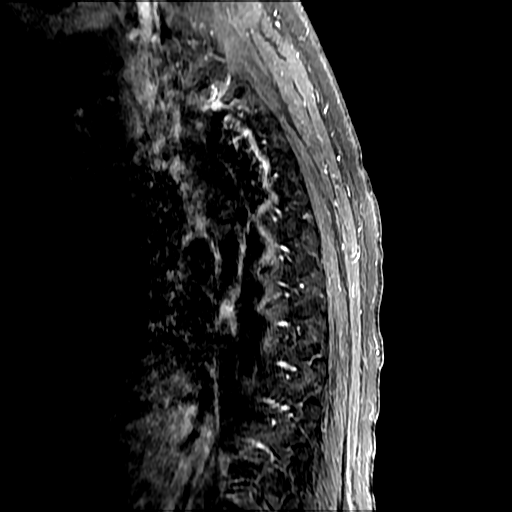
[im 5/14]
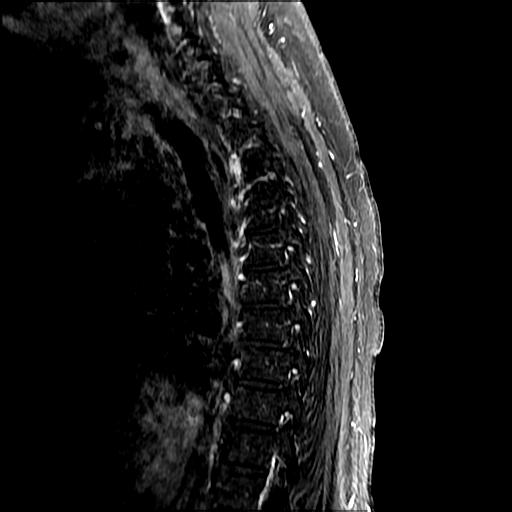
[im 9/14]
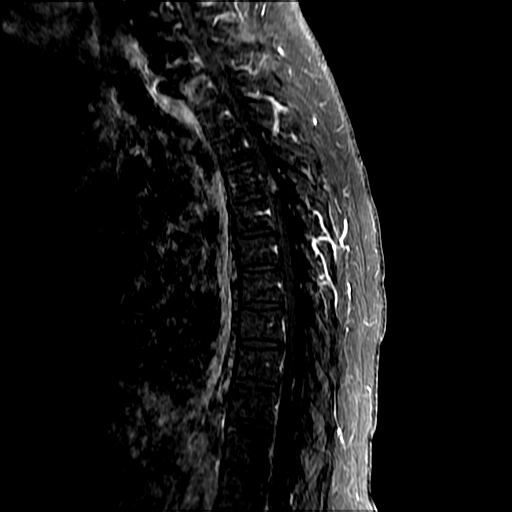
[im 14/14]
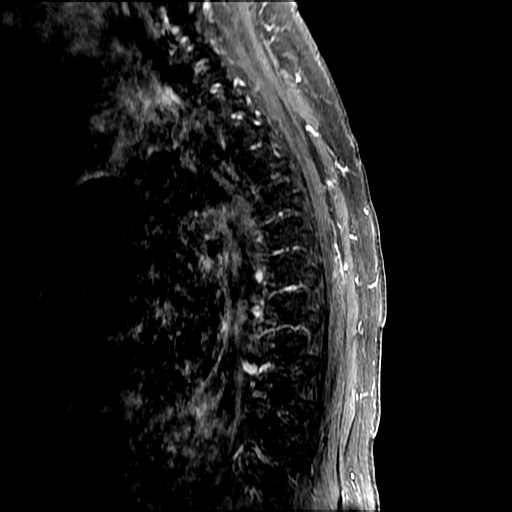

[34 of 48 positions shown; findings below may reference images not displayed]

FINDINGS: This study compliments the previous cervical and thoracic MRI.
Counting was performed from the craniocervical junction. C4 through
C7 ACDF. Bone marrow signal shows heterogenous marrow. This is a
nonspecific finding most commonly associated with obesity, anemia,
cigarette smoking or chronic disease. No enhancing aggressive
osseous lesions are present. There is enhancement of the T1 superior
endplate which is compatible with adjacent segment disease from the
relatively long segment cervical fusion above. No findings to
suggest bony metastatic disease.

Thoracic cord is swollen with increased intramedullary signal
extending from T3-T4 through the mid T5 vertebra. This area
demonstrates post gadolinium enhancement. No intramedullary
hematoma. There is increased diffusion signal on diffusion-weighted
imaging which could be associated with either infarct or transverse
myelitis. No evidence of AVM. On the axial images, the cord
enhancement is central in the lesion. No thoracic spinal stenosis.
IMPRESSION: Thoracic cord intramedullary lesion extending from T3-T4 through T5
vertebra. Central enhancement after gadolinium administration with
probable restricted diffusion. The differential considerations are
unchanged and this pre and post gadolinium study does not establish
a conclusive diagnosis. Most likely differential considerations are
cord infarct versus transverse myelitis. Given these differential
considerations, the clinical presentation would probably be more
helpful in narrowing the differential diagnosis.

## 2017-05-15 IMAGING — CT CT ANGIO ABDOMEN
2 of 9 series · 14 of 46 positions shown, 16 images · IV contrast (omnipaque)
Comparison: None.

CLINICAL DATA: Patient with bilateral lower extremity pain and
weakness. Evaluate for aortic aneurysm or dissection.

EXAM:
CT ANGIOGRAPHY CHEST AND ABDOMEN
TECHNIQUE: Multidetector CT imaging of the chest and abdomen was performed
using the standard protocol during bolus administration of
intravenous contrast. Multiplanar CT image reconstructions and MIPs
were obtained to evaluate the vascular anatomy.
CONTRAST:  100mL OMNIPAQUE IOHEXOL 350 MG/ML SOLN

[Series 5: dissection 2.0 i30f 1 · axial · 0.92mm/px · z∈[+886,+1310]mm · 11 of 244 slices shown, 13 images]
[im 16/244  soft-tissue]
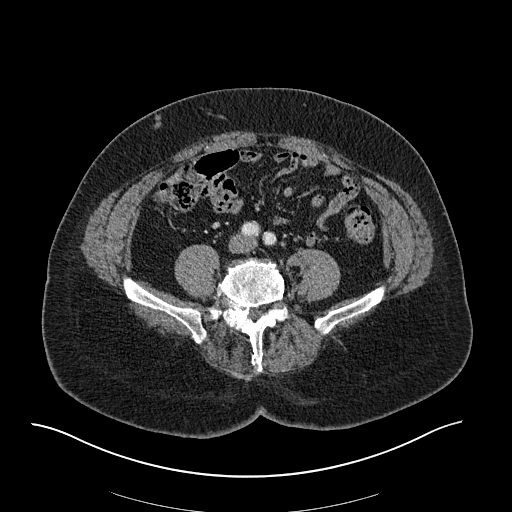
[im 16/244  bone]
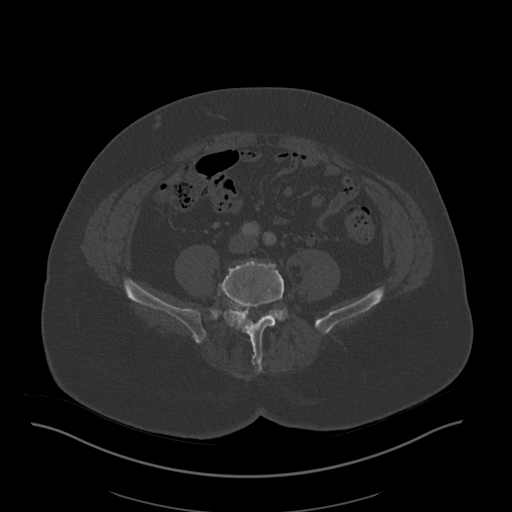
[im 46/244  soft-tissue]
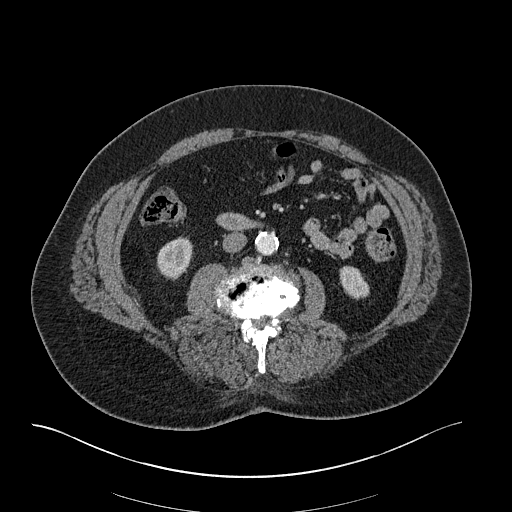
[im 61/244  soft-tissue]
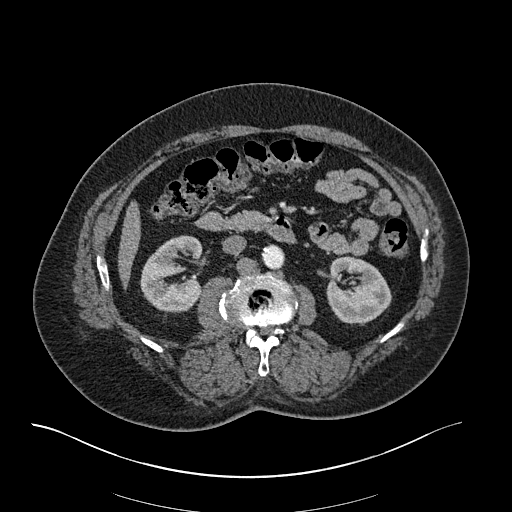
[im 76/244  soft-tissue]
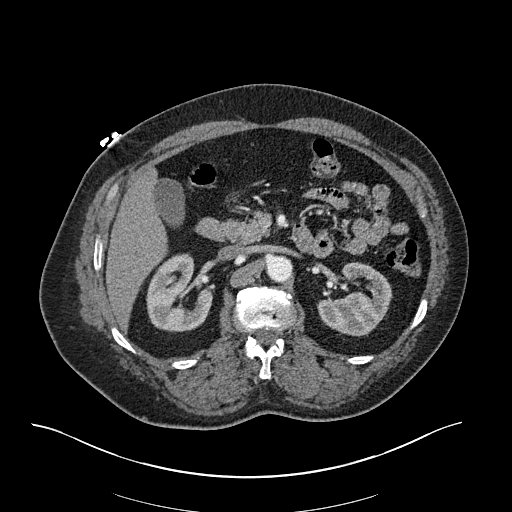
[im 107/244  soft-tissue]
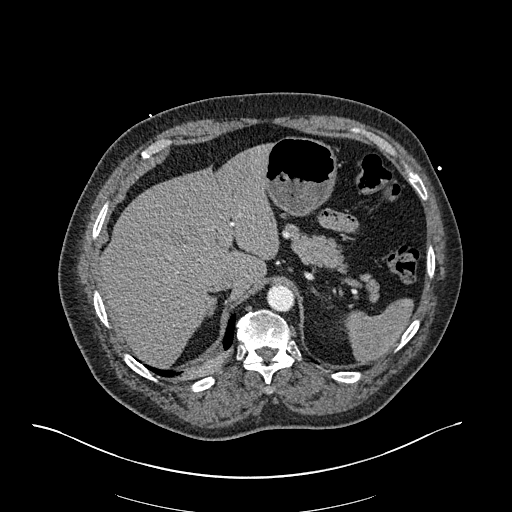
[im 122/244  soft-tissue]
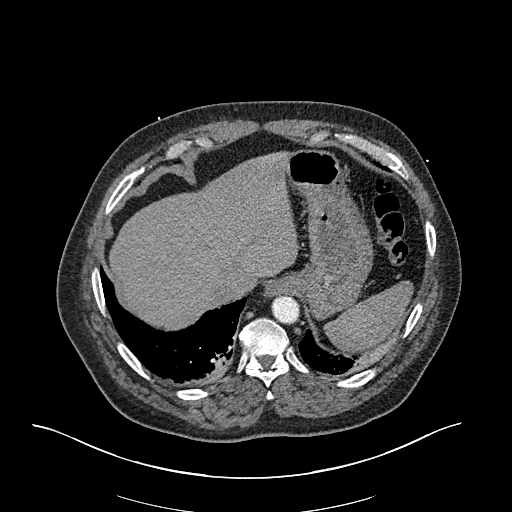
[im 137/244  soft-tissue]
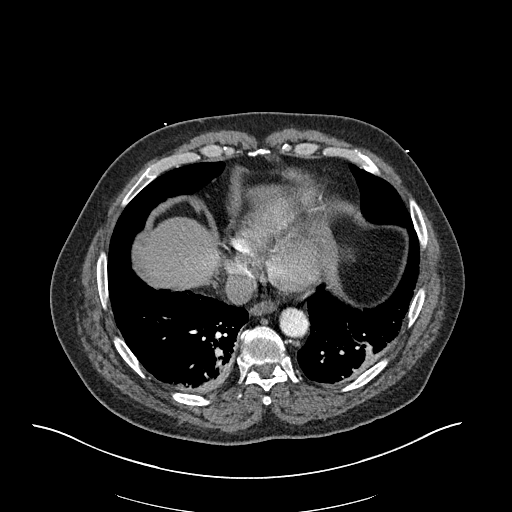
[im 168/244  soft-tissue]
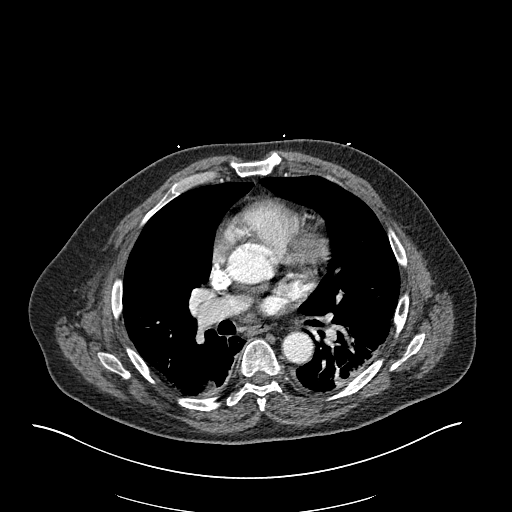
[im 183/244  soft-tissue]
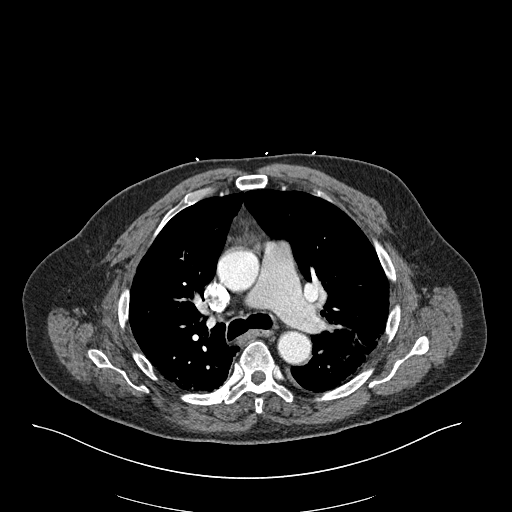
[im 183/244  bone]
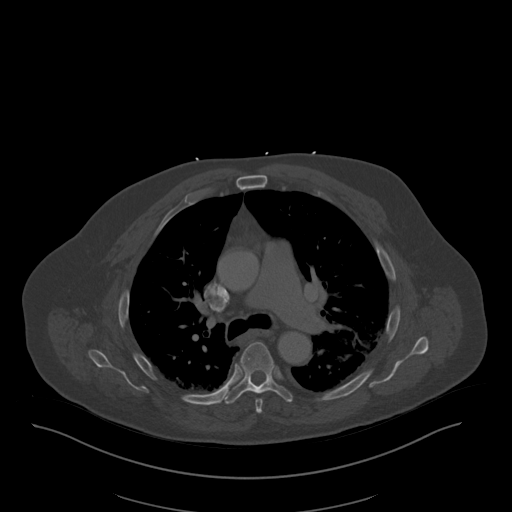
[im 198/244  soft-tissue]
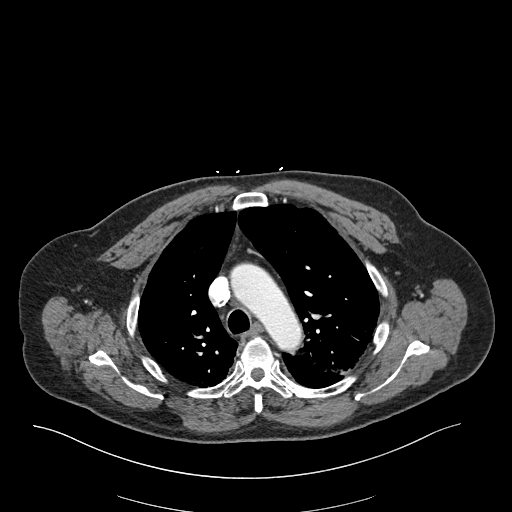
[im 228/244  soft-tissue]
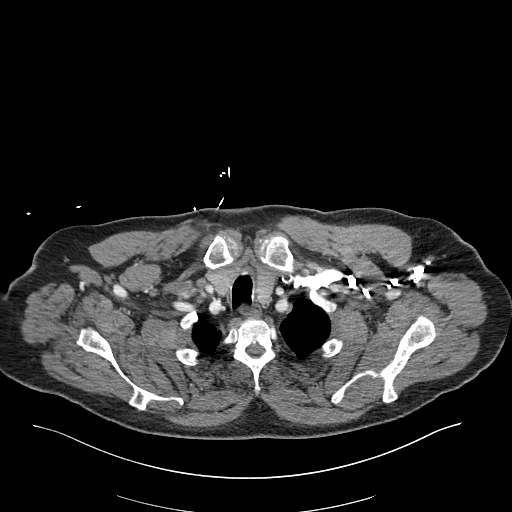

[Series 7: coronal mpr · coronal · 0.71mm/px · 3 of 163 slices shown]
[im 41/163  soft-tissue]
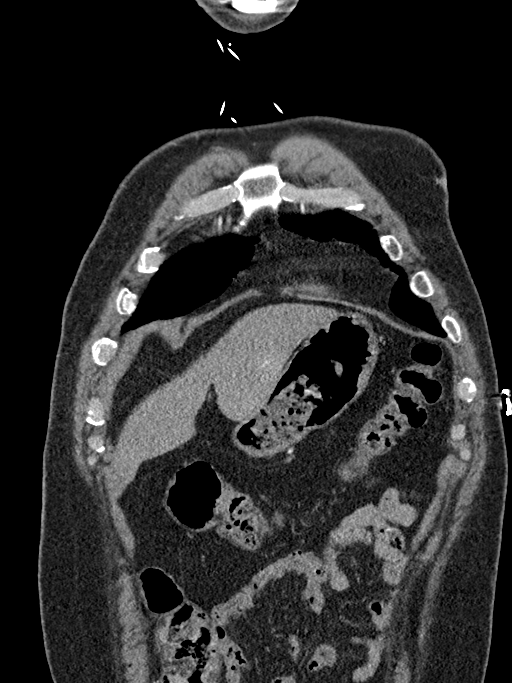
[im 82/163  soft-tissue]
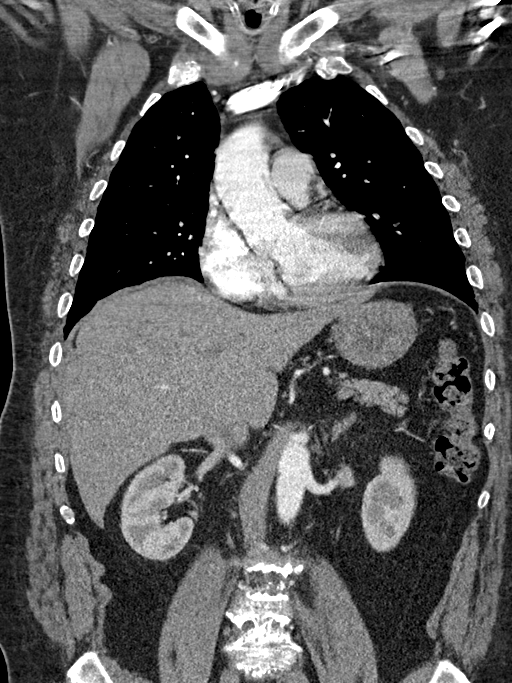
[im 122/163  soft-tissue]
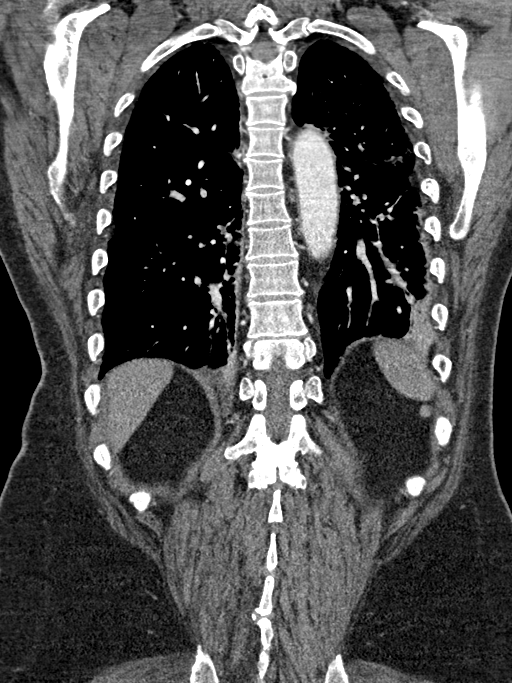

[14 of 46 positions shown; findings below may reference images not displayed]

FINDINGS: CTA CHEST FINDINGS

Mediastinum/Nodes: Noncontrast images through the chest demonstrate
no high attenuation within the aorta to suggest intramural
hemorrhage. Postcontrast images demonstrate no evidence for thoracic
aortic dissection. Evaluation of the aortic root is limited due to
motion artifact. The ascending thoracic aorta measures 3.7 cm. The
take off of the great vessels are patent. No axillary, mediastinal
or hilar lymphadenopathy.

Lungs/Pleura: Central airways are patent. Dependent consolidative
opacities within the bilateral lower lobes. Additionally there is
bilateral subpleural architectural distortion and reticulation with
associated ground-glass. No pleural effusion or pneumothorax. There
is suggestion of a 1.3 cm subpleural left lower lobe nodule (image
71; series 5).

Review of the MIP images confirms the above findings.

CTA ABDOMEN FINDINGS

Hepatobiliary: Within the left hepatic lobe there is a 2.3 cm
peripheral enhancing lesion (image 131; series 5). Liver is normal
in size and contour. Gallbladder is unremarkable. No intrahepatic or
extrahepatic biliary ductal dilatation.

Pancreas: Unremarkable

Spleen: Unremarkable

Adrenals/Urinary Tract: Adrenal glands are normal. Within the
superior pole of the left kidney there is a 2.8 cm lesion within
internal density of 44 Hounsfield units.

Stomach/Bowel: No abnormal bowel wall thickening or evidence for
bowel obstruction. No free fluid or free intraperitoneal air.

Vascular/Lymphatic: The celiac axis and superior mesenteric arteries
are patent. Single bilateral renal arteries are patent. Peripheral
calcified atherosclerotic plaque of the abdominal aorta which is
normal in caliber. Inferior mesenteric artery is patent. No
retroperitoneal adenopathy.

Other: None

Musculoskeletal: Extensive lower lumbar spine degenerative changes.
No aggressive or acute appearing osseous lesions.

Review of the MIP images confirms the above findings.
IMPRESSION: No CT evidence for thoracic or abdominal aortic dissection. The
aortic root is limited in evaluation due to motion artifact.

There is an indeterminate lesion within the superior pole of the
left kidney which may potentially represent an enhancing solid
lesion. This needs dedicated evaluation with pre and post
contrast-enhanced imaging, preferentially MRI.

Nonspecific enhancing lesion within the left hepatic lobe. Recommend
definitive characterization with above recommended pre and post
contrast-enhanced MRI.

Suggestion of a 1.3 cm subpleural nodule left lower lobe. Recommend
follow-up chest CT in 6- 8 weeks to assess for interval change or
resolution as the pulmonary findings may potentially represent a
component of atelectasis and or infectious/inflammatory process.

The ascending thoracic aorta measures 3.7 cm. Recommend annual
imaging followup by CTA or MRA. This recommendation follows 9232
ACCF/AHA/AATS/ACR/ASA/SCA/NOMBUISELO/CHAI/NAFISA/IASC Guidelines for the
Diagnosis and Management of Patients with Thoracic Aortic Disease.
Circulation.9232; 121: [id number]

These results will be called to the ordering clinician or
representative by the Radiologist Assistant, and communication
documented in the PACS or zVision Dashboard.

## 2017-05-27 ENCOUNTER — Emergency Department (HOSPITAL_COMMUNITY)
Admission: EM | Admit: 2017-05-27 | Discharge: 2017-05-27 | Disposition: A | Discharge status: Home / Self Care | Payer: Medicare HMO | Attending: Emergency Medicine | Admitting: Emergency Medicine

## 2017-05-27 ENCOUNTER — Emergency Department (HOSPITAL_COMMUNITY): Payer: Medicare HMO

## 2017-05-27 ENCOUNTER — Encounter (HOSPITAL_COMMUNITY): Payer: Self-pay | Admitting: Emergency Medicine

## 2017-05-27 DIAGNOSIS — R0789 Other chest pain: Secondary | ICD-10-CM | POA: Diagnosis not present

## 2017-05-27 DIAGNOSIS — Z91010 Allergy to peanuts: Secondary | ICD-10-CM | POA: Insufficient documentation

## 2017-05-27 DIAGNOSIS — Z87891 Personal history of nicotine dependence: Secondary | ICD-10-CM | POA: Diagnosis not present

## 2017-05-27 DIAGNOSIS — C3490 Malignant neoplasm of unspecified part of unspecified bronchus or lung: Secondary | ICD-10-CM | POA: Insufficient documentation

## 2017-05-27 DIAGNOSIS — Z79899 Other long term (current) drug therapy: Secondary | ICD-10-CM | POA: Insufficient documentation

## 2017-05-27 DIAGNOSIS — R0602 Shortness of breath: Secondary | ICD-10-CM | POA: Diagnosis present

## 2017-05-27 DIAGNOSIS — J449 Chronic obstructive pulmonary disease, unspecified: Secondary | ICD-10-CM | POA: Insufficient documentation

## 2017-05-27 DIAGNOSIS — E8779 Other fluid overload: Secondary | ICD-10-CM | POA: Diagnosis not present

## 2017-05-27 DIAGNOSIS — R0600 Dyspnea, unspecified: Secondary | ICD-10-CM | POA: Diagnosis not present

## 2017-05-27 DIAGNOSIS — I10 Essential (primary) hypertension: Secondary | ICD-10-CM | POA: Diagnosis not present

## 2017-05-27 DIAGNOSIS — Z7901 Long term (current) use of anticoagulants: Secondary | ICD-10-CM | POA: Diagnosis not present

## 2017-05-27 DIAGNOSIS — Z8546 Personal history of malignant neoplasm of prostate: Secondary | ICD-10-CM | POA: Insufficient documentation

## 2017-05-27 LAB — BASIC METABOLIC PANEL
Anion gap: 7 (ref 5–15)
BUN: 14 mg/dL (ref 6–20)
CHLORIDE: 109 mmol/L (ref 101–111)
CO2: 25 mmol/L (ref 22–32)
CREATININE: 0.94 mg/dL (ref 0.61–1.24)
Calcium: 9.5 mg/dL (ref 8.9–10.3)
GFR calc non Af Amer: >60 mL/min (ref >60)
Glucose, Bld: 107 mg/dL — ABNORMAL HIGH (ref 65–99)
Potassium: 4.3 mmol/L (ref 3.5–5.1)
SODIUM: 141 mmol/L (ref 135–145)

## 2017-05-27 LAB — I-STAT TROPONIN, ED
Troponin i, poc: 0 ng/mL (ref 0.00–0.08)
Troponin i, poc: 0 ng/mL (ref 0.00–0.08)

## 2017-05-27 LAB — CBC
HEMATOCRIT: 42 % (ref 39.0–52.0)
Hemoglobin: 13.3 g/dL (ref 13.0–17.0)
MCH: 30 pg (ref 26.0–34.0)
MCHC: 31.7 g/dL (ref 30.0–36.0)
MCV: 94.8 fL (ref 78.0–100.0)
PLATELETS: 248 10*3/uL (ref 150–400)
RBC: 4.43 MIL/uL (ref 4.22–5.81)
RDW: 13.4 % (ref 11.5–15.5)
WBC: 3.4 10*3/uL — ABNORMAL LOW (ref 4.0–10.5)

## 2017-05-27 LAB — BRAIN NATRIURETIC PEPTIDE: B NATRIURETIC PEPTIDE 5: 11.4 pg/mL (ref 0.0–100.0)

## 2017-05-27 MED ORDER — FUROSEMIDE 20 MG PO TABS: 20.0000 mg | ORAL_TABLET | Freq: Every day | ORAL | 0 refills | Status: DC

## 2017-05-27 MED ORDER — FUROSEMIDE 10 MG/ML IJ SOLN
40.0000 mg | Freq: Once | INTRAMUSCULAR | Status: AC
  Administered 2017-05-27: 40 mg via INTRAVENOUS
  Filled 2017-05-27: qty 4

## 2017-05-27 NOTE — ED Notes (Signed)
This RN ambulated pt and pt maintained at 96-100%, but pt did have labored breathing once sitting down and endorsed SOB.

## 2017-05-27 NOTE — ED Provider Notes (Signed)
[facility] EMERGENCY DEPARTMENT Provider Note   CSN: [id number] Arrival date & time: 05/27/17  1424     History   Chief Complaint Chief Complaint  Patient presents with  . Chest Pain    HPI Jeremy Landry is a 72 y.o. male.  HPI 72 year old male with extensive past medical history as below including COPD, history of lung cancer on radiation, here with shortness of breath.  The patient states that he ran out of his fluid pills approximately 2 weeks ago.  Since then, he has had an 11 pound weight gain over the last 2 weeks.  He said associated cough and shortness of breath.  The shortness of breath was initially with exertion but is not rest.  It worsens when she lies flat.  He does report that he had some transient chest pain several days ago, this was sharp, intermittent, and lasted only 1 to 2 seconds.  It has not returned.  Denies any chest pain with exertion.  He has noticed some mild increase in his leg swelling.  He is noticed mild swelling in his abdomen as well.  No abdominal pain, nausea, or vomiting.  Denies any other medical complaints.  Denies any current chest pain or shortness of breath at rest.  Past Medical History:  Diagnosis Date  . Alcoholism ([place])   . Anxiety   . Chronic headaches   . Congestive heart failure ([place])   . COPD (chronic obstructive pulmonary disease) ([place])   . Depression   . GERD (gastroesophageal reflux disease)   . Glaucoma   . History of kidney stones   . Hypertension   . Kidney stones   . Lung cancer ([place])   . Pneumonia   . Prostate cancer [facility])     Patient Active Problem List   Diagnosis Date Noted  . Upper airway cough syndrome 01/23/2017  . DOE (dyspnea on exertion) 01/15/2017  . Lower extremity numbness   . Generalized weakness 08/13/2014  . Smoker 08/12/2014  . Essential hypertension 08/12/2014  . GERD (gastroesophageal reflux disease) 08/12/2014  . Numbness and tingling of both legs 08/11/2014    Past  Surgical History:  Procedure Laterality Date  . CERVICAL DISC SURGERY    . COLONOSCOPY WITH PROPOFOL N/A 09/05/2015   Procedure: COLONOSCOPY WITH PROPOFOL;  Surgeon: Doran Stabler, MD;  Location: [facility];  Service: Gastroenterology;  Laterality: N/A;  . LUMBAR DISC SURGERY     x 3  . RADIOLOGY WITH ANESTHESIA N/A 02/06/2017   Procedure: MRI OF THE BRAIN WITH AND WITHOUT CONTRAST;  Surgeon: Radiologist, Medication, MD;  Location: [facility];  Service: Radiology;  Laterality: N/A;        Home Medications    Prior to Admission medications   Medication Sig Start Date End Date Taking? Authorizing Provider  albuterol (PROAIR HFA) 108 (90 Base) MCG/ACT inhaler Inhale 2 puffs into the lungs every 6 (six) hours as needed for wheezing or shortness of breath.   Yes [provider]  clopidogrel (PLAVIX) 75 MG tablet Take 1 tablet (75 mg total) by mouth daily. 08/17/14  Yes Velvet Bathe, MD  finasteride (PROSCAR) 5 MG tablet Take 5 mg by mouth daily.   Yes [provider]  latanoprost (XALATAN) 0.005 % ophthalmic solution Place 1 drop into both eyes at bedtime.   Yes [provider]  nitroGLYCERIN (NITROSTAT) 0.4 MG SL tablet Place 0.4 mg under the tongue every 5 (five) minutes as needed for chest pain.  Yes [provider]  omeprazole (PRILOSEC) 20 MG capsule Take 20 mg by mouth daily.   Yes [provider]  OXYGEN Inhale 2 L/min into the lungs as needed.   Yes [provider]  prazosin (MINIPRESS) 1 MG capsule Take 1 mg by mouth at bedtime.   Yes [provider]  terazosin (HYTRIN) 5 MG capsule Take 5 mg by mouth at bedtime.   Yes [provider]  furosemide (LASIX) 20 MG tablet Take 1 tablet (20 mg total) by mouth daily for 14 days. 05/27/17 06/10/17  Duffy Bruce, MD  Olodaterol HCl 2.5 MCG/ACT AERS Inhale 2 puffs into the lungs daily.    [provider]    Family History Family History  Problem Relation Age  of Onset  . Bone cancer Brother        in leg  . Colon cancer Maternal Uncle     Social History Social History   Tobacco Use  . Smoking status: Former Smoker    Packs/day: 1.00    Years: 40.00    Pack years: 40.00    Last attempt to quit: 07/23/2014    Years since quitting: 2.8  . Smokeless tobacco: Never Used  Substance Use Topics  . Alcohol use: Yes    Alcohol/week: 0.0 oz    Comment: ocassional beer once or twice a week  . Drug use: No    Comment: former cocaine and marijuana use sober 3 years     Allergies   Aspirin; Shellfish allergy; Other; Peanut-containing drug products; and Lisinopril   Review of Systems Review of Systems  Constitutional: Positive for fatigue.  Respiratory: Positive for shortness of breath.   Cardiovascular: Positive for leg swelling.  Gastrointestinal: Positive for abdominal distention.  Neurological: Positive for weakness.  All other systems reviewed and are negative.    Physical Exam Updated Vital Signs BP (!) 147/88   Pulse 67   Temp 97.6 F (36.4 C) (Oral)   Resp (!) 25   SpO2 94%   Physical Exam  Constitutional: He is oriented to person, place, and time. He appears well-developed and well-nourished. No distress.  HENT:  Head: Normocephalic and atraumatic.  Eyes: Conjunctivae are normal.  Neck: Neck supple. JVD present.  Cardiovascular: Normal rate, regular rhythm and normal heart sounds. Exam reveals no friction rub.  No murmur heard. Pulmonary/Chest: Effort normal and breath sounds normal. No respiratory distress. He has no wheezes. He has no rales.  Abdominal: He exhibits no distension.  Musculoskeletal:       Right lower leg: He exhibits edema.  Neurological: He is alert and oriented to person, place, and time. He exhibits normal muscle tone.  Skin: Skin is warm. Capillary refill takes less than 2 seconds.  Psychiatric: He has a normal mood and affect.  Nursing note and vitals reviewed.    ED Treatments / Results    Labs (all labs ordered are listed, but only abnormal results are displayed) Labs Reviewed  BASIC METABOLIC PANEL - Abnormal; Notable for the following components:      Result Value   Glucose, Bld 107 (*)    All other components within normal limits  CBC - Abnormal; Notable for the following components:   WBC 3.4 (*)    All other components within normal limits  BRAIN NATRIURETIC PEPTIDE  I-STAT TROPONIN, ED  I-STAT TROPONIN, ED    EKG EKG Interpretation  Date/Time:  Tuesday May 27 2017 14:31:39 EDT Ventricular Rate:  73 PR Interval:  174  QRS Duration: 92 QT Interval:  400 QTC Calculation: 440 R Axis:   16 Text Interpretation:  Normal sinus rhythm Normal ECG No significant change since last tracing Confirmed by Duffy Bruce [phone]) on 05/27/2017 10:48:49 PM   Radiology Dg Chest 2 View  Result Date: 05/27/2017 CLINICAL DATA:  Intermittent chest pain and chronic dyspnea. EXAM: CHEST - 2 VIEW COMPARISON:  10/21/2014 FINDINGS: The heart size and mediastinal contours are within normal limits. Minimal aortic atherosclerosis at the arch without aneurysm. Subsegmental atelectasis and/or scarring at the lung bases, right greater than left. ACDF of the included lower cervical spine without complicating features. IMPRESSION: Bibasilar subsegmental atelectasis and/or scarring. No active pulmonary disease. Electronically Signed   By: Ashley Royalty M.D.   On: 05/27/2017 15:11    Procedures Procedures (including critical care time)  Medications Ordered in ED Medications  furosemide (LASIX) injection 40 mg (40 mg Intravenous Given 05/27/17 2012)     Initial Impression / Assessment and Plan / ED Course  I have reviewed the triage vital signs and the nursing notes.  Pertinent labs & imaging results that were available during my care of the patient were reviewed by me and considered in my medical decision making (see chart for details).     72 year old male with extensive past medical  history as above including CHF here with intermittent shortness of breath and orthopnea in the setting of stopping his Lasix for 2 weeks.  He has gained 11 pounds.  He does appear overtly hypervolemic on exam, although BNP is normal.  Chest x-ray shows bibasilar atelectasis.  He does have bibasilar rales on exam.  EKG nonischemic.  Troponin negative x2 and he has no chest pain currently.  I suspect his symptoms are secondary to hypervolemia in the setting of Lasix nonadherence. He was given lasix here and feels much improved. Ambulatory without hypoxia. Discussed labs, symptoms with pt in detail. He is adamant he has no CP currently and is requesting discharge home. Given reassuring labs, vitals, and serial exams, feel this is reasonable. Will place him back on his lasix, advise him to continue his other meds, and follow-up with PCP. Advised him to return if he develops any chest pain.  Final Clinical Impressions(s) / ED Diagnoses   Final diagnoses:  Other hypervolemia  Dyspnea, unspecified type    ED Discharge Orders        Ordered    furosemide (LASIX) 20 MG tablet  Daily     05/27/17 2247       Duffy Bruce, MD 05/28/17 [phone]

## 2017-05-27 NOTE — Discharge Instructions (Signed)
I suspect your symptoms today are due to fluid overload. Start taking the fluid pill I've prescribed.  Call your doctor to follow-up in 3-5 days.  If your chest pain returns, return to the ER

## 2017-05-27 NOTE — ED Triage Notes (Addendum)
Pt reports lung cancer, finished radiation 2 weeks ago. Has had intermittent chest pain with fluid retention to whole body and gained 11 lbs.  No chest pain currently but feels short of breath at all times.

## 2017-06-21 ENCOUNTER — Encounter (HOSPITAL_COMMUNITY): Payer: Self-pay | Admitting: *Deleted

## 2017-06-21 ENCOUNTER — Other Ambulatory Visit: Payer: Self-pay

## 2017-06-21 ENCOUNTER — Emergency Department (HOSPITAL_COMMUNITY)
Admission: EM | Admit: 2017-06-21 | Discharge: 2017-06-22 | Disposition: A | Discharge status: Home / Self Care | Payer: Medicare HMO | Attending: Emergency Medicine | Admitting: Emergency Medicine

## 2017-06-21 DIAGNOSIS — Z7901 Long term (current) use of anticoagulants: Secondary | ICD-10-CM | POA: Diagnosis not present

## 2017-06-21 DIAGNOSIS — G243 Spasmodic torticollis: Secondary | ICD-10-CM | POA: Insufficient documentation

## 2017-06-21 DIAGNOSIS — Z79899 Other long term (current) drug therapy: Secondary | ICD-10-CM | POA: Insufficient documentation

## 2017-06-21 DIAGNOSIS — I110 Hypertensive heart disease with heart failure: Secondary | ICD-10-CM | POA: Insufficient documentation

## 2017-06-21 DIAGNOSIS — M436 Torticollis: Secondary | ICD-10-CM | POA: Diagnosis present

## 2017-06-21 DIAGNOSIS — J449 Chronic obstructive pulmonary disease, unspecified: Secondary | ICD-10-CM | POA: Diagnosis not present

## 2017-06-21 DIAGNOSIS — I509 Heart failure, unspecified: Secondary | ICD-10-CM | POA: Diagnosis not present

## 2017-06-21 DIAGNOSIS — Z87891 Personal history of nicotine dependence: Secondary | ICD-10-CM | POA: Diagnosis not present

## 2017-06-21 LAB — CBC WITH DIFFERENTIAL/PLATELET
ABS IMMATURE GRANULOCYTES: 0 10*3/uL (ref 0.0–0.1)
BASOS PCT: 0 %
Basophils Absolute: 0 10*3/uL (ref 0.0–0.1)
Eosinophils Absolute: 0 10*3/uL (ref 0.0–0.7)
Eosinophils Relative: 0 %
HCT: 39.1 % (ref 39.0–52.0)
HEMOGLOBIN: 12.2 g/dL — AB (ref 13.0–17.0)
IMMATURE GRANULOCYTES: 0 %
LYMPHS ABS: 0.7 10*3/uL (ref 0.7–4.0)
LYMPHS PCT: 10 %
MCH: 29.4 pg (ref 26.0–34.0)
MCHC: 31.2 g/dL (ref 30.0–36.0)
MCV: 94.2 fL (ref 78.0–100.0)
MONOS PCT: 6 %
Monocytes Absolute: 0.5 10*3/uL (ref 0.1–1.0)
NEUTROS ABS: 5.9 10*3/uL (ref 1.7–7.7)
Neutrophils Relative %: 84 %
PLATELETS: 226 10*3/uL (ref 150–400)
RBC: 4.15 MIL/uL — ABNORMAL LOW (ref 4.22–5.81)
RDW: 12.7 % (ref 11.5–15.5)
WBC: 7.1 10*3/uL (ref 4.0–10.5)

## 2017-06-21 MED ORDER — MORPHINE SULFATE (PF) 4 MG/ML IV SOLN
4.0000 mg | Freq: Once | INTRAVENOUS | Status: AC
  Administered 2017-06-22: 4 mg via INTRAVENOUS
  Filled 2017-06-21: qty 1

## 2017-06-21 MED ORDER — MORPHINE SULFATE (PF) 4 MG/ML IV SOLN
4.0000 mg | Freq: Once | INTRAVENOUS | Status: AC
  Administered 2017-06-21: 4 mg via INTRAVENOUS
  Filled 2017-06-21: qty 1

## 2017-06-21 MED ORDER — ACETAMINOPHEN 325 MG PO TABS
650.0000 mg | ORAL_TABLET | Freq: Once | ORAL | Status: AC
  Administered 2017-06-21: 650 mg via ORAL
  Filled 2017-06-21: qty 2

## 2017-06-21 MED ORDER — DIAZEPAM 5 MG PO TABS
5.0000 mg | ORAL_TABLET | Freq: Once | ORAL | Status: AC
  Administered 2017-06-21: 5 mg via ORAL
  Filled 2017-06-21: qty 1

## 2017-06-21 NOTE — ED Provider Notes (Signed)
[place] EMERGENCY DEPARTMENT Provider Note   CSN: [id number] Arrival date & time: 06/21/17  2021     History   Chief Complaint Chief Complaint  Patient presents with  . Torticollis    HPI Jeremy Landry is a 72 y.o. male.  He is complaining of severe right-sided neck pain that was there when he woke up this morning at 6 AM.  It is constant but he also gets intermittent jolts and spasms.  It is associated with limitations and rotating his neck.  He says it was mild and felt like he had a crick in his neck when he woke up but since that has been getting more severe.  He has not tried anything at home for it.  Here in triage they gave him some Tylenol and ice pack with no change in his symptoms.  He had a recent ED visit with shortness of breath after being off of his medications for a few weeks.  He ultimately was discharged.  He has extensive history of CHF COPD lung and prostate cancer.  Patient denies any numbness or tingling in his arms or legs.  No spasm in his neck causes his right arm to not be as useful because when he tries to use his arm makes his neck hurt worse.  The history is provided by the patient.  Shoulder Pain   This is a new problem. Episode onset: 6am. The problem occurs constantly. The problem has not changed since onset.The pain is present in the neck and right shoulder. The quality of the pain is described as sharp. The pain is at a severity of 10/10. Associated symptoms include limited range of motion and stiffness. Pertinent negatives include no numbness. The symptoms are aggravated by activity. He has tried cold for the symptoms. The treatment provided mild relief. There has been no history of extremity trauma.    Past Medical History:  Diagnosis Date  . Alcoholism ([place])   . Anxiety   . Chronic headaches   . Congestive heart failure ([place])   . COPD (chronic obstructive pulmonary disease) ([place])   . Depression   . GERD (gastroesophageal reflux  disease)   . Glaucoma   . History of kidney stones   . Hypertension   . Kidney stones   . Lung cancer ([employer])   . Pneumonia   . Prostate cancer [facility])     Patient Active Problem List   Diagnosis Date Noted  . Upper airway cough syndrome 01/23/2017  . DOE (dyspnea on exertion) 01/15/2017  . Lower extremity numbness   . Generalized weakness 08/13/2014  . Smoker 08/12/2014  . Essential hypertension 08/12/2014  . GERD (gastroesophageal reflux disease) 08/12/2014  . Numbness and tingling of both legs 08/11/2014    Past Surgical History:  Procedure Laterality Date  . CERVICAL DISC SURGERY    . COLONOSCOPY WITH PROPOFOL N/A 09/05/2015   Procedure: COLONOSCOPY WITH PROPOFOL;  Surgeon: Doran Stabler, MD;  Location: [facility];  Service: Gastroenterology;  Laterality: N/A;  . LUMBAR DISC SURGERY     x 3  . RADIOLOGY WITH ANESTHESIA N/A 02/06/2017   Procedure: MRI OF THE BRAIN WITH AND WITHOUT CONTRAST;  Surgeon: Radiologist, Medication, MD;  Location: [place];  Service: Radiology;  Laterality: N/A;        Home Medications    Prior to Admission medications   Medication Sig Start Date End Date Taking? Authorizing Provider  albuterol (PROAIR HFA) 108 (90 Base) MCG/ACT inhaler  Inhale 2 puffs into the lungs every 6 (six) hours as needed for wheezing or shortness of breath.    [provider]  clopidogrel (PLAVIX) 75 MG tablet Take 1 tablet (75 mg total) by mouth daily. 08/17/14   Velvet Bathe, MD  finasteride (PROSCAR) 5 MG tablet Take 5 mg by mouth daily.    [provider]  furosemide (LASIX) 20 MG tablet Take 1 tablet (20 mg total) by mouth daily for 14 days. 05/27/17 06/10/17  Duffy Bruce, MD  latanoprost (XALATAN) 0.005 % ophthalmic solution Place 1 drop into both eyes at bedtime.    [provider]  nitroGLYCERIN (NITROSTAT) 0.4 MG SL tablet Place 0.4 mg under the tongue every 5 (five) minutes as needed for chest pain.    [provider]    Olodaterol HCl 2.5 MCG/ACT AERS Inhale 2 puffs into the lungs daily.    [provider]  omeprazole (PRILOSEC) 20 MG capsule Take 20 mg by mouth daily.    [provider]  OXYGEN Inhale 2 L/min into the lungs as needed.    [provider]  prazosin (MINIPRESS) 1 MG capsule Take 1 mg by mouth at bedtime.    [provider]  terazosin (HYTRIN) 5 MG capsule Take 5 mg by mouth at bedtime.    [provider]    Family History Family History  Problem Relation Age of Onset  . Bone cancer Brother        in leg  . Colon cancer Maternal Uncle     Social History Social History   Tobacco Use  . Smoking status: Former Smoker    Packs/day: 1.00    Years: 40.00    Pack years: 40.00    Last attempt to quit: 07/23/2014    Years since quitting: 2.9  . Smokeless tobacco: Never Used  Substance Use Topics  . Alcohol use: Yes    Alcohol/week: 0.0 oz    Comment: ocassional beer once or twice a week  . Drug use: No    Comment: former cocaine and marijuana use sober 3 years     Allergies   Aspirin; Shellfish allergy; Other; Peanut-containing drug products; and Lisinopril   Review of Systems Review of Systems  Constitutional: Negative for chills and fever.  HENT: Negative for sore throat.   Eyes: Negative for visual disturbance.  Respiratory: Negative for shortness of breath.   Cardiovascular: Negative for chest pain.  Gastrointestinal: Negative for abdominal pain.  Genitourinary: Negative for dysuria.  Musculoskeletal: Positive for neck pain and stiffness. Negative for back pain.  Skin: Negative for rash.  Neurological: Negative for numbness.     Physical Exam Updated Vital Signs BP (!) 148/109   Pulse 92   Temp 98.4 F (36.9 C)   Ht 6\' 2"  (1.88 m)   Wt 126.6 kg (279 lb)   SpO2 94%   BMI 35.82 kg/m   Physical Exam  Constitutional: He appears well-developed and well-nourished.  HENT:  Head: Normocephalic and atraumatic.  Eyes:  Conjunctivae are normal.  Neck: Neck supple.  Cardiovascular: Normal rate, regular rhythm, normal heart sounds and intact distal pulses.  Pulmonary/Chest: Effort normal and breath sounds normal. He has no wheezes.  Abdominal: Soft. He exhibits no mass. There is no tenderness. There is no guarding.  Musculoskeletal: He exhibits tenderness.  He has some tenderness and spasm in his right trapezius.  It is associated with limited range of motion of the shoulder as any kind of movement of his  neck or shoulder causes him to go into spasm and stop.  He is got good distal strength in his hand grip hand is normal sensation.  Radial pulse 2+.  Other extremities full range of motion without any pain or tenderness.  Neurological: He is alert. GCS eye subscore is 4. GCS verbal subscore is 5. GCS motor subscore is 6.  Skin: Skin is warm and dry.  Psychiatric: He has a normal mood and affect.  Nursing note and vitals reviewed.    ED Treatments / Results  Labs (all labs ordered are listed, but only abnormal results are displayed) Labs Reviewed  BASIC METABOLIC PANEL - Abnormal; Notable for the following components:      Result Value   Glucose, Bld 116 (*)    All other components within normal limits  CBC WITH DIFFERENTIAL/PLATELET - Abnormal; Notable for the following components:   RBC 4.15 (*)    Hemoglobin 12.2 (*)    All other components within normal limits  TROPONIN I    EKG EKG Interpretation  Date/Time:  Saturday June 21 2017 23:05:49 EDT Ventricular Rate:  88 PR Interval:    QRS Duration: 103 QT Interval:  383 QTC Calculation: 464 R Axis:   25 Text Interpretation:  Sinus rhythm Confirmed by Aletta Edouard [phone]) on 06/21/2017 11:12:09 PM Also confirmed by Aletta Edouard [phone]), editor [address] Janett Billow [phone])  on 06/22/2017 9:59:11 AM   Radiology Ct Soft Tissue Neck W Contrast  Result Date: 06/22/2017 CLINICAL DATA:  RIGHT lateral neck pain. Pain radiates to RIGHT upper  extremity. History of cervical spine surgery. EXAM: CT NECK WITH CONTRAST TECHNIQUE: Multidetector CT imaging of the neck was performed using the standard protocol following the bolus administration of intravenous contrast. CONTRAST:  114mL OMNIPAQUE IOHEXOL 300 MG/ML  SOLN COMPARISON:  CT cervical spine December 14, 2007 FINDINGS: PHARYNX AND LARYNX: Narrowed pharynx, effaced epiglottis and airway. Apposition of the vocal cords. However, no inflammatory changes. Preservation of the parapharyngeal fat plane. SALIVARY GLANDS: Normal. THYROID: Normal. LYMPH NODES: No lymphadenopathy by CT size criteria. VASCULAR: Mild calcific atherosclerosis aortic arch, 2 vessel arch is a normal variant. Minimal calcific atherosclerosis LEFT carotid bifurcation. LIMITED INTRACRANIAL: Normal. VISUALIZED ORBITS: Normal. MASTOIDS AND VISUALIZED PARANASAL SINUSES: Mild lobulated paranasal sinus mucosal thickening. Mastoid air cells are well aerated. SKELETON: Nonacute. Multiple absent teeth. 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 Mild canal stenosis C4-5 through C6-7. Severe C3-4, RIGHT C4-5, bilateral C5-6 and bilateral C6-7, RIGHT C7-T1 neural foraminal narrowing. UPPER CHEST: Lung apices are clear. Centrilobular emphysema. No superior mediastinal lymphadenopathy. OTHER: None. IMPRESSION: 1. Effaced pharynx, potentially related to swallowing; recommend direct inspection. 2. 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 3. Mild canal stenosis C4-5 through C6-7. Severe C3-4 through C7-T1 neural foraminal narrowing. 4. Acute findings discussed with and reconfirmed by Dr.MESNER on 06/22/2017 at 2:19 am. Emphysema (ICD10-J43.9).  Aortic Atherosclerosis (ICD10-I70.0). Electronically Signed   By: Elon Alas M.D.   On: 06/22/2017 02:21    Procedures Procedures (including critical care time)  Medications Ordered in ED Medications  morphine 4 MG/ML injection 4 mg (has no administration in  time range)  diazepam (VALIUM) tablet 5 mg (has no administration in time range)  acetaminophen (TYLENOL) tablet 650 mg (650 mg Oral Given 06/21/17 2043)     Initial Impression / Assessment and Plan / ED Course  I have reviewed the triage vital signs and the nursing notes.  Pertinent labs & imaging results that were available  during my care of the patient were reviewed by me and considered in my medical decision making (see chart for details).  Clinical Course as of Jun 23 1029  Sat Jun 21, 2017  2223 Reevaluate-patient looks a little more comfortable although states he is still having severe pain.  He did tell me he is got some hardware in his neck so I think getting some imaging would be reasonable.  They are to order him another dose of pain medicine and a CT C-spine.   [MB]    Clinical Course User Index [MB] Hayden Rasmussen, MD     Final Clinical Impressions(s) / ED Diagnoses   Final diagnoses:  Spasmodic torticollis  Torticollis    ED Discharge Orders        Ordered    LORazepam (ATIVAN) 1 MG tablet  3 times daily PRN     06/22/17 0604       Hayden Rasmussen, MD 06/23/17 1032

## 2017-06-21 NOTE — ED Triage Notes (Signed)
The pt arrived by [place] from home  He is c/o rt sided neck pain since this am and down his rt arm .  He has not tried any type medicine at home  Hx of neck pain with spasms.  On home 02  Ice pack given  On home 02

## 2017-06-21 NOTE — ED Notes (Signed)
The pt stop c/o the neck pain as much since his  Family arrived.  Apparently he woke up this am with the neck pain then rode to [place] and back today  Family tried to get him to stay in [place] but he insisted on coming home.

## 2017-06-21 NOTE — Discharge Instructions (Signed)
You were evaluated in the emergency department for severe right-sided neck pain.  He had lab work and a CAT scan that did not show an obvious cause of your symptoms.  This is likely muscular and may take a few days to improve.  We will prescribe you some pain medicine in the meantime and you will need need to follow-up with your primary care doctor.

## 2017-06-22 ENCOUNTER — Emergency Department (HOSPITAL_COMMUNITY): Payer: Medicare HMO

## 2017-06-22 LAB — BASIC METABOLIC PANEL
Anion gap: 10 (ref 5–15)
BUN: 11 mg/dL (ref 6–20)
CALCIUM: 9.3 mg/dL (ref 8.9–10.3)
CHLORIDE: 104 mmol/L (ref 101–111)
CO2: 23 mmol/L (ref 22–32)
CREATININE: 0.86 mg/dL (ref 0.61–1.24)
GFR calc non Af Amer: >60 mL/min (ref >60)
Glucose, Bld: 116 mg/dL — ABNORMAL HIGH (ref 65–99)
Potassium: 4.2 mmol/L (ref 3.5–5.1)
SODIUM: 137 mmol/L (ref 135–145)

## 2017-06-22 LAB — TROPONIN I

## 2017-06-22 MED ORDER — DIPHENHYDRAMINE HCL 25 MG PO CAPS
25.0000 mg | ORAL_CAPSULE | Freq: Once | ORAL | Status: AC
  Administered 2017-06-22: 25 mg via ORAL
  Filled 2017-06-22: qty 1

## 2017-06-22 MED ORDER — LORAZEPAM 1 MG PO TABS: 0.5000 mg | ORAL_TABLET | Freq: Three times a day (TID) | ORAL | 0 refills | Status: DC | PRN

## 2017-06-22 MED ORDER — LORAZEPAM 2 MG/ML IJ SOLN
1.0000 mg | Freq: Once | INTRAMUSCULAR | Status: AC
  Administered 2017-06-22: 1 mg via INTRAVENOUS
  Filled 2017-06-22: qty 1

## 2017-06-22 MED ORDER — IOHEXOL 300 MG/ML  SOLN
100.0000 mL | Freq: Once | INTRAMUSCULAR | Status: AC | PRN
  Administered 2017-06-22: 100 mL via INTRAVENOUS

## 2017-06-22 NOTE — ED Provider Notes (Signed)
12:03 AM Assumed care from Dr. Melina Copa, please see their note for full history, physical and decision making until this point. In brief this is a 72 y.o. year old male who presented to the ED tonight with Torticollis     Intermittent right neck spasms. Pending ct 2/2 having hardware in place.   Ct ok. Symptoms worsening again (nurse states they are very intermittent in nature). Ativan/benadryl given. Patient sleeping without any torticollis. Stable for dc. Airway intact, CT scan showing no airway is likely during a phase of swallowing.   Discharge instructions, including strict return precautions for new or worsening symptoms, given. Patient and/or family verbalized understanding and agreement with the plan as described.   Labs, studies and imaging reviewed by myself and considered in medical decision making if ordered. Imaging interpreted by radiology.  Labs Reviewed  CBC WITH DIFFERENTIAL/PLATELET - Abnormal; Notable for the following components:      Result Value   RBC 4.15 (*)    Hemoglobin 12.2 (*)    All other components within normal limits  BASIC METABOLIC PANEL  TROPONIN I    CT Soft Tissue Neck W Contrast    (Results Pending)    No follow-ups on file.    Mattias Walmsley, Corene Cornea, MD 06/22/17 [phone]

## 2017-12-02 ENCOUNTER — Other Ambulatory Visit: Payer: Self-pay

## 2018-06-08 ENCOUNTER — Encounter (HOSPITAL_COMMUNITY): Payer: Self-pay | Admitting: *Deleted

## 2018-06-08 NOTE — Progress Notes (Signed)
Received referral for this veteran to participate in Pulmonary rehab at [facility] with the diagnosis of COPD.  Pt referred by Dr Mali Marion with [facility].  Pt authorization - [id number] authorization dates 04/07/18-08/05/18.  Reviewed pt follow up appt in the Pulmonary clinic on April 07, 2018 with  Called and spoke to [place] care coordinator at the [facility] regarding our continued closure to patients due to Covid-19.  Aware that other programs outside of the [facility] network may be scheduling new patients.  Community Care coordinator will notify this veteran and advise him of alternative options. Will create electronic referral and continue to hold  for now and schedule when permitted or informed pt wishes to go to another program. Maurice Small RN, BSN Cardiac and Pulmonary Rehab Nurse Navigator

## 2018-08-20 ENCOUNTER — Telehealth (HOSPITAL_COMMUNITY): Payer: Self-pay

## 2018-09-28 ENCOUNTER — Ambulatory Visit: Payer: Self-pay | Admitting: Surgery

## 2018-09-28 NOTE — H&P (Signed)
History of Present Illness Jeremy Landry. Jeremy Genter Landry; 09/28/2018 11:35 AM) The patient is a 73 year old male who presents with a complaint of Mass. Referred by Jeremy Folks, NP for left shoulder mass  This is a 73 year old male who presents with a two-year history of an enlarging mass on the lateral left shoulder. This has become fairly large and uncomfortable. He had a previous similar lipoma removed from the right side of his neck by Dr. Towanda Landry couple of years ago. He presents now to discuss excision of this new left shoulder lipoma. No imaging of this area.   Problem List/Past Medical Jeremy Key K. 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 LORazepam (1MG  Tablet, Oral) Active. Atorvastatin Calcium (40MG  Tablet, Oral) Active. Cyclobenzaprine HCl (5MG  Tablet, Oral) Active. Furosemide (20MG  Tablet, Oral) Active. Symbicort (160-4.5MCG/ACT Aerosol, Inhalation) Active. Striverdi Respimat (2.5MCG/ACT [employer], Training and development officer) Active. Omeprazole (20MG  Capsule ER, Oral) Active. Terazosin HCl (5MG  Tablet, Oral) Active. Prazosin HCl (1MG  Capsule, Oral) Active. Nitroglycerin (0.4MG  Tab Sublingual, Sublingual) Active. Clopidogrel Bisulfate (75MG  Tablet, Oral) Active. Medications Reconciled  Social History Jeremy Landry; 09/28/2018 9:23 AM) Alcohol use Remotely quit alcohol use. Caffeine use Carbonated beverages, Coffee, Tea. Illicit drug use Remotely quit drug use. Tobacco use Former smoker.  Family History Jeremy Landry; 09/28/2018 9:23  AM) Family history unknown First Degree Relatives  Other Problems Jeremy Landry. Jeremy Landry; 09/28/2018 11:31 AM) Alcohol Abuse Anxiety Disorder Arthritis Back Pain Bladder Problems Cerebrovascular Accident Chest pain Chronic Obstructive Lung Disease Depression Enlarged Prostate Gastroesophageal Reflux Disease High blood pressure Home Oxygen Use Hypercholesterolemia Kidney Stone Lung Cancer Migraine Headache Prostate Cancer     Review of Systems Jeremy Gins Landry; 09/28/2018 9:23 AM) General Present- Fatigue, Night Sweats and Weight Gain. Not Present- Appetite Loss, Chills, Fever and Weight Loss. Skin Not Present- Change in Wart/Mole, Dryness, Hives, Jaundice, New Lesions, Non-Healing Wounds, Rash and Ulcer. HEENT Present- Hearing Loss, Ringing in the Ears, Visual Disturbances and Wears glasses/contact lenses. Not Present- Earache, Hoarseness, Nose Bleed, Oral Ulcers, Seasonal Allergies, Sinus Pain, Sore Throat and Yellow Eyes. Respiratory Present- Difficulty Breathing. Not Present- Bloody sputum, Chronic Cough, Snoring and Wheezing. Breast Not Present- Breast Mass, Breast Pain, Nipple Discharge and Skin Changes. Cardiovascular Present- Chest Pain, Difficulty Breathing Lying Down and Shortness of Breath. Not Present- Leg Cramps, Palpitations, Rapid Heart Rate and Swelling of Extremities. Gastrointestinal Present- Constipation and Indigestion. Not Present- Abdominal Pain, Bloating, Bloody Stool, Change in Bowel Habits, Chronic diarrhea, Difficulty Swallowing, Excessive gas, Gets full quickly at meals, Hemorrhoids, Nausea, Rectal Pain and Vomiting. Male Genitourinary Present- Change in Urinary Stream, Frequency, Impotence, Nocturia, Urgency and Urine Leakage. Not Present- Blood in Urine and Painful Urination. Musculoskeletal Present- Back Pain, Joint Pain, Joint Stiffness, Muscle Pain and Muscle Weakness. Not Present- Swelling of Extremities. Neurological Present-  Numbness, Tingling, Trouble walking and Weakness. Not Present- Decreased Memory, Fainting, Headaches, Seizures and Tremor. Psychiatric Present- Anxiety, Change in Sleep Pattern and Depression. Not Present- Bipolar, Fearful and Frequent crying. Endocrine Present- Cold Intolerance and Heat Intolerance. Not Present- Excessive Hunger, Hair Changes, Hot flashes and New Diabetes. Hematology Present- Blood Thinners. Not Present- Easy Bruising, Excessive bleeding, Gland problems, HIV and Persistent  Infections.  Vitals Jeremy Gins Landry; 09/28/2018 9:24 AM) 09/28/2018 9:24 AM Weight: 281.6 lb Height: 74in Body Surface Area: 2.51 m Body Mass Index: 36.15 kg/m  Temp.: 98.17F  Pulse: 60 (Regular)  BP: 168/90 (Sitting, Left Arm, Standard)        Physical Exam Jeremy Key K. [name] Landry; 09/28/2018 11:36 AM)  The physical exam findings are as follows: Note:WDWN in NAD Left lateral deltoid - protruding subcutaneous mass - 10 x 15 cm    Assessment & Plan Jeremy Key K. 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 The surgical procedure has been discussed with the patient. Potential risks, benefits, alternative treatments, and expected outcomes have been explained. All of the patient's questions at this time have been answered. The likelihood of reaching the patient's treatment goal is good. The patient understand the proposed surgical procedure and wishes to proceed.  Jeremy Landry. Jeremy Dover, Landry, [facility] Beeper [phone]  09/28/2018 11:36 AM

## 2018-09-28 NOTE — H&P (View-Only) (Signed)
History of Present Illness Jeremy Landry. Mike Berntsen MD; 09/28/2018 11:35 AM) The patient is a 73 year old male who presents with a complaint of Mass. Referred by Dustin Folks, NP for left shoulder mass  This is a 73 year old male who presents with a two-year history of an enlarging mass on the lateral left shoulder. This has become fairly large and uncomfortable. He had a previous similar lipoma removed from the right side of his neck by Dr. Towanda Malkin couple of years ago. He presents now to discuss excision of this new left shoulder lipoma. No imaging of this area.   Problem List/Past Medical Jeremy Key K. 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 LORazepam (1MG  Tablet, Oral) Active. Atorvastatin Calcium (40MG  Tablet, Oral) Active. Cyclobenzaprine HCl (5MG  Tablet, Oral) Active. Furosemide (20MG  Tablet, Oral) Active. Symbicort (160-4.5MCG/ACT Aerosol, Inhalation) Active. Striverdi Respimat (2.5MCG/ACT [employer], Training and development officer) Active. Omeprazole (20MG  Capsule ER, Oral) Active. Terazosin HCl (5MG  Tablet, Oral) Active. Prazosin HCl (1MG  Capsule, Oral) Active. Nitroglycerin (0.4MG  Tab Sublingual, Sublingual) Active. Clopidogrel Bisulfate (75MG  Tablet, Oral) Active. Medications Reconciled  Social History Emeline Gins, [place]; 09/28/2018 9:23 AM) Alcohol use Remotely quit alcohol use. Caffeine use Carbonated beverages, Coffee, Tea. Illicit drug use Remotely quit drug use. Tobacco use Former smoker.  Family History Emeline Gins, [place]; 09/28/2018 9:23  AM) Family history unknown First Degree Relatives  Other Problems Jeremy Landry. Jeremy Malmstrom, MD; 09/28/2018 11:31 AM) Alcohol Abuse Anxiety Disorder Arthritis Back Pain Bladder Problems Cerebrovascular Accident Chest pain Chronic Obstructive Lung Disease Depression Enlarged Prostate Gastroesophageal Reflux Disease High blood pressure Home Oxygen Use Hypercholesterolemia Kidney Stone Lung Cancer Migraine Headache Prostate Cancer     Review of Systems Emeline Gins CMA; 09/28/2018 9:23 AM) General Present- Fatigue, Night Sweats and Weight Gain. Not Present- Appetite Loss, Chills, Fever and Weight Loss. Skin Not Present- Change in Wart/Mole, Dryness, Hives, Jaundice, New Lesions, Non-Healing Wounds, Rash and Ulcer. HEENT Present- Hearing Loss, Ringing in the Ears, Visual Disturbances and Wears glasses/contact lenses. Not Present- Earache, Hoarseness, Nose Bleed, Oral Ulcers, Seasonal Allergies, Sinus Pain, Sore Throat and Yellow Eyes. Respiratory Present- Difficulty Breathing. Not Present- Bloody sputum, Chronic Cough, Snoring and Wheezing. Breast Not Present- Breast Mass, Breast Pain, Nipple Discharge and Skin Changes. Cardiovascular Present- Chest Pain, Difficulty Breathing Lying Down and Shortness of Breath. Not Present- Leg Cramps, Palpitations, Rapid Heart Rate and Swelling of Extremities. Gastrointestinal Present- Constipation and Indigestion. Not Present- Abdominal Pain, Bloating, Bloody Stool, Change in Bowel Habits, Chronic diarrhea, Difficulty Swallowing, Excessive gas, Gets full quickly at meals, Hemorrhoids, Nausea, Rectal Pain and Vomiting. Male Genitourinary Present- Change in Urinary Stream, Frequency, Impotence, Nocturia, Urgency and Urine Leakage. Not Present- Blood in Urine and Painful Urination. Musculoskeletal Present- Back Pain, Joint Pain, Joint Stiffness, Muscle Pain and Muscle Weakness. Not Present- Swelling of Extremities. Neurological Present-  Numbness, Tingling, Trouble walking and Weakness. Not Present- Decreased Memory, Fainting, Headaches, Seizures and Tremor. Psychiatric Present- Anxiety, Change in Sleep Pattern and Depression. Not Present- Bipolar, Fearful and Frequent crying. Endocrine Present- Cold Intolerance and Heat Intolerance. Not Present- Excessive Hunger, Hair Changes, Hot flashes and New Diabetes. Hematology Present- Blood Thinners. Not Present- Easy Bruising, Excessive bleeding, Gland problems, HIV and Persistent  Infections.  Vitals Emeline Gins CMA; 09/28/2018 9:24 AM) 09/28/2018 9:24 AM Weight: 281.6 lb Height: 74in Body Surface Area: 2.51 m Body Mass Index: 36.15 kg/m  Temp.: 98.64F  Pulse: 60 (Regular)  BP: 168/90 (Sitting, Left Arm, Standard)        Physical Exam Jeremy Key K. Dalis Beers MD; 09/28/2018 11:36 AM)  The physical exam findings are as follows: Note:WDWN in NAD Left lateral deltoid - protruding subcutaneous mass - 10 x 15 cm    Assessment & Plan Jeremy Key K. 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 The surgical procedure has been discussed with the patient. Potential risks, benefits, alternative treatments, and expected outcomes have been explained. All of the patient's questions at this time have been answered. The likelihood of reaching the patient's treatment goal is good. The patient understand the proposed surgical procedure and wishes to proceed.  Jeremy Landry. Georgette Dover, MD, [place] [facility] Beeper [phone]  09/28/2018 11:36 AM

## 2018-10-22 ENCOUNTER — Encounter (HOSPITAL_BASED_OUTPATIENT_CLINIC_OR_DEPARTMENT_OTHER): Payer: Self-pay | Admitting: *Deleted

## 2018-10-24 ENCOUNTER — Other Ambulatory Visit (HOSPITAL_COMMUNITY)
Admission: RE | Admit: 2018-10-24 | Discharge: 2018-10-24 | Disposition: A | Discharge status: Home / Self Care | Payer: Medicare HMO | Source: Ambulatory Visit | Attending: Surgery | Admitting: Surgery

## 2018-10-24 DIAGNOSIS — Z01812 Encounter for preprocedural laboratory examination: Secondary | ICD-10-CM | POA: Insufficient documentation

## 2018-10-24 DIAGNOSIS — Z20828 Contact with and (suspected) exposure to other viral communicable diseases: Secondary | ICD-10-CM | POA: Insufficient documentation

## 2018-10-25 LAB — NOVEL CORONAVIRUS, NAA (HOSP ORDER, SEND-OUT TO REF LAB; TAT 18-24 HRS): SARS-CoV-2, NAA: NOT DETECTED

## 2018-10-27 ENCOUNTER — Other Ambulatory Visit: Payer: Self-pay

## 2018-10-27 ENCOUNTER — Encounter (HOSPITAL_COMMUNITY): Payer: Self-pay | Admitting: *Deleted

## 2018-10-27 MED ORDER — DEXTROSE 5 % IV SOLN
3.0000 g | INTRAVENOUS | Status: AC
  Administered 2018-10-28: 3 g via INTRAVENOUS
  Filled 2018-10-27: qty 3

## 2018-10-27 NOTE — Progress Notes (Signed)
Nurse lvm with Cipriano Mile, Operations Manager, to make MD aware that pt stopped Plavix " about a month ago."

## 2018-10-27 NOTE — Progress Notes (Signed)
Pt denies any acute pulmonary issues. Pt stated that he wears Oxygen PRN  " when I move around because I have COPD."   Pt denies chest pain. Pt stated that he sees a cardiologist at the [facility] in [place] ( pt cannot recall cardiologist name ). Pt stated that he is under the care of Dr. Ernestene Kiel, PCP at the [facility] and Dr. Dustin Folks, PCP (not a [place] provider). Pt stated that he had a stress test, echo and cardiac cath performed at the [facility], [place]. Pt stated that he had an EKG and chest x ray in the last year at the [facility]. Nurse requested all cardiac studies , LOV note, EKG and chest x ray from [facility], [place]; nurse awaiting response.  Pt stated that he took his last dose of Plavix " about a month ago." Pt stated that he was not advised by a physician to stop taking Plavix. Pt made aware to stop taking  vitamins, fish oil and herbal medications. Do not take any NSAIDs ie: Ibuprofen, Advil, Naproxen (Aleve), Motrin, BC and Goody Powder. Pt verbalized understanding of all pre-op instructions. PA, Anesthesiology, asked to review pt history.

## 2018-10-27 NOTE — Progress Notes (Signed)
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   History includes former smoker (quit 2016), COPD (PRN home O2), HTN, CHF, GERD, alcoholism, glaucoma, lung cancer, prostate cancer, CVA (2012), C4-6 ACDF. Notes in Epic document occasional beer once or twice a week.   Patient is a same day work-up. He receives most care through [facility], including cardiology. Currently most recent records have not been received from the [facility], but had only mild CAD by 2018 cath and normal LVEF by 2017 echo. If records not received by the [facility] then he will need an EKG on arrival with labs. He has been off Plavix for the past month.   He is a same day work-up, so further evaluation by his anesthesia team on the day of surgery. 10/24/18 COVID-19 test negative.   VS: Ht 6\' 2"  (1.88 m)   Wt 127 kg   BMI 35.95 kg/m   PROVIDERS: Sonia Side., FNP is PCP. He receives some primary care through the [facility] ("Dr. Ernestene Kiel").  Christinia Gully, MD is local pulmonologist. Last visit 01/23/17 DOE. Restrictive pattern on spirometry, possibly due to obesity. No pulmonary intervention recommended--f/u with [facility]. (It appears lung mass was being worked up at that time at the [facility] by Danton Sewer, MD, but no records currently available.)     LABS: For day of surgery.   Spirometry 01/15/17: FVC 0.9 (21%), FEV1 0.8 (24%), FEV1/FVC 85% (112%), FEF25-75 1.0 (36%). Very severe restriction.   IMAGES: PET Scan 01/30/17: IMPRESSION: 1. Right infrahilar mass 3.5 by 2.5 cm with maximum SUV 19.8. No appreciable hypermetabolic adenopathy in the chest or distant metastatic disease. Assuming  non-small cell lung cancer, imaging appearance is most suggestive of T2a N0 M0 disease (stage Ib). 2. 6 by 5 mm left lower lobe pulmonary nodule is not hypermetabolic but is below sensitive PET-CT size thresholds and merits surveillance. 3. Deformity with some expansion and mixed sclerosis in the left posterolateral sixth rib, not changed from 2016 and not hypermetabolic, possibly from old trauma or benign process. 4. Mildly hyperdense 4.3 cm lesion of the left kidney upper pole is photopenic and hence likely a complex cyst.   EKG: Last EKG currently available is > 27 year old.    CV:  Cardiac cath 07/03/16 [facility]; scanned under Media tab, Correspondence 02/06/17): 1.  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 2.  Codominant coronary system. 3.  Normal LVEDP  Echo 09/28/15 ([facility]; scanned under Media tab, Correspondence 02/06/17): 1.  LV upper normal in size.  Mild concentric LVH.  EF >55%.  No obvious regional wall motion abnormalities noted. 2.  LA mildly dilated. 3.  Aortic valve opens well.  No stenosis. 4.  There is trace mitral regurgitation. 5.  Tricuspid valve grossly normal. 6.  No pericardial effusion.   Past Medical History:  Diagnosis Date  . Alcoholism ([place])   . Anxiety   . Arthritis   . Chronic headaches   . Congestive heart failure ([place])   . COPD (chronic obstructive pulmonary disease) ([place])   . Depression   . GERD (gastroesophageal reflux disease)   .  Glaucoma   . History of kidney stones   . Hypertension   . Kidney stones   . Lung cancer ([place])   . Pneumonia   . Prostate cancer ([place])   . Stroke [facility])    " small stroke one time "  . Wears glasses   . Wears partial dentures     Past Surgical History:  Procedure Laterality Date  . CERVICAL DISC SURGERY    . COLONOSCOPY WITH PROPOFOL N/A 09/05/2015   Procedure: COLONOSCOPY WITH PROPOFOL;  Surgeon: Doran Stabler, MD;  Location: [facility];  Service:  Gastroenterology;  Laterality: N/A;  . LUMBAR DISC SURGERY     x 3  . RADIOLOGY WITH ANESTHESIA N/A 02/06/2017   Procedure: MRI OF THE BRAIN WITH AND WITHOUT CONTRAST;  Surgeon: Radiologist, Medication, MD;  Location: [place];  Service: Radiology;  Laterality: N/A;    MEDICATIONS: . [START ON 10/28/2018] ceFAZolin (ANCEF) 3 g in dextrose 5 % 50 mL IVPB   . albuterol (PROAIR HFA) 108 (90 Base) MCG/ACT inhaler  . atorvastatin (LIPITOR) 40 MG tablet  . brimonidine (ALPHAGAN P) 0.1 % SOLN  . carboxymethylcellulose (REFRESH PLUS) 0.5 % SOLN  . Cholecalciferol 50 MCG (2000 UT) TABS  . clopidogrel (PLAVIX) 75 MG tablet  . escitalopram (LEXAPRO) 10 MG tablet  . finasteride (PROSCAR) 5 MG tablet  . nitroGLYCERIN (NITROSTAT) 0.4 MG SL tablet  . omeprazole (PRILOSEC) 20 MG capsule  . OXYGEN  . prazosin (MINIPRESS) 1 MG capsule  . terazosin (HYTRIN) 5 MG capsule  . traZODone (DESYREL) 50 MG tablet  . furosemide (LASIX) 20 MG tablet  . LORazepam (ATIVAN) 1 MG tablet     Myra Gianotti, PA-C Surgical Short Stay/Anesthesiology [facility] Phone [phone] [facility] Phone [phone] 10/27/2018 4:32 PM

## 2018-10-27 NOTE — Anesthesia Preprocedure Evaluation (Addendum)
Anesthesia Evaluation  Patient identified by MRN, date of birth, ID band Patient awake    Reviewed: Allergy & Precautions, NPO status , Patient's Chart, lab work & pertinent test results  Airway Mallampati: II  TM Distance: >3 FB Neck ROM: Full    Dental no notable dental hx.    Pulmonary neg pulmonary ROS, COPD, former smoker,    Pulmonary exam normal breath sounds clear to auscultation       Cardiovascular hypertension, Pt. on medications + DOE  negative cardio ROS Normal cardiovascular exam Rhythm:Regular Rate:Normal     Neuro/Psych  Headaches, Anxiety Depression CVA negative neurological ROS  negative psych ROS   GI/Hepatic negative GI ROS, Neg liver ROS, GERD  ,  Endo/Other  negative endocrine ROS  Renal/GU negative Renal ROS  negative genitourinary   Musculoskeletal negative musculoskeletal ROS (+) Arthritis , Osteoarthritis,    Abdominal   Peds negative pediatric ROS (+)  Hematology negative hematology ROS (+)   Anesthesia Other Findings   Reproductive/Obstetrics negative OB ROS                                                             Anesthesia Evaluation  Patient identified by MRN, date of birth, ID band Patient awake    Reviewed: Allergy & Precautions, H&P , NPO status , Patient's Chart, lab work & pertinent test results  History of Anesthesia Complications Negative for: history of anesthetic complications  Airway Mallampati: II  TM Distance: >3 FB Neck ROM: full    Dental  (+) Dental Advisory Given, Missing   Pulmonary COPD,  oxygen dependent, former smoker,    Pulmonary exam normal breath sounds clear to auscultation       Cardiovascular hypertension, +CHF  Normal cardiovascular exam Rhythm:regular Rate:Normal     Neuro/Psych  Headaches, PSYCHIATRIC DISORDERS Anxiety Depression    GI/Hepatic GERD  ,(+)     substance abuse  alcohol use,    Endo/Other  negative endocrine ROS  Renal/GU Renal disease     Musculoskeletal   Abdominal   Peds  Hematology negative hematology ROS (+)   Anesthesia Other Findings   Reproductive/Obstetrics negative OB ROS                             Anesthesia Physical  Anesthesia Plan  ASA: III  Anesthesia Plan: General   Post-op Pain Management:    Induction: Intravenous  PONV Risk Score and Plan: 2 and Ondansetron and Dexamethasone  Airway Management Planned: Oral ETT  Additional Equipment:   Intra-op Plan:   Post-operative Plan: Extubation in OR  Informed Consent: I have reviewed the patients History and Physical, chart, labs and discussed the procedure including the risks, benefits and alternatives for the proposed anesthesia with the patient or authorized representative who has indicated his/her understanding and acceptance.   Dental Advisory Given  Plan Discussed with: Anesthesiologist, CRNA and Surgeon  Anesthesia Plan Comments:         Anesthesia Quick Evaluation  Anesthesia Physical Anesthesia Plan  ASA: III  Anesthesia Plan: General   Post-op Pain Management:    Induction: Intravenous  PONV Risk Score and Plan: 2 and Ondansetron, Midazolam and Treatment may vary due to age or medical condition  Airway Management Planned:  LMA  Additional Equipment:   Intra-op Plan:   Post-operative Plan: Extubation in OR  Informed Consent: I have reviewed the patients History and Physical, chart, labs and discussed the procedure including the risks, benefits and alternatives for the proposed anesthesia with the patient or authorized representative who has indicated his/her understanding and acceptance.     Dental advisory given  Plan Discussed with: CRNA  Anesthesia Plan Comments: (PAT note written 10/27/2018 by Myra Gianotti, PA-C. SAME DAY WORK-UP   )       Anesthesia Quick Evaluation

## 2018-10-28 ENCOUNTER — Other Ambulatory Visit: Payer: Self-pay

## 2018-10-28 ENCOUNTER — Encounter (HOSPITAL_COMMUNITY)
Admission: RE | Disposition: A | Discharge status: Home / Self Care | Payer: Self-pay | Source: Home / Self Care | Attending: Surgery

## 2018-10-28 ENCOUNTER — Ambulatory Visit (HOSPITAL_COMMUNITY): Payer: Medicare HMO | Admitting: Vascular Surgery

## 2018-10-28 ENCOUNTER — Ambulatory Visit (HOSPITAL_COMMUNITY)
Admission: RE | Admit: 2018-10-28 | Discharge: 2018-10-28 | Disposition: A | Discharge status: Home / Self Care | Payer: Medicare HMO | Attending: Surgery | Admitting: Surgery

## 2018-10-28 ENCOUNTER — Encounter (HOSPITAL_COMMUNITY): Payer: Self-pay

## 2018-10-28 DIAGNOSIS — I110 Hypertensive heart disease with heart failure: Secondary | ICD-10-CM | POA: Diagnosis not present

## 2018-10-28 DIAGNOSIS — I2510 Atherosclerotic heart disease of native coronary artery without angina pectoris: Secondary | ICD-10-CM | POA: Diagnosis not present

## 2018-10-28 DIAGNOSIS — Z886 Allergy status to analgesic agent status: Secondary | ICD-10-CM | POA: Insufficient documentation

## 2018-10-28 DIAGNOSIS — D179 Benign lipomatous neoplasm, unspecified: Secondary | ICD-10-CM | POA: Insufficient documentation

## 2018-10-28 DIAGNOSIS — M1990 Unspecified osteoarthritis, unspecified site: Secondary | ICD-10-CM | POA: Insufficient documentation

## 2018-10-28 DIAGNOSIS — J449 Chronic obstructive pulmonary disease, unspecified: Secondary | ICD-10-CM | POA: Diagnosis not present

## 2018-10-28 DIAGNOSIS — F419 Anxiety disorder, unspecified: Secondary | ICD-10-CM | POA: Diagnosis not present

## 2018-10-28 DIAGNOSIS — Z888 Allergy status to other drugs, medicaments and biological substances status: Secondary | ICD-10-CM | POA: Diagnosis not present

## 2018-10-28 DIAGNOSIS — R519 Headache, unspecified: Secondary | ICD-10-CM | POA: Insufficient documentation

## 2018-10-28 DIAGNOSIS — M549 Dorsalgia, unspecified: Secondary | ICD-10-CM | POA: Insufficient documentation

## 2018-10-28 DIAGNOSIS — Z87891 Personal history of nicotine dependence: Secondary | ICD-10-CM | POA: Insufficient documentation

## 2018-10-28 DIAGNOSIS — H409 Unspecified glaucoma: Secondary | ICD-10-CM | POA: Diagnosis not present

## 2018-10-28 DIAGNOSIS — K219 Gastro-esophageal reflux disease without esophagitis: Secondary | ICD-10-CM | POA: Diagnosis not present

## 2018-10-28 DIAGNOSIS — Z79899 Other long term (current) drug therapy: Secondary | ICD-10-CM | POA: Diagnosis not present

## 2018-10-28 DIAGNOSIS — Z87442 Personal history of urinary calculi: Secondary | ICD-10-CM | POA: Diagnosis not present

## 2018-10-28 DIAGNOSIS — I509 Heart failure, unspecified: Secondary | ICD-10-CM | POA: Diagnosis not present

## 2018-10-28 DIAGNOSIS — Z91010 Allergy to peanuts: Secondary | ICD-10-CM | POA: Diagnosis not present

## 2018-10-28 DIAGNOSIS — N400 Enlarged prostate without lower urinary tract symptoms: Secondary | ICD-10-CM | POA: Insufficient documentation

## 2018-10-28 DIAGNOSIS — Z8673 Personal history of transient ischemic attack (TIA), and cerebral infarction without residual deficits: Secondary | ICD-10-CM | POA: Insufficient documentation

## 2018-10-28 DIAGNOSIS — Z9981 Dependence on supplemental oxygen: Secondary | ICD-10-CM | POA: Insufficient documentation

## 2018-10-28 DIAGNOSIS — E7800 Pure hypercholesterolemia, unspecified: Secondary | ICD-10-CM | POA: Diagnosis not present

## 2018-10-28 DIAGNOSIS — Z85118 Personal history of other malignant neoplasm of bronchus and lung: Secondary | ICD-10-CM | POA: Insufficient documentation

## 2018-10-28 DIAGNOSIS — Z8546 Personal history of malignant neoplasm of prostate: Secondary | ICD-10-CM | POA: Diagnosis not present

## 2018-10-28 DIAGNOSIS — D1722 Benign lipomatous neoplasm of skin and subcutaneous tissue of left arm: Secondary | ICD-10-CM | POA: Diagnosis present

## 2018-10-28 DIAGNOSIS — F329 Major depressive disorder, single episode, unspecified: Secondary | ICD-10-CM | POA: Diagnosis not present

## 2018-10-28 HISTORY — DX: Unspecified osteoarthritis, unspecified site: M19.90

## 2018-10-28 HISTORY — DX: Presence of spectacles and contact lenses: Z97.3

## 2018-10-28 HISTORY — PX: LIPOMA EXCISION: SHX5283

## 2018-10-28 HISTORY — DX: Presence of dental prosthetic device (complete) (partial): Z97.2

## 2018-10-28 HISTORY — DX: Cerebral infarction, unspecified: I63.9

## 2018-10-28 LAB — COMPREHENSIVE METABOLIC PANEL
ALT: 31 U/L (ref 0–44)
AST: 33 U/L (ref 15–41)
Albumin: 3.9 g/dL (ref 3.5–5.0)
Alkaline Phosphatase: 64 U/L (ref 38–126)
Anion gap: 10 (ref 5–15)
BUN: 13 mg/dL (ref 8–23)
CO2: 21 mmol/L — ABNORMAL LOW (ref 22–32)
Calcium: 9.5 mg/dL (ref 8.9–10.3)
Chloride: 108 mmol/L (ref 98–111)
Creatinine, Ser: 0.87 mg/dL (ref 0.61–1.24)
GFR calc Af Amer: >60 mL/min (ref >60)
GFR calc non Af Amer: >60 mL/min (ref >60)
Glucose, Bld: 111 mg/dL — ABNORMAL HIGH (ref 70–99)
Potassium: 4.2 mmol/L (ref 3.5–5.1)
Sodium: 139 mmol/L (ref 135–145)
Total Bilirubin: 1 mg/dL (ref 0.3–1.2)
Total Protein: 7.3 g/dL (ref 6.5–8.1)

## 2018-10-28 LAB — CBC
HCT: 38.3 % — ABNORMAL LOW (ref 39.0–52.0)
Hemoglobin: 12.3 g/dL — ABNORMAL LOW (ref 13.0–17.0)
MCH: 30.1 pg (ref 26.0–34.0)
MCHC: 32.1 g/dL (ref 30.0–36.0)
MCV: 93.6 fL (ref 80.0–100.0)
Platelets: 217 10*3/uL (ref 150–400)
RBC: 4.09 MIL/uL — ABNORMAL LOW (ref 4.22–5.81)
RDW: 14.5 % (ref 11.5–15.5)
WBC: 4.2 10*3/uL (ref 4.0–10.5)
nRBC: 0 % (ref 0.0–0.2)

## 2018-10-28 SURGERY — EXCISION LIPOMA
Anesthesia: General | Site: Shoulder | Laterality: Left

## 2018-10-28 MED ORDER — DEXAMETHASONE SODIUM PHOSPHATE 10 MG/ML IJ SOLN
INTRAMUSCULAR | Status: AC
  Filled 2018-10-28: qty 2

## 2018-10-28 MED ORDER — FENTANYL CITRATE (PF) 100 MCG/2ML IJ SOLN
INTRAMUSCULAR | Status: DC | PRN
  Administered 2018-10-28: 50 ug via INTRAVENOUS

## 2018-10-28 MED ORDER — HYDROMORPHONE HCL 1 MG/ML IJ SOLN: 0.2500 mg | INTRAMUSCULAR | Status: DC | PRN

## 2018-10-28 MED ORDER — KETOROLAC TROMETHAMINE 30 MG/ML IJ SOLN
INTRAMUSCULAR | Status: AC
  Filled 2018-10-28: qty 1

## 2018-10-28 MED ORDER — ACETAMINOPHEN 500 MG PO TABS
1000.0000 mg | ORAL_TABLET | ORAL | Status: AC
  Administered 2018-10-28: 09:00:00 1000 mg via ORAL
  Filled 2018-10-28: qty 2

## 2018-10-28 MED ORDER — BUPIVACAINE-EPINEPHRINE (PF) 0.25% -1:200000 IJ SOLN
INTRAMUSCULAR | Status: AC
  Filled 2018-10-28: qty 10

## 2018-10-28 MED ORDER — GABAPENTIN 300 MG PO CAPS
300.0000 mg | ORAL_CAPSULE | ORAL | Status: AC
  Administered 2018-10-28: 300 mg via ORAL
  Filled 2018-10-28: qty 1

## 2018-10-28 MED ORDER — BUPIVACAINE-EPINEPHRINE 0.25% -1:200000 IJ SOLN
INTRAMUSCULAR | Status: DC | PRN
  Administered 2018-10-28: 10 mL

## 2018-10-28 MED ORDER — CHLORHEXIDINE GLUCONATE CLOTH 2 % EX PADS: 6.0000 | MEDICATED_PAD | Freq: Once | CUTANEOUS | Status: DC

## 2018-10-28 MED ORDER — FENTANYL CITRATE (PF) 250 MCG/5ML IJ SOLN
INTRAMUSCULAR | Status: AC
  Filled 2018-10-28: qty 5

## 2018-10-28 MED ORDER — ONDANSETRON HCL 4 MG/2ML IJ SOLN
INTRAMUSCULAR | Status: AC
  Filled 2018-10-28: qty 4

## 2018-10-28 MED ORDER — ROCURONIUM BROMIDE 10 MG/ML (PF) SYRINGE
PREFILLED_SYRINGE | INTRAVENOUS | Status: AC
  Filled 2018-10-28: qty 10

## 2018-10-28 MED ORDER — DEXAMETHASONE SODIUM PHOSPHATE 10 MG/ML IJ SOLN
INTRAMUSCULAR | Status: AC
  Filled 2018-10-28: qty 1

## 2018-10-28 MED ORDER — PROMETHAZINE HCL 25 MG/ML IJ SOLN: 6.2500 mg | INTRAMUSCULAR | Status: DC | PRN

## 2018-10-28 MED ORDER — LIDOCAINE 2% (20 MG/ML) 5 ML SYRINGE
INTRAMUSCULAR | Status: AC
  Filled 2018-10-28: qty 5

## 2018-10-28 MED ORDER — LIDOCAINE 2% (20 MG/ML) 5 ML SYRINGE
INTRAMUSCULAR | Status: DC | PRN
  Administered 2018-10-28: 60 mg via INTRAVENOUS

## 2018-10-28 MED ORDER — PROPOFOL 10 MG/ML IV BOLUS
INTRAVENOUS | Status: AC
  Filled 2018-10-28: qty 20

## 2018-10-28 MED ORDER — PROPOFOL 10 MG/ML IV BOLUS
INTRAVENOUS | Status: DC | PRN
  Administered 2018-10-28: 150 mg via INTRAVENOUS

## 2018-10-28 MED ORDER — DEXAMETHASONE SODIUM PHOSPHATE 10 MG/ML IJ SOLN
INTRAMUSCULAR | Status: DC | PRN
  Administered 2018-10-28: 10 mg via INTRAVENOUS

## 2018-10-28 MED ORDER — LACTATED RINGERS IV SOLN
INTRAVENOUS | Status: DC
  Administered 2018-10-28: 09:00:00 via INTRAVENOUS

## 2018-10-28 MED ORDER — 0.9 % SODIUM CHLORIDE (POUR BTL) OPTIME
TOPICAL | Status: DC | PRN
  Administered 2018-10-28: 11:00:00 1000 mL

## 2018-10-28 MED ORDER — LACTATED RINGERS IV SOLN
INTRAVENOUS | Status: DC | PRN
  Administered 2018-10-28: 09:00:00 via INTRAVENOUS

## 2018-10-28 MED ORDER — OXYCODONE HCL 5 MG PO TABS
ORAL_TABLET | ORAL | Status: AC
  Filled 2018-10-28: qty 1

## 2018-10-28 MED ORDER — OXYCODONE HCL 5 MG PO TABS: 5.0000 mg | ORAL_TABLET | Freq: Four times a day (QID) | ORAL | 0 refills | Status: DC | PRN

## 2018-10-28 MED ORDER — OXYCODONE HCL 5 MG PO TABS
5.0000 mg | ORAL_TABLET | Freq: Once | ORAL | Status: AC | PRN
  Administered 2018-10-28: 5 mg via ORAL

## 2018-10-28 MED ORDER — OXYCODONE HCL 5 MG/5ML PO SOLN: 5.0000 mg | Freq: Once | ORAL | Status: AC | PRN

## 2018-10-28 MED ORDER — PHENYLEPHRINE 40 MCG/ML (10ML) SYRINGE FOR IV PUSH (FOR BLOOD PRESSURE SUPPORT)
PREFILLED_SYRINGE | INTRAVENOUS | Status: AC
  Filled 2018-10-28: qty 20

## 2018-10-28 SURGICAL SUPPLY — 39 items
APL PRP STRL LF DISP 70% ISPRP (MISCELLANEOUS) ×1
APL SKNCLS STERI-STRIP NONHPOA (GAUZE/BANDAGES/DRESSINGS) ×1
BENZOIN TINCTURE PRP APPL 2/3 (GAUZE/BANDAGES/DRESSINGS) ×2 IMPLANT
BLADE CLIPPER SURG (BLADE) IMPLANT
CHLORAPREP W/TINT 26 (MISCELLANEOUS) ×2 IMPLANT
CLSR STERI-STRIP ANTIMIC 1/2X4 (GAUZE/BANDAGES/DRESSINGS) ×1 IMPLANT
COVER SURGICAL LIGHT HANDLE (MISCELLANEOUS) ×1 IMPLANT
COVER WAND RF STERILE (DRAPES) ×2 IMPLANT
DRAPE LAPAROTOMY 100X72 PEDS (DRAPES) ×2 IMPLANT
DRSG TEGADERM 2-3/8X2-3/4 SM (GAUZE/BANDAGES/DRESSINGS) ×1 IMPLANT
ELECT CAUTERY BLADE 6.4 (BLADE) ×1 IMPLANT
ELECT REM PT RETURN 9FT ADLT (ELECTROSURGICAL) ×2
ELECTRODE REM PT RTRN 9FT ADLT (ELECTROSURGICAL) ×1 IMPLANT
GAUZE 4X4 16PLY RFD (DISPOSABLE) ×1 IMPLANT
GAUZE SPONGE 4X4 12PLY STRL (GAUZE/BANDAGES/DRESSINGS) ×2 IMPLANT
GLOVE BIO SURGEON STRL SZ7 (GLOVE) ×2 IMPLANT
GLOVE BIOGEL PI IND STRL 7.5 (GLOVE) ×1 IMPLANT
GLOVE BIOGEL PI INDICATOR 7.5 (GLOVE) ×1
GOWN STRL REUS W/ TWL LRG LVL3 (GOWN DISPOSABLE) ×2 IMPLANT
GOWN STRL REUS W/TWL LRG LVL3 (GOWN DISPOSABLE) ×4
KIT BASIN OR (CUSTOM PROCEDURE TRAY) ×2 IMPLANT
KIT TURNOVER KIT B (KITS) ×2 IMPLANT
NDL HYPO 25GX1X1/2 BEV (NEEDLE) ×1 IMPLANT
NEEDLE HYPO 25GX1X1/2 BEV (NEEDLE) ×2 IMPLANT
NS IRRIG 1000ML POUR BTL (IV SOLUTION) ×2 IMPLANT
PACK GENERAL/GYN (CUSTOM PROCEDURE TRAY) ×2 IMPLANT
PAD ARMBOARD 7.5X6 YLW CONV (MISCELLANEOUS) ×2 IMPLANT
PENCIL SMOKE EVACUATOR (MISCELLANEOUS) ×2 IMPLANT
SPECIMEN JAR SMALL (MISCELLANEOUS) ×2 IMPLANT
SPONGE GAUZE 2X2 8PLY STRL LF (GAUZE/BANDAGES/DRESSINGS) ×1 IMPLANT
STRIP CLOSURE SKIN 1/2X4 (GAUZE/BANDAGES/DRESSINGS) ×2 IMPLANT
SUT MNCRL AB 4-0 PS2 18 (SUTURE) ×2 IMPLANT
SUT VIC AB 2-0 SH 27 (SUTURE)
SUT VIC AB 2-0 SH 27X BRD (SUTURE) IMPLANT
SUT VIC AB 3-0 SH 27 (SUTURE) ×2
SUT VIC AB 3-0 SH 27XBRD (SUTURE) ×1 IMPLANT
SYR CONTROL 10ML LL (SYRINGE) ×2 IMPLANT
TOWEL GREEN STERILE (TOWEL DISPOSABLE) ×2 IMPLANT
TOWEL GREEN STERILE FF (TOWEL DISPOSABLE) ×2 IMPLANT

## 2018-10-28 NOTE — Op Note (Signed)
Preop diagnosis: Subcutaneous lipoma left shoulder (10 cm) Postop diagnosis: Intramuscular lipoma left shoulder (8 cm) Procedure performed: Excision of intramuscular lipoma left deltoid (8 cm) Surgeon:Vishwa Dais K Laurie Lovejoy Anesthesia: General via LMA Indications:  This is a 73 year old male who presents with a two-year history of an enlarging mass on the lateral left shoulder. This has become fairly large and uncomfortable. He had a previous similar lipoma removed from the right side of his neck by Dr. Towanda Malkin couple of years ago. He presents now to discuss excision of this new left shoulder lipoma. No imaging of this area.  Description of procedure: The patient is brought to the operating room and placed in the supine position on the operating room table with his arm at his side.  After an adequate level of general anesthesia was obtained, his left shoulder was prepped with ChloraPrep and draped in sterile fashion.  I outlined a vertical incision over the palpable mass in the lateral left shoulder.  We infiltrated this area with 0.25% Marcaine with epinephrine.  I made this longitudinal incision and dissected down into the subcutaneous tissue.  We encountered the fascia.  I bluntly dissected through some of the muscle fibers and encountered a large intramuscular lipoma.  We bluntly dissected this free and excised it entirely.  This measured about 8 cm in longest diameter.  We sent this for pathologic examination.  I inspected for hemostasis.  We closed with several layers of 3-0 Vicryl in a subcuticular layer of 4-0 Monocryl.  Benzoin Steri-Strips were applied.  An occlusive dressing was placed.  The patient was then extubated and brought to the recovery room in stable condition.  All sponge, instrument, and needle counts are correct.  Imogene Burn. Georgette Dover, MD, [facility]  General/ Trauma Surgery   10/28/2018 10:57 AM

## 2018-10-28 NOTE — Anesthesia Procedure Notes (Signed)
Procedure Name: LMA Insertion Date/Time: 10/28/2018 10:25 AM Performed by: Neldon Newport, CRNA Pre-anesthesia Checklist: Timeout performed, Patient being monitored, Suction available, Emergency Drugs available and Patient identified Patient Re-evaluated:Patient Re-evaluated prior to induction Oxygen Delivery Method: Circle system utilized Preoxygenation: Pre-oxygenation with 100% oxygen Induction Type: IV induction Ventilation: Mask ventilation without difficulty LMA: LMA inserted LMA Size: 5.0 Placement Confirmation: positive ETCO2 and breath sounds checked- equal and bilateral Tube secured with: Tape Dental Injury: Teeth and Oropharynx as per pre-operative assessment

## 2018-10-28 NOTE — Interval H&P Note (Signed)
History and Physical Interval Note:  10/28/2018 8:13 AM  Jeremy Landry  has presented today for surgery, with the diagnosis of SUBCUTANEOUS LIPOMA LEFT SHOULDER.  The various methods of treatment have been discussed with the patient and family. After consideration of risks, benefits and other options for treatment, the patient has consented to  Procedure(s) with comments: [place] (Left) - LMA as a surgical intervention.  The patient's history has been reviewed, patient examined, no change in status, stable for surgery.  I have reviewed the patient's chart and labs.  Questions were answered to the patient's satisfaction.     Maia Petties

## 2018-10-28 NOTE — Discharge Instructions (Signed)
[place] Office Phone Number [phone]  Lipoma Excision: POST OP INSTRUCTIONS  Always review your discharge instruction sheet given to you by the facility where your surgery was performed.  IF YOU HAVE DISABILITY OR FAMILY LEAVE FORMS, YOU MUST BRING THEM TO THE OFFICE FOR PROCESSING.  DO NOT GIVE THEM TO YOUR DOCTOR.  1. A prescription for pain medication may be given to you upon discharge.  Take your pain medication as prescribed, if needed.  If narcotic pain medicine is not needed, then you may take acetaminophen (Tylenol) or ibuprofen (Advil) as needed. 2. Take your usually prescribed medications unless otherwise directed 3. If you need a refill on your pain medication, please contact your pharmacy.  They will contact our office to request authorization.  Prescriptions will not be filled after 5pm or on week-ends. 4. You should eat very light the first 24 hours after surgery, such as soup, crackers, pudding, etc.  Resume your normal diet the day after surgery. 5. Most patients will experience some swelling and bruising around the surgical site.  Ice packs will help.  Swelling and bruising can take several days to resolve.  6. It is common to experience some constipation if taking pain medication after surgery.  Increasing fluid intake and taking a stool softener will usually help or prevent this problem from occurring.  A mild laxative (Milk of Magnesia or Miralax) should be taken according to package directions if there are no bowel movements after 48 hours. 7. You may remove your bandages 48 hours after surgery, and you may shower at that time.  You will have steri-strips (small skin tapes) in place directly over the incision.  These strips should be left on the skin for 7-10 days.   8. ACTIVITIES:  You may resume regular daily activities (gradually increasing) beginning the next day.   You may have sexual intercourse when it is comfortable. a. You may drive when you no  longer are taking prescription pain medication, you can comfortably wear a seatbelt, and you can safely maneuver your car and apply brakes. b. RETURN TO WORK:  1-2 weeks 9. You should see your doctor in the office for a follow-up appointment approximately two to three weeks after your surgery.    WHEN TO CALL YOUR DOCTOR: 1. Fever over 101.0 2. Nausea and/or vomiting. 3. Extreme swelling or bruising. 4. Continued bleeding from incision. 5. Increased pain, redness, or drainage from the incision.  The clinic staff is available to answer your questions during regular business hours.  Please dont hesitate to call and ask to speak to one of the nurses for clinical concerns.  If you have a medical emergency, go to the nearest emergency room or call 911.  A surgeon from [facility] is always on call at the hospital.  For further questions, please visit [email or web address]

## 2018-10-28 NOTE — Transfer of Care (Signed)
Immediate Anesthesia Transfer of Care Note  Patient: Jeremy Landry  Procedure(s) Performed: EXCISION SUBCUTANEOUS LIPOMA LEFT SHOULDER (Left Shoulder)  Patient Location: PACU  Anesthesia Type:General  Level of Consciousness: awake, alert  and oriented  Airway & Oxygen Therapy: Patient Spontanous Breathing and Patient connected to nasal cannula oxygen  Post-op Assessment: Report given to RN, Post -op Vital signs reviewed and stable and Patient moving all extremities X 4  Post vital signs: Reviewed and stable  Last Vitals:  Vitals Value Taken Time  BP 132/79 10/28/18 1102  Temp    Pulse 90 10/28/18 1101  Resp 20 10/28/18 1101  SpO2 87 % 10/28/18 1101  Vitals shown include unvalidated device data.  Last Pain:  Vitals:   10/28/18 0832  PainSc: 0-No pain         Complications: No apparent anesthesia complications

## 2018-10-28 NOTE — Anesthesia Postprocedure Evaluation (Signed)
Anesthesia Post Note  Patient: Jeremy Landry  Procedure(s) Performed: EXCISION SUBCUTANEOUS LIPOMA LEFT SHOULDER (Left Shoulder)     Patient location during evaluation: PACU Anesthesia Type: General Level of consciousness: awake and alert Pain management: pain level controlled Vital Signs Assessment: post-procedure vital signs reviewed and stable Respiratory status: spontaneous breathing, nonlabored ventilation and respiratory function stable Cardiovascular status: blood pressure returned to baseline and stable Postop Assessment: no apparent nausea or vomiting Anesthetic complications: no    Last Vitals:  Vitals:   10/28/18 1130 10/28/18 1132  BP:  140/89  Pulse:    Resp:    Temp:    SpO2: (!) 81%     Last Pain:  Vitals:   10/28/18 1126  PainSc: 0-No pain                 Lynda Rainwater

## 2018-10-29 ENCOUNTER — Encounter (HOSPITAL_COMMUNITY): Payer: Self-pay | Admitting: Surgery

## 2018-10-29 LAB — SURGICAL PATHOLOGY

## 2018-12-11 ENCOUNTER — Other Ambulatory Visit: Payer: Self-pay | Admitting: Surgery

## 2019-03-03 ENCOUNTER — Telehealth: Payer: Self-pay | Admitting: *Deleted

## 2019-03-03 NOTE — Telephone Encounter (Signed)
Scheduled 1st Covid 19 vaccine for 2/26@11 :45a at the [facility].

## 2019-03-05 ENCOUNTER — Ambulatory Visit: Payer: Medicare HMO | Attending: Internal Medicine

## 2019-03-05 DIAGNOSIS — Z23 Encounter for immunization: Secondary | ICD-10-CM | POA: Insufficient documentation

## 2019-03-05 NOTE — Progress Notes (Signed)
   Covid-19 Vaccination Clinic  Name:  Jeremy Landry    MRN: [id number] DOB: 10-03-1945  03/05/2019  Jeremy Landry was observed post Covid-19 immunization for 30 minutes based on pre-vaccination screening without incidence. He was provided with Vaccine Information Sheet and instruction to access the V-Safe system.   Jeremy Landry was instructed to call 911 with any severe reactions post vaccine: Marland Kitchen Difficulty breathing  . Swelling of your face and throat  . A fast heartbeat  . A bad rash all over your body  . Dizziness and weakness    Immunizations Administered    Name Date Dose VIS Date Route   Pfizer COVID-19 Vaccine 03/05/2019 11:38 AM 0.3 mL 12/18/2018 Intramuscular   Manufacturer: [place]   Lot: [id number]   [place]: [address]-6

## 2019-03-30 ENCOUNTER — Ambulatory Visit: Payer: Medicare HMO | Attending: Internal Medicine

## 2019-03-30 DIAGNOSIS — Z23 Encounter for immunization: Secondary | ICD-10-CM

## 2019-03-30 NOTE — Progress Notes (Signed)
   Covid-19 Vaccination Clinic  Name:  Jeremy Landry    MRN: [id number] DOB: [birth year]  03/30/2019  Mr. [name] was observed post Covid-19 immunization for 15 minutes without incident. He was provided with Vaccine Information Sheet and instruction to access the V-Safe system.   Mr. [name] was instructed to call 911 with any severe reactions post vaccine: Marland Kitchen Difficulty breathing  . Swelling of face and throat  . A fast heartbeat  . A bad rash all over body  . Dizziness and weakness   Immunizations Administered    Name Date Dose VIS Date Route   Pfizer COVID-19 Vaccine 03/30/2019  1:38 PM 0.3 mL 12/18/2018 Intramuscular   Manufacturer: [place]   Lot: [id number]   [place]: [address]-7

## 2019-10-12 ENCOUNTER — Ambulatory Visit: Payer: Medicare HMO

## 2019-12-23 ENCOUNTER — Other Ambulatory Visit: Payer: Self-pay

## 2019-12-23 ENCOUNTER — Encounter: Payer: Self-pay | Admitting: Plastic Surgery

## 2019-12-23 ENCOUNTER — Ambulatory Visit (INDEPENDENT_AMBULATORY_CARE_PROVIDER_SITE_OTHER): Payer: Medicare HMO | Admitting: Plastic Surgery

## 2019-12-23 VITALS — BP 122/73 | HR 104 | Temp 98.2°F | Ht 74.0 in | Wt 287.6 lb

## 2019-12-23 DIAGNOSIS — L910 Hypertrophic scar: Secondary | ICD-10-CM

## 2019-12-23 NOTE — Progress Notes (Signed)
Referring Provider Sonia Side., FNP [place],  [place] [address]   CC:  Chief Complaint  Patient presents with   Advice Only      Jeremy Landry is an 74 y.o. male.  HPI: Patient presents to discuss the keloid on his right neck.  Is been present for a number of years.  It is growing and painful.  He is interested in having it excised.  Allergies  Allergen Reactions   Aspirin Anaphylaxis   Shellfish Allergy Anaphylaxis   Other Hives    RAISINS   Peanut-Containing Drug Products Hives   Lisinopril     UNSPECIFIED REACTION     Outpatient Encounter Medications as of 12/23/2019  Medication Sig   albuterol (VENTOLIN HFA) 108 (90 Base) MCG/ACT inhaler Inhale 2 puffs into the lungs every 6 (six) hours as needed for wheezing or shortness of breath.   atorvastatin (LIPITOR) 40 MG tablet Take 40 mg by mouth daily.   brimonidine (ALPHAGAN P) 0.1 % SOLN Place 1 drop into both eyes 2 (two) times daily.   carboxymethylcellulose (REFRESH PLUS) 0.5 % SOLN Place 1 drop into both eyes 3 (three) times daily as needed (dry eyes).   Cholecalciferol 50 MCG (2000 UT) TABS Take 2,000 Units by mouth daily.   clopidogrel (PLAVIX) 75 MG tablet Take 75 mg by mouth daily.   escitalopram (LEXAPRO) 10 MG tablet Take 10 mg by mouth daily.   finasteride (PROSCAR) 5 MG tablet Take 5 mg by mouth daily.   nitroGLYCERIN (NITROSTAT) 0.4 MG SL tablet Place 0.4 mg under the tongue every 5 (five) minutes as needed for chest pain.   omeprazole (PRILOSEC) 20 MG capsule Take 20 mg by mouth daily.   oxyCODONE (OXY IR/ROXICODONE) 5 MG immediate release tablet Take 1 tablet (5 mg total) by mouth every 6 (six) hours as needed for severe pain.   OXYGEN Inhale 3-4 L/min into the lungs as needed (short of breath).    prazosin (MINIPRESS) 1 MG capsule Take 1 mg by mouth at bedtime.   terazosin (HYTRIN) 5 MG capsule Take 5 mg by mouth at bedtime.   traZODone (DESYREL) 50 MG tablet Take 50  mg by mouth at bedtime as needed for sleep.   No facility-administered encounter medications on file as of 12/23/2019.     Past Medical History:  Diagnosis Date   Alcoholism ([place])    Anxiety    Arthritis    Chronic headaches    Congestive heart failure (HCC)    COPD (chronic obstructive pulmonary disease) (HCC)    Depression    GERD (gastroesophageal reflux disease)    Glaucoma    History of kidney stones    Hypertension    Kidney stones    Lung cancer ([place])    Pneumonia    Prostate cancer ([place])    Stroke ([place])    " small stroke one time "   Wears glasses    Wears partial dentures     Past Surgical History:  Procedure Laterality Date   CERVICAL DISC SURGERY     COLONOSCOPY WITH PROPOFOL N/A 09/05/2015   Procedure: COLONOSCOPY WITH PROPOFOL;  Surgeon: Doran Stabler, MD;  Location: [facility];  Service: Gastroenterology;  Laterality: N/A;   LIPOMA EXCISION Left 10/28/2018   Procedure: EXCISION SUBCUTANEOUS LIPOMA LEFT SHOULDER;  Surgeon: Donnie Mesa, MD;  Location: [facility];  Service: General;  Laterality: Left;  [place]  x 3   RADIOLOGY WITH ANESTHESIA N/A 02/06/2017   Procedure: MRI OF THE BRAIN WITH AND WITHOUT CONTRAST;  Surgeon: Radiologist, Medication, MD;  Location: [facility];  Service: Radiology;  Laterality: N/A;    Family History  Problem Relation Age of Onset   Bone cancer Brother        in leg   Colon cancer Maternal Uncle     Social History   Social History Narrative   Not on file     Review of Systems General: Denies fevers, chills, weight loss CV: Denies chest pain, shortness of breath, palpitations  Physical Exam Vitals with BMI 12/23/2019 10/28/2018 10/28/2018  Height 6\' 2"  - -  Weight 287 lbs 10 oz - -  BMI 24.58 - -  Systolic 099 833 825  Diastolic 73 89 89  Pulse 053 - 79    General:  No acute distress,  Alert and oriented, Non-Toxic, Normal speech and affect Examination shows a 3 to 4  cm keloid in that right neck area in the superior lateral aspect.  Assessment/Plan Patient presents with a symptomatic keloid in the right neck.  I recommended excision.  We discussed the risks include bleeding, infection, damage surrounding structures need for additional procedures.  All of his questions were answered and we will plan to move forward.  I did specifically discussed the risk of recurrence and what we would do to manage that.  Cindra Presume 12/23/2019, 4:48 PM

## 2020-01-19 ENCOUNTER — Other Ambulatory Visit (HOSPITAL_COMMUNITY)
Admission: RE | Admit: 2020-01-19 | Discharge: 2020-01-19 | Disposition: A | Discharge status: Home / Self Care | Payer: Medicare HMO | Source: Ambulatory Visit | Attending: Plastic Surgery | Admitting: Plastic Surgery

## 2020-01-19 ENCOUNTER — Other Ambulatory Visit: Payer: Self-pay

## 2020-01-19 ENCOUNTER — Encounter: Payer: Self-pay | Admitting: Plastic Surgery

## 2020-01-19 ENCOUNTER — Ambulatory Visit (INDEPENDENT_AMBULATORY_CARE_PROVIDER_SITE_OTHER): Payer: Medicare HMO | Admitting: Plastic Surgery

## 2020-01-19 VITALS — BP 132/86 | HR 89 | Temp 97.7°F | Ht 74.0 in | Wt 280.0 lb

## 2020-01-19 DIAGNOSIS — L910 Hypertrophic scar: Secondary | ICD-10-CM | POA: Insufficient documentation

## 2020-01-19 MED ORDER — HYDROCODONE-ACETAMINOPHEN 5-325 MG PO TABS: 1.0000 | ORAL_TABLET | Freq: Four times a day (QID) | ORAL | 0 refills | Status: DC | PRN

## 2020-01-19 NOTE — Progress Notes (Signed)
Operative Note   DATE OF OPERATION: 01/19/2020  LOCATION:    SURGICAL DEPARTMENT: Plastic Surgery  PREOPERATIVE DIAGNOSES: Right neck keloid  POSTOPERATIVE DIAGNOSES:  same  PROCEDURE:  1. Excision of right neck keloid measuring 4 cm 2. Complex closure measuring 4 cm  SURGEON: Talmadge Coventry, MD  ANESTHESIA:  Local  COMPLICATIONS: None.   INDICATIONS FOR PROCEDURE:  The patient, Jeremy Landry is a 75 y.o. male born on [birth year], is here for treatment of right neck keloid MRN: [id number]  CONSENT:  Informed consent was obtained directly from the patient. Risks, benefits and alternatives were fully discussed. Specific risks including but not limited to bleeding, infection, hematoma, seroma, scarring, pain, infection, wound healing problems, and need for further surgery were all discussed. The patient did have an ample opportunity to have questions answered to satisfaction.   DESCRIPTION OF PROCEDURE:  Local anesthesia was administered. The patient's operative site was prepped and draped in a sterile fashion. A time out was performed and all information was confirmed to be correct.  The lesion was excised with a 15 blade.  Hemostasis was obtained.  Circumferential undermining was performed and the skin was advanced and closed in layers with interrupted buried Monocryl sutures and 5-0 fast gut for the skin.  The lesion excised measured 4 cm, and the total length of closure measured 4 cm.    The patient tolerated the procedure well.  There were no complications.

## 2020-01-19 NOTE — Addendum Note (Signed)
Addended by: Cindra Presume on: 01/19/2020 12:22 PM   Modules accepted: Orders

## 2020-01-24 LAB — SURGICAL PATHOLOGY

## 2020-02-22 ENCOUNTER — Encounter (HOSPITAL_COMMUNITY): Payer: Self-pay | Admitting: *Deleted

## 2020-02-22 NOTE — Progress Notes (Signed)
Received [facility] Authorization [id number] from Dr. Mali Marion at the [facility]  for this pt to participate in pulmonary rehab with the the diagnosis of Dyspnea on Exertion. This pt was previously referred last year but was unable to attend due to departmental closure for Covid. Clinical review of pt follow up appt on 02/15/20 Pulmonary office note.  Pt with Covid Risk Score - 5. Pt appropriate for scheduling for Pulmonary rehab.  Will forward to support staff for scheduling. Cherre Huger, BSN Cardiac and Training and development officer

## 2020-02-24 ENCOUNTER — Telehealth (HOSPITAL_COMMUNITY): Payer: Self-pay | Admitting: Family

## 2020-03-09 ENCOUNTER — Telehealth (HOSPITAL_COMMUNITY): Payer: Self-pay | Admitting: Family

## 2020-03-14 ENCOUNTER — Telehealth (HOSPITAL_COMMUNITY): Payer: Self-pay

## 2020-03-14 NOTE — Telephone Encounter (Signed)
Called and spoke with pt in regards to PR, pt stated he is interested. He will come in for PR orientation 4/18 @ 9AM and will attend our 1045AM class. Went over insurance, patient verbalized understanding.   Mailed letter

## 2020-04-20 ENCOUNTER — Telehealth (HOSPITAL_COMMUNITY): Payer: Self-pay | Admitting: *Deleted

## 2020-04-24 ENCOUNTER — Encounter (HOSPITAL_COMMUNITY)
Admission: RE | Admit: 2020-04-24 | Discharge: 2020-04-24 | Disposition: A | Discharge status: Home / Self Care | Payer: No Typology Code available for payment source | Source: Ambulatory Visit | Attending: Cardiology | Admitting: Cardiology

## 2020-04-24 ENCOUNTER — Other Ambulatory Visit: Payer: Self-pay

## 2020-04-24 VITALS — BP 140/82 | HR 75 | Ht 74.0 in | Wt 293.7 lb

## 2020-04-24 DIAGNOSIS — R0600 Dyspnea, unspecified: Secondary | ICD-10-CM | POA: Insufficient documentation

## 2020-04-24 DIAGNOSIS — R0609 Other forms of dyspnea: Secondary | ICD-10-CM

## 2020-04-24 NOTE — Progress Notes (Signed)
Jeremy Landry 75 y.o. male Pulmonary Rehab Orientation Note This patient who was referred to Pulmonary rehab by Dr. Mali Marion from the [facility] in [place] with the diagnosis of dyspnea on exertion arrived today in Cardiac and Pulmonary Rehab. He arrived ambulatory with unsteady gait. He reports using a cane or walker intermittently at home, but did not arrive with any assist devices today. He does not carry portable oxygen. He states that he had supplemental oxygen through the [place], but it was taken away recently.  He reports having used 2-3L/min, and this is the 2nd time he has had the oxygen removed from his use.  Today he desaturated from 92% to 88% just from standing up and was placed on 1L and after walking 1 minute he desaturated to 86% and it was increased to 2 L/min to complete the 6 minute walk test.  Color good, skin warm and dry. Patient is oriented to time and place. Patient's medical history, psychosocial health, and medications reviewed. Psychosocial assessment reveals pt lives with his brother. Pt is currently 100% disabled due to exposure to agent orange while serving in the [place] [employer]. Pt reports his stress level is moderate. Areas of stress/anxiety include Health Finances and his inability to perform activities he used to do or enjoy.  Pt does exhibit signs of depression. Signs of depression include anxiety and sadness. PHQ2/9 score 2/5. Kerby takes Lexapro for depression and talks with a therapist every 3 months through the [place] and feels the treatment does help control his depression and anxiety. Pt shows fair  coping skills with positive outlook .  Offered emotional support and reassurance. Will continue to monitor and evaluate progress toward psychosocial goal(s) of continued maintenance of depression and anxiety. Physical assessment reveals heart rate is normal, breath sounds clear to auscultation, no wheezes, rales, or rhonchi.  Posterior tibial pulses 3+ and present without  peripheral edema . Patient reports he does take medications as prescribed. Patient states he follows a Regular diet. The patient reports no specific efforts to gain or lose weight.. Patient's weight will be monitored closely. Demonstration and practice of PLB using pulse oximeter. Patient able to return demonstration satisfactorily. Safety and hand hygiene in the exercise area reviewed with patient. Patient voices understanding of the information reviewed. Department expectations discussed with patient and achievable goals were set. The patient shows enthusiasm about attending the program and we look forward to working with this nice gentleman. The patient is completed a 6 min walk test today, 04/24/2020 and to begin exercise on 05/02/2020 in the 1045 slot. Also, [name]'s BP was elevated during the walk test, 175/106 and reports that he took his BP medications today.  It did return to 170/90 when leaving the department.  [name] has an appointment with his [place] PCP tomorrow and was given a recording of these elevated BP readings for review and further instructions. [id number]

## 2020-04-24 NOTE — Progress Notes (Signed)
Pulmonary Individual Treatment Plan  Patient Details  Name: Jeremy Landry MRN: [id number] Date of Birth: [birth year] Referring Provider:   [name] Pulmonary Rehab Walk Test from 04/24/2020 in [place]  Referring Provider Dr. Radford Pax ([place])      Initial Encounter Date:  Flowsheet Row Pulmonary Rehab Walk Test from 04/24/2020 in [facility]  Date 04/24/20      Visit Diagnosis: Dyspnea on exertion  Patient's Home Medications on Admission:   Current Outpatient Medications:  .  albuterol (VENTOLIN HFA) 108 (90 Base) MCG/ACT inhaler, Inhale 2 puffs into the lungs every 6 (six) hours as needed for wheezing or shortness of breath., Disp: , Rfl:  .  atorvastatin (LIPITOR) 40 MG tablet, Take 40 mg by mouth daily., Disp: , Rfl:  .  brimonidine (ALPHAGAN P) 0.1 % SOLN, Place 1 drop into both eyes 2 (two) times daily., Disp: , Rfl:  .  carboxymethylcellulose (REFRESH PLUS) 0.5 % SOLN, Place 1 drop into both eyes 3 (three) times daily as needed (dry eyes)., Disp: , Rfl:  .  Cholecalciferol 50 MCG (2000 UT) TABS, Take 2,000 Units by mouth daily., Disp: , Rfl:  .  clopidogrel (PLAVIX) 75 MG tablet, Take 75 mg by mouth daily., Disp: , Rfl:  .  escitalopram (LEXAPRO) 10 MG tablet, Take 10 mg by mouth daily., Disp: , Rfl:  .  finasteride (PROSCAR) 5 MG tablet, Take 5 mg by mouth daily., Disp: , Rfl:  .  HYDROcodone-acetaminophen (NORCO) 5-325 MG tablet, Take 1 tablet by mouth every 6 (six) hours as needed for moderate pain., Disp: 5 tablet, Rfl: 0 .  nitroGLYCERIN (NITROSTAT) 0.4 MG SL tablet, Place 0.4 mg under the tongue every 5 (five) minutes as needed for chest pain., Disp: , Rfl:  .  omeprazole (PRILOSEC) 20 MG capsule, Take 20 mg by mouth daily., Disp: , Rfl:  .  oxyCODONE (OXY IR/ROXICODONE) 5 MG immediate release tablet, Take 1 tablet (5 mg total) by mouth every 6 (six) hours as needed for severe pain., Disp: 15 tablet, Rfl: 0 .   OXYGEN, Inhale 3-4 L/min into the lungs as needed (short of breath). , Disp: , Rfl:  .  prazosin (MINIPRESS) 1 MG capsule, Take 1 mg by mouth at bedtime., Disp: , Rfl:  .  terazosin (HYTRIN) 5 MG capsule, Take 5 mg by mouth at bedtime., Disp: , Rfl:  .  traZODone (DESYREL) 50 MG tablet, Take 50 mg by mouth at bedtime as needed for sleep., Disp: , Rfl:   Past Medical History: Past Medical History:  Diagnosis Date  . Alcoholism ([place])   . Anxiety   . Arthritis   . Chronic headaches   . Congestive heart failure ([place])   . COPD (chronic obstructive pulmonary disease) ([place])   . Depression   . GERD (gastroesophageal reflux disease)   . Glaucoma   . History of kidney stones   . Hypertension   . Kidney stones   . Lung cancer ([place])   . Pneumonia   . Prostate cancer ([place])   . Stroke [facility])    " small stroke one time "  . Wears glasses   . Wears partial dentures     Tobacco Use: Social History   Tobacco Use  Smoking Status Former Smoker  . Packs/day: 1.00  . Years: 40.00  . Pack years: 40.00  . Quit date: 07/23/2014  . Years since quitting: 5.7  Smokeless Tobacco Never Used  Labs: Recent Review Flowsheet Data    Labs for ITP Cardiac and Pulmonary Rehab Latest Ref Rng & Units 05/24/2010 08/12/2014 08/14/2014 10/21/2014   Cholestrol 0 - 200 mg/dL 121 156 165 -   LDLCALC 0 - 99 mg/dL 53 ... 92 109(H) -   HDL >40 mg/dL 38(L) 31(L) 41 -   Trlycerides <150 mg/dL 152(H) 167(H) 75 -   Hemoglobin A1c 4.8 - 5.6 % 5.1 ... 5.7(H) 5.5 -   PHART 7.350 - 7.450 - - - 7.410   PCO2ART 35.0 - 45.0 mmHg - - - 39.9   HCO3 20.0 - 24.0 mEq/L - - - 25.4(H)   TCO2 0 - 100 mmol/L - - - 27   O2SAT % - - - 93.0      Capillary Blood Glucose: Lab Results  Component Value Date   GLUCAP 110 (H) 01/30/2017   GLUCAP 151 (H) 08/16/2014   GLUCAP 119 (H) 08/15/2014   GLUCAP 148 (H) 08/14/2014   GLUCAP 144 (H) 08/13/2014     Pulmonary Assessment Scores:  Pulmonary Assessment Scores    Row Name  04/24/20 1215 04/24/20 1226       ADL UCSD   ADL Phase Entry Entry    SOB Score total -- 84         CAT Score   CAT Score -- 32         mMRC Score   mMRC Score 4 --          UCSD: Self-administered rating of dyspnea associated with activities of daily living (ADLs) 6-point scale (0 = "not at all" to 5 = "maximal or unable to do because of breathlessness")  Scoring Scores range from 0 to 120.  Minimally important difference is 5 units  CAT: CAT can identify the health impairment of COPD patients and is better correlated with disease progression.  CAT has a scoring range of zero to 40. The CAT score is classified into four groups of low (less than 10), medium (10 - 20), high (21-30) and very high (31-40) based on the impact level of disease on health status. A CAT score over 10 suggests significant symptoms.  A worsening CAT score could be explained by an exacerbation, poor medication adherence, poor inhaler technique, or progression of COPD or comorbid conditions.  CAT MCID is 2 points  mMRC: mMRC (Modified Medical Research Council) Dyspnea Scale is used to assess the degree of baseline functional disability in patients of respiratory disease due to dyspnea. No minimal important difference is established. A decrease in score of 1 point or greater is considered a positive change.   Pulmonary Function Assessment:  Pulmonary Function Assessment - 04/24/20 0959      Breath   Bilateral Breath Sounds Clear;Decreased    Shortness of Breath Yes;Limiting activity           Exercise Target Goals: Exercise Program Goal: Individual exercise prescription set using results from initial 6 min walk test and THRR while considering  patient's activity barriers and safety.   Exercise Prescription Goal: Initial exercise prescription builds to 30-45 minutes a day of aerobic activity, 2-3 days per week.  Home exercise guidelines will be given to patient during program as part of exercise  prescription that the participant will acknowledge.  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 Pt used wheelchair for assistance and was still very unsteady     Resting HR 73 bpm     Resting BP 144/90     Resting Oxygen Saturation  92 %     Exercise Oxygen Saturation  during 6 min walk 86 %     Max Ex. HR 97 bpm     Max Ex. BP 170/100     2 Minute Post BP 175/106           Interval HR   1 Minute HR 97     2 Minute HR 94     3 Minute HR 89     4 Minute HR 85     5 Minute HR 76     6 Minute HR 93     2 Minute Post HR 79     Interval Heart Rate? Yes           Interval Oxygen   Interval Oxygen? Yes     Baseline Oxygen Saturation % 92 %     1 Minute Oxygen Saturation % 86 %     1 Minute Liters of Oxygen 1 L  Increased to 2L     2 Minute Oxygen Saturation % 88 %     2 Minute Liters of Oxygen 2 L     3 Minute Oxygen Saturation % 92 %     3 Minute Liters of Oxygen 2 L     4 Minute Oxygen Saturation % 94 %     4 Minute Liters of Oxygen 2 L     5 Minute Oxygen Saturation % 96 %     5 Minute Liters of Oxygen 2 L     6 Minute Oxygen Saturation % 91 %     6 Minute Liters of Oxygen 2 L     2 Minute Post Oxygen Saturation % 93 %     2 Minute Post Liters of Oxygen 2 L            Oxygen Initial Assessment:  Oxygen Initial Assessment - 04/24/20 0959      Home Oxygen   Home Oxygen Device None    Sleep Oxygen Prescription  None    Home Exercise Oxygen Prescription None    Home Resting Oxygen Prescription None    Compliance with Home Oxygen Use --   Pt's oxygen got taken from him 2 weeks ago due to the [place] stating he does not need it anymore     Initial 6 min Walk   Oxygen Used Continuous    Liters per minute 2      Program Oxygen Prescription   Program Oxygen Prescription Continuous    Liters per minute 2    Comments Pt sats dropped to 88% on RA  just from standing up from chair. Pt needs to be on supplemental oxygen with exertion and maybe even at rest.      Intervention   Short Term Goals To learn and exhibit compliance with exercise, home and travel O2 prescription;To learn and understand importance of monitoring SPO2 with pulse oximeter and demonstrate accurate use of the pulse oximeter.;To learn and understand importance of maintaining oxygen saturations>88%;To learn and demonstrate proper pursed lip breathing techniques or other breathing techniques.;To learn and demonstrate proper use of respiratory medications    Long  Term Goals Exhibits compliance with exercise, home and travel O2 prescription;Verbalizes importance of monitoring SPO2 with pulse oximeter and return demonstration;Maintenance of O2 saturations>88%;Exhibits proper breathing techniques, such as pursed lip breathing or other method taught during program session;Compliance with respiratory medication;Demonstrates proper use of MDI's           Oxygen Re-Evaluation:   Oxygen Discharge (Final Oxygen Re-Evaluation):   Initial Exercise Prescription:  Initial Exercise Prescription - 04/24/20 1200      Date of Initial Exercise RX and Referring Provider   Date 04/24/20    Referring Provider Dr. Radford Pax ([place])    Expected Discharge Date 06/29/20      Oxygen   Oxygen Continuous    Liters 2      NuStep   Level 1    SPM 75    Minutes 30      Prescription Details   Frequency (times per week) 2    Duration Progress to 30 minutes of  continuous aerobic without signs/symptoms of physical distress      Intensity   THRR 40-80% of Max Heartrate 58-117    Ratings of Perceived Exertion 11-13    Perceived Dyspnea 0-4      Progression   Progression Continue to progress workloads to maintain intensity without signs/symptoms of physical distress.      Resistance Training   Training Prescription Yes    Weight Red bands    Reps 10-15           Perform Capillary Blood Glucose checks as needed.  Exercise Prescription Changes:   Exercise Comments:   Exercise Goals and Review:  Exercise Goals    Row Name 04/24/20 1212             Exercise Goals   Increase Physical Activity Yes       Intervention Provide advice, education, support and counseling about physical activity/exercise needs.;Develop an individualized exercise prescription for aerobic and resistive training based on initial evaluation findings, risk stratification, comorbidities and participant's personal goals.       Expected Outcomes Short Term: Attend rehab on a regular basis to increase amount of physical activity.;Long Term: Add in home exercise to make exercise part of routine and to increase amount of physical activity.;Long Term: Exercising regularly at least 3-5 days a week.       Increase Strength and Stamina Yes       Intervention Provide advice, education, support and counseling about physical activity/exercise needs.;Develop an individualized exercise prescription for aerobic and resistive training based on initial evaluation findings, risk stratification, comorbidities and participant's personal goals.       Expected Outcomes Short Term: Increase workloads from initial exercise prescription for resistance, speed, and METs.;Short Term: Perform resistance training exercises routinely during rehab and add in resistance training at home;Long Term: Improve cardiorespiratory fitness, muscular endurance and strength as measured by increased METs and  functional capacity (6MWT)       Able to  understand and use rate of perceived exertion (RPE) scale Yes       Intervention Provide education and explanation on how to use RPE scale       Expected Outcomes Short Term: Able to use RPE daily in rehab to express subjective intensity level;Long Term:  Able to use RPE to guide intensity level when exercising independently       Able to understand and use Dyspnea scale Yes       Intervention Provide education and explanation on how to use Dyspnea scale       Expected Outcomes Short Term: Able to use Dyspnea scale daily in rehab to express subjective sense of shortness of breath during exertion;Long Term: Able to use Dyspnea scale to guide intensity level when exercising independently       Knowledge and understanding of Target Heart Rate Range (THRR) Yes       Intervention Provide education and explanation of THRR including how the numbers were predicted and where they are located for reference       Expected Outcomes Short Term: Able to state/look up THRR;Long Term: Able to use THRR to govern intensity when exercising independently;Short Term: Able to use daily as guideline for intensity in rehab       Understanding of Exercise Prescription Yes       Intervention Provide education, explanation, and written materials on patient's individual exercise prescription       Expected Outcomes Short Term: Able to explain program exercise prescription;Long Term: Able to explain home exercise prescription to exercise independently              Exercise Goals Re-Evaluation :   Discharge Exercise Prescription (Final Exercise Prescription Changes):   Nutrition:  Target Goals: Understanding of nutrition guidelines, daily intake of sodium 1500mg , cholesterol 200mg , calories 30% from fat and 7% or less from saturated fats, daily to have 5 or more servings of fruits and vegetables.  Biometrics:  Pre Biometrics - 04/24/20 0914      Pre Biometrics   Height 6'  2" (1.88 m)    Weight 133.2 kg    BMI (Calculated) 37.69    Grip Strength 25 kg            Nutrition Therapy Plan and Nutrition Goals:   Nutrition Assessments:  MEDIFICTS Score Key:  ?70 Need to make dietary changes   40-70 Heart Healthy Diet  ? 40 Therapeutic Level Cholesterol Diet   Picture Your Plate Scores:  <62 Unhealthy dietary pattern with much room for improvement.  41-50 Dietary pattern unlikely to meet recommendations for good health and room for improvement.  51-60 More healthful dietary pattern, with some room for improvement.   >60 Healthy dietary pattern, although there may be some specific behaviors that could be improved.    Nutrition Goals Re-Evaluation:   Nutrition Goals Discharge (Final Nutrition Goals Re-Evaluation):   Psychosocial: Target Goals: Acknowledge presence or absence of significant depression and/or stress, maximize coping skills, provide positive support system. Participant is able to verbalize types and ability to use techniques and skills needed for reducing stress and depression.  Initial Review & Psychosocial Screening:  Initial Psych Review & Screening - 04/24/20 1012      Initial Review   Current issues with Current Depression;History of Depression;Current Anxiety/Panic;Current Stress Concerns    Source of Stress Concerns Chronic Illness;Unable to perform yard/household activities;Unable to participate in former interests or hobbies;Retirement/disability    Comments Disabled from exposure to agent orange  Family Dynamics   Good Support System? Yes   brother     Barriers   Psychosocial barriers to participate in program The patient should benefit from training in stress management and relaxation.      Screening Interventions   Interventions Encouraged to exercise    Expected Outcomes Short Term goal: Utilizing psychosocial counselor, staff and physician to assist with identification of specific Stressors or current  issues interfering with healing process. Setting desired goal for each stressor or current issue identified.;Long Term Goal: Stressors or current issues are controlled or eliminated.           Quality of Life Scores:  Scores of 19 and below usually indicate a poorer quality of life in these areas.  A difference of  2-3 points is a clinically meaningful difference.  A difference of 2-3 points in the total score of the Quality of Life Index has been associated with significant improvement in overall quality of life, self-image, physical symptoms, and general health in studies assessing change in quality of life.  PHQ-9: Recent Review Flowsheet Data    Depression screen [facility] 2/9 04/24/2020   Decreased Interest 1   Down, Depressed, Hopeless 1   PHQ - 2 Score 2   Altered sleeping 0   Tired, decreased energy 3   Change in appetite 0   Feeling bad or failure about yourself  0   Trouble concentrating 0   Moving slowly or fidgety/restless 0   Suicidal thoughts 0   PHQ-9 Score 5   Difficult doing work/chores Somewhat difficult     Interpretation of Total Score  Total Score Depression Severity:  1-4 = Minimal depression, 5-9 = Mild depression, 10-14 = Moderate depression, 15-19 = Moderately severe depression, 20-27 = Severe depression   Psychosocial Evaluation and Intervention:  Psychosocial Evaluation - 04/24/20 1014      Psychosocial Evaluation & Interventions   Interventions Encouraged to exercise with the program and follow exercise prescription;Stress management education;Relaxation education    Comments Sees a therapist every 3 months, is on lexapro    Expected Outcomes Ability to handle psychosocial issues in healthy ways    Continue Psychosocial Services  Follow up required by staff           Psychosocial Re-Evaluation:  Psychosocial Re-Evaluation    [place] Name 04/24/20 1015             Psychosocial Re-Evaluation   Current issues with Current Depression;History of  Depression;Current Stress Concerns;Current Anxiety/Panic       Interventions Relaxation education;Stress management education;Encouraged to attend Pulmonary Rehabilitation for the exercise       Continue Psychosocial Services  Follow up required by staff               Initial Review   Source of Stress Concerns Chronic Illness;Unable to perform yard/household activities;Unable to participate in former interests or hobbies;Retirement/disability              Psychosocial Discharge (Final Psychosocial Re-Evaluation):  Psychosocial Re-Evaluation - 04/24/20 1015      Psychosocial Re-Evaluation   Current issues with Current Depression;History of Depression;Current Stress Concerns;Current Anxiety/Panic    Interventions Relaxation education;Stress management education;Encouraged to attend Pulmonary Rehabilitation for the exercise    Continue Psychosocial Services  Follow up required by staff      Initial Review   Source of Stress Concerns Chronic Illness;Unable to perform yard/household activities;Unable to participate in former interests or hobbies;Retirement/disability  Education: Education Goals: Education classes will be provided on a weekly basis, covering required topics. Participant will state understanding/return demonstration of topics presented.  Learning Barriers/Preferences:  Learning Barriers/Preferences - 04/24/20 1015      Learning Barriers/Preferences   Learning Barriers None    Learning Preferences Written Material;Verbal Instruction;Video;Skilled Demonstration;Pictoral;Individual Instruction;Group Instruction;Audio           Education Topics: Risk Factor Reduction:  -Group instruction that is supported by a PowerPoint presentation. Instructor discusses the definition of a risk factor, different risk factors for pulmonary disease, and how the heart and lungs work together.     Nutrition for Pulmonary Patient:  -Group instruction provided by PowerPoint  slides, verbal discussion, and written materials to support subject matter. The instructor gives an explanation and review of healthy diet recommendations, which includes a discussion on weight management, recommendations for fruit and vegetable consumption, as well as protein, fluid, caffeine, fiber, sodium, sugar, and alcohol. Tips for eating when patients are short of breath are discussed.   Pursed Lip Breathing:  -Group instruction that is supported by demonstration and informational handouts. Instructor discusses the benefits of pursed lip and diaphragmatic breathing and detailed demonstration on how to preform both.     Oxygen Safety:  -Group instruction provided by PowerPoint, verbal discussion, and written material to support subject matter. There is an overview of "What is Oxygen" and "Why do we need it".  Instructor also reviews how to create a safe environment for oxygen use, the importance of using oxygen as prescribed, and the risks of noncompliance. There is a brief discussion on traveling with oxygen and resources the patient may utilize.   Oxygen Equipment:  -Group instruction provided by [facility] Staff utilizing handouts, written materials, and equipment demonstrations.   Signs and Symptoms:  -Group instruction provided by written material and verbal discussion to support subject matter. Warning signs and symptoms of infection, stroke, and heart attack are reviewed and when to call the physician/911 reinforced. Tips for preventing the spread of infection discussed.   Advanced Directives:  -Group instruction provided by verbal instruction and written material to support subject matter. Instructor reviews Advanced Directive laws and proper instruction for filling out document.   Pulmonary Video:  -Group video education that reviews the importance of medication and oxygen compliance, exercise, good nutrition, pulmonary hygiene, and pursed lip and diaphragmatic breathing for  the pulmonary patient.   Exercise for the Pulmonary Patient:  -Group instruction that is supported by a PowerPoint presentation. Instructor discusses benefits of exercise, core components of exercise, frequency, duration, and intensity of an exercise routine, importance of utilizing pulse oximetry during exercise, safety while exercising, and options of places to exercise outside of rehab.     Pulmonary Medications:  -Verbally interactive group education provided by instructor with focus on inhaled medications and proper administration.   Anatomy and Physiology of the Respiratory System and Intimacy:  -Group instruction provided by PowerPoint, verbal discussion, and written material to support subject matter. Instructor reviews respiratory cycle and anatomical components of the respiratory system and their functions. Instructor also reviews differences in obstructive and restrictive respiratory diseases with examples of each. Intimacy, Sex, and Sexuality differences are reviewed with a discussion on how relationships can change when diagnosed with pulmonary disease. Common sexual concerns are reviewed.   MD DAY -A group question and answer session with a medical doctor that allows participants to ask questions that relate to their pulmonary disease state.   OTHER EDUCATION -Group or individual verbal, written,  or video instructions that support the educational goals of the pulmonary rehab program.   Holiday Eating Survival Tips:  -Group instruction provided by PowerPoint slides, verbal discussion, and written materials to support subject matter. The instructor gives patients tips, tricks, and techniques to help them not only survive but enjoy the holidays despite the onslaught of food that accompanies the holidays.   Knowledge Questionnaire Score:  Knowledge Questionnaire Score - 04/24/20 1227      Knowledge Questionnaire Score   Pre Score 14/18           Core Components/Risk  Factors/Patient Goals at Admission:  Personal Goals and Risk Factors at Admission - 04/24/20 1015      Core Components/Risk Factors/Patient Goals on Admission   Improve shortness of breath with ADL's Yes    Intervention Provide education, individualized exercise plan and daily activity instruction to help decrease symptoms of SOB with activities of daily living.    Expected Outcomes Short Term: Improve cardiorespiratory fitness to achieve a reduction of symptoms when performing ADLs;Long Term: Be able to perform more ADLs without symptoms or delay the onset of symptoms           Core Components/Risk Factors/Patient Goals Review:   Goals and Risk Factor Review    Row Name 04/24/20 1016             Core Components/Risk Factors/Patient Goals Review   Personal Goals Review Develop more efficient breathing techniques such as purse lipped breathing and diaphragmatic breathing and practicing self-pacing with activity.;Increase knowledge of respiratory medications and ability to use respiratory devices properly.;Improve shortness of breath with ADL's              Core Components/Risk Factors/Patient Goals at Discharge (Final Review):   Goals and Risk Factor Review - 04/24/20 1016      Core Components/Risk Factors/Patient Goals Review   Personal Goals Review Develop more efficient breathing techniques such as purse lipped breathing and diaphragmatic breathing and practicing self-pacing with activity.;Increase knowledge of respiratory medications and ability to use respiratory devices properly.;Improve shortness of breath with ADL's           ITP Comments:   Comments:

## 2020-04-27 ENCOUNTER — Telehealth (HOSPITAL_COMMUNITY): Payer: Self-pay | Admitting: *Deleted

## 2020-04-27 NOTE — Telephone Encounter (Signed)
I spoke with a lady, she would not give me her name, from Respiratory Therapy @ the [facility] in [place].  She had done a qualifying walk test for supplemental oxygen recently, not sure of date.  She says patient does not need supplemental oxygen.  I tried to give her pulmonary rehab's 6 minute walk test information that was done with [place] 04/24/2020.  At rest he was 92% on RA, upon standing he dropped to 88%, after walking 1 minute he desaturated to 86%, was placed on 2 L/min.    Patient states he has been on oxygen 2 times before and the [facility] has taken it away.  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

## 2020-05-02 ENCOUNTER — Other Ambulatory Visit: Payer: Self-pay

## 2020-05-02 ENCOUNTER — Encounter (HOSPITAL_COMMUNITY)
Admission: RE | Admit: 2020-05-02 | Discharge: 2020-05-02 | Disposition: A | Discharge status: Home / Self Care | Payer: No Typology Code available for payment source | Source: Ambulatory Visit | Attending: Cardiology | Admitting: Cardiology

## 2020-05-02 DIAGNOSIS — R0609 Other forms of dyspnea: Secondary | ICD-10-CM

## 2020-05-02 DIAGNOSIS — R0600 Dyspnea, unspecified: Secondary | ICD-10-CM | POA: Diagnosis not present

## 2020-05-02 NOTE — Progress Notes (Signed)
Daily Session Note  Patient Details  Name: Jeremy Landry MRN: [id number] Date of Birth: 08/26/1945 Referring Provider:   [name] Pulmonary Rehab Walk Test from 04/24/2020 in [place]  Referring Provider Dr. Radford Pax ([place])      Encounter Date: 05/02/2020  Check In:  Session Check In - 05/02/20 1133      Check-In   Supervising physician immediately available to respond to emergencies [facility] immediately available    Physician(s) Dr. Tawanna Solo    Location [facility]    Staff Present Rosebud Poles, RN, BSN;Lisa Ysidro Evert, RN;Fantasha Daniele Hassell Done, MS, ACSM-CEP, Exercise Physiologist    Virtual Visit No    Medication changes reported     No    Fall or balance concerns reported    No    Tobacco Cessation No Change    Warm-up and Cool-down Performed on first and last piece of equipment    Resistance Training Performed Yes    VAD Patient? No    PAD/SET Patient? No      Pain Assessment   Currently in Pain? No/denies    Multiple Pain Sites No           Capillary Blood Glucose: No results found for this or any previous visit (from the past 24 hour(s)).    Social History   Tobacco Use  Smoking Status Former Smoker  . Packs/day: 1.00  . Years: 40.00  . Pack years: 40.00  . Quit date: 07/23/2014  . Years since quitting: 5.7  Smokeless Tobacco Never Used    Goals Met:  Proper associated with RPD/PD & O2 Sat Exercise tolerated well No report of cardiac concerns or symptoms Strength training completed today  Goals Unmet:  Not Applicable  Comments: Service time is from 1020 to 1135    Dr. Fransico Him is Medical Director for Cardiac Rehab at [facility].

## 2020-05-02 NOTE — Progress Notes (Signed)
.itpsPulmonary Individual Treatment Plan  Patient Details  Name: Jeremy Landry MRN: [id number] Date of Birth: [birth year] Referring Provider:   [name] Pulmonary Rehab Walk Test from 04/24/2020 in [facility]  Referring Provider Dr. Radford Pax ([place])      Initial Encounter Date:  Flowsheet Row Pulmonary Rehab Walk Test from 04/24/2020 in [facility]  Date 04/24/20      Visit Diagnosis: Dyspnea on exertion  Patient's Home Medications on Admission:   Current Outpatient Medications:  .  albuterol (VENTOLIN HFA) 108 (90 Base) MCG/ACT inhaler, Inhale 2 puffs into the lungs every 6 (six) hours as needed for wheezing or shortness of breath., Disp: , Rfl:  .  atorvastatin (LIPITOR) 40 MG tablet, Take 40 mg by mouth daily., Disp: , Rfl:  .  brimonidine (ALPHAGAN P) 0.1 % SOLN, Place 1 drop into both eyes 2 (two) times daily., Disp: , Rfl:  .  carboxymethylcellulose (REFRESH PLUS) 0.5 % SOLN, Place 1 drop into both eyes 3 (three) times daily as needed (dry eyes)., Disp: , Rfl:  .  Cholecalciferol 50 MCG (2000 UT) TABS, Take 2,000 Units by mouth daily., Disp: , Rfl:  .  clopidogrel (PLAVIX) 75 MG tablet, Take 75 mg by mouth daily., Disp: , Rfl:  .  escitalopram (LEXAPRO) 10 MG tablet, Take 10 mg by mouth daily., Disp: , Rfl:  .  finasteride (PROSCAR) 5 MG tablet, Take 5 mg by mouth daily., Disp: , Rfl:  .  HYDROcodone-acetaminophen (NORCO) 5-325 MG tablet, Take 1 tablet by mouth every 6 (six) hours as needed for moderate pain., Disp: 5 tablet, Rfl: 0 .  nitroGLYCERIN (NITROSTAT) 0.4 MG SL tablet, Place 0.4 mg under the tongue every 5 (five) minutes as needed for chest pain., Disp: , Rfl:  .  omeprazole (PRILOSEC) 20 MG capsule, Take 20 mg by mouth daily., Disp: , Rfl:  .  oxyCODONE (OXY IR/ROXICODONE) 5 MG immediate release tablet, Take 1 tablet (5 mg total) by mouth every 6 (six) hours as needed for severe pain., Disp: 15 tablet, Rfl:  0 .  OXYGEN, Inhale 3-4 L/min into the lungs as needed (short of breath). , Disp: , Rfl:  .  prazosin (MINIPRESS) 1 MG capsule, Take 1 mg by mouth at bedtime., Disp: , Rfl:  .  terazosin (HYTRIN) 5 MG capsule, Take 5 mg by mouth at bedtime., Disp: , Rfl:  .  traZODone (DESYREL) 50 MG tablet, Take 50 mg by mouth at bedtime as needed for sleep., Disp: , Rfl:   Past Medical History: Past Medical History:  Diagnosis Date  . Alcoholism ([place])   . Anxiety   . Arthritis   . Chronic headaches   . Congestive heart failure ([place])   . COPD (chronic obstructive pulmonary disease) ([place])   . Depression   . GERD (gastroesophageal reflux disease)   . Glaucoma   . History of kidney stones   . Hypertension   . Kidney stones   . Lung cancer ([place])   . Pneumonia   . Prostate cancer ([place])   . Stroke [facility])    " small stroke one time "  . Wears glasses   . Wears partial dentures     Tobacco Use: Social History   Tobacco Use  Smoking Status Former Smoker  . Packs/day: 1.00  . Years: 40.00  . Pack years: 40.00  . Quit date: 07/23/2014  . Years since quitting: 5.7  Smokeless Tobacco Never Used  Labs: Recent Review Flowsheet Data    Labs for ITP Cardiac and Pulmonary Rehab Latest Ref Rng & Units 05/24/2010 08/12/2014 08/14/2014 10/21/2014   Cholestrol 0 - 200 mg/dL 121 156 165 -   LDLCALC 0 - 99 mg/dL 53 ... 92 109(H) -   HDL >40 mg/dL 38(L) 31(L) 41 -   Trlycerides <150 mg/dL 152(H) 167(H) 75 -   Hemoglobin A1c 4.8 - 5.6 % 5.1 ... 5.7(H) 5.5 -   PHART 7.350 - 7.450 - - - 7.410   PCO2ART 35.0 - 45.0 mmHg - - - 39.9   HCO3 20.0 - 24.0 mEq/L - - - 25.4(H)   TCO2 0 - 100 mmol/L - - - 27   O2SAT % - - - 93.0      Capillary Blood Glucose: Lab Results  Component Value Date   GLUCAP 110 (H) 01/30/2017   GLUCAP 151 (H) 08/16/2014   GLUCAP 119 (H) 08/15/2014   GLUCAP 148 (H) 08/14/2014   GLUCAP 144 (H) 08/13/2014     Pulmonary Assessment Scores:  Pulmonary Assessment Scores    Row Name  04/24/20 1215 04/24/20 1226       ADL UCSD   ADL Phase Entry Entry    SOB Score total -- 84         CAT Score   CAT Score -- 32         mMRC Score   mMRC Score 4 --          UCSD: Self-administered rating of dyspnea associated with activities of daily living (ADLs) 6-point scale (0 = "not at all" to 5 = "maximal or unable to do because of breathlessness")  Scoring Scores range from 0 to 120.  Minimally important difference is 5 units  CAT: CAT can identify the health impairment of COPD patients and is better correlated with disease progression.  CAT has a scoring range of zero to 40. The CAT score is classified into four groups of low (less than 10), medium (10 - 20), high (21-30) and very high (31-40) based on the impact level of disease on health status. A CAT score over 10 suggests significant symptoms.  A worsening CAT score could be explained by an exacerbation, poor medication adherence, poor inhaler technique, or progression of COPD or comorbid conditions.  CAT MCID is 2 points  mMRC: mMRC (Modified Medical Research Council) Dyspnea Scale is used to assess the degree of baseline functional disability in patients of respiratory disease due to dyspnea. No minimal important difference is established. A decrease in score of 1 point or greater is considered a positive change.   Pulmonary Function Assessment:  Pulmonary Function Assessment - 04/24/20 0959      Breath   Bilateral Breath Sounds Clear;Decreased    Shortness of Breath Yes;Limiting activity           Exercise Target Goals: Exercise Program Goal: Individual exercise prescription set using results from initial 6 min walk test and THRR while considering  patient's activity barriers and safety.   Exercise Prescription Goal: Initial exercise prescription builds to 30-45 minutes a day of aerobic activity, 2-3 days per week.  Home exercise guidelines will be given to patient during program as part of exercise  prescription that the participant will acknowledge.  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 Pt used wheelchair for assistance and was still very unsteady     Resting HR 73 bpm     Resting BP 144/90     Resting Oxygen Saturation  92 %     Exercise Oxygen Saturation  during 6 min walk 86 %     Max Ex. HR 97 bpm     Max Ex. BP 170/100     2 Minute Post BP 175/106           Interval HR   1 Minute HR 97     2 Minute HR 94     3 Minute HR 89     4 Minute HR 85     5 Minute HR 76     6 Minute HR 93     2 Minute Post HR 79     Interval Heart Rate? Yes           Interval Oxygen   Interval Oxygen? Yes     Baseline Oxygen Saturation % 92 %     1 Minute Oxygen Saturation % 86 %     1 Minute Liters of Oxygen 1 L  Increased to 2L     2 Minute Oxygen Saturation % 88 %     2 Minute Liters of Oxygen 2 L     3 Minute Oxygen Saturation % 92 %     3 Minute Liters of Oxygen 2 L     4 Minute Oxygen Saturation % 94 %     4 Minute Liters of Oxygen 2 L     5 Minute Oxygen Saturation % 96 %     5 Minute Liters of Oxygen 2 L     6 Minute Oxygen Saturation % 91 %     6 Minute Liters of Oxygen 2 L     2 Minute Post Oxygen Saturation % 93 %     2 Minute Post Liters of Oxygen 2 L            Oxygen Initial Assessment:  Oxygen Initial Assessment - 04/24/20 0959      Home Oxygen   Home Oxygen Device None    Sleep Oxygen Prescription  None    Home Exercise Oxygen Prescription None    Home Resting Oxygen Prescription None    Compliance with Home Oxygen Use --   Pt's oxygen got taken from him 2 weeks ago due to the [place] stating he does not need it anymore     Initial 6 min Walk   Oxygen Used Continuous    Liters per minute 2      Program Oxygen Prescription   Program Oxygen Prescription Continuous    Liters per minute 2    Comments Pt sats dropped to 88% on RA  just from standing up from chair. Pt needs to be on supplemental oxygen with exertion and maybe even at rest.      Intervention   Short Term Goals To learn and exhibit compliance with exercise, home and travel O2 prescription;To learn and understand importance of monitoring SPO2 with pulse oximeter and demonstrate accurate use of the pulse oximeter.;To learn and understand importance of maintaining oxygen saturations>88%;To learn and demonstrate proper pursed lip breathing techniques or other breathing techniques.;To learn and demonstrate proper use of respiratory medications    Long  Term Goals Exhibits compliance with exercise, home and travel O2 prescription;Verbalizes importance of monitoring SPO2 with pulse oximeter and return demonstration;Maintenance of O2 saturations>88%;Exhibits proper breathing techniques, such as pursed lip breathing or other method taught during program session;Compliance with respiratory medication;Demonstrates proper use of MDI's           Oxygen Re-Evaluation:  Oxygen Re-Evaluation    Row Name 05/01/20 0901             Program Oxygen Prescription   Program Oxygen Prescription Continuous       Liters per minute 2       Comments Pt sats dropped to 88% on RA just from standing up from chair. Pt needs to be on supplemental oxygen with exertion and maybe even at rest.               Home Oxygen   Home Oxygen Device None       Sleep Oxygen Prescription None       Home Exercise Oxygen Prescription None       Home Resting Oxygen  Prescription None               Goals/Expected Outcomes   Short Term Goals To learn and exhibit compliance with exercise, home and travel O2 prescription;To learn and understand importance of monitoring SPO2 with pulse oximeter and demonstrate accurate use of the pulse oximeter.;To learn and understand importance of maintaining oxygen saturations>88%;To learn and demonstrate proper pursed lip breathing techniques or other breathing techniques.;To learn and demonstrate proper use of respiratory medications       Long  Term Goals Exhibits compliance with exercise, home and travel O2 prescription;Verbalizes importance of monitoring SPO2 with pulse oximeter and return demonstration;Maintenance of O2 saturations>88%;Exhibits proper breathing techniques, such as pursed lip breathing or other method taught during program session;Compliance with respiratory medication;Demonstrates proper use of MDI's       Comments We are working on getting pt access to supplemental oxygen for home needs and will continue to monitor his oxygen needs while at rehab.       Goals/Expected Outcomes Compliance and understanding of oxygen saturation and pursed lip breathing.              Oxygen Discharge (Final Oxygen Re-Evaluation):  Oxygen Re-Evaluation - 05/01/20 0901      Program Oxygen Prescription   Program Oxygen Prescription Continuous    Liters per minute 2    Comments Pt sats dropped to 88% on RA just from standing up from chair. Pt needs to be on supplemental oxygen with exertion and maybe even at rest.      Home Oxygen   Home Oxygen Device None    Sleep Oxygen Prescription None    Home Exercise Oxygen Prescription None    Home Resting Oxygen Prescription None      Goals/Expected Outcomes   Short Term Goals To learn and exhibit compliance with exercise, home and travel O2 prescription;To  learn and understand importance of monitoring SPO2 with pulse oximeter and demonstrate accurate use of the pulse  oximeter.;To learn and understand importance of maintaining oxygen saturations>88%;To learn and demonstrate proper pursed lip breathing techniques or other breathing techniques.;To learn and demonstrate proper use of respiratory medications    Long  Term Goals Exhibits compliance with exercise, home and travel O2 prescription;Verbalizes importance of monitoring SPO2 with pulse oximeter and return demonstration;Maintenance of O2 saturations>88%;Exhibits proper breathing techniques, such as pursed lip breathing or other method taught during program session;Compliance with respiratory medication;Demonstrates proper use of MDI's    Comments We are working on getting pt access to supplemental oxygen for home needs and will continue to monitor his oxygen needs while at rehab.    Goals/Expected Outcomes Compliance and understanding of oxygen saturation and pursed lip breathing.           Initial Exercise Prescription:  Initial Exercise Prescription - 04/24/20 1200      Date of Initial Exercise RX and Referring Provider   Date 04/24/20    Referring Provider Dr. Radford Pax ([place])    Expected Discharge Date 06/29/20      Oxygen   Oxygen Continuous    Liters 2      NuStep   Level 1    SPM 75    Minutes 30      Prescription Details   Frequency (times per week) 2    Duration Progress to 30 minutes of continuous aerobic without signs/symptoms of physical distress      Intensity   THRR 40-80% of Max Heartrate 58-117    Ratings of Perceived Exertion 11-13    Perceived Dyspnea 0-4      Progression   Progression Continue to progress workloads to maintain intensity without signs/symptoms of physical distress.      Resistance Training   Training Prescription Yes    Weight Red bands    Reps 10-15           Perform Capillary Blood Glucose checks as needed.  Exercise Prescription Changes:   Exercise Comments:   Exercise Goals and Review:  Exercise Goals    Row Name 04/24/20 1212              Exercise Goals   Increase Physical Activity Yes       Intervention Provide advice, education, support and counseling about physical activity/exercise needs.;Develop an individualized exercise prescription for aerobic and resistive training based on initial evaluation findings, risk stratification, comorbidities and participant's personal goals.       Expected Outcomes Short Term: Attend rehab on a regular basis to increase amount of physical activity.;Long Term: Add in home exercise to make exercise part of routine and to increase amount of physical activity.;Long Term: Exercising regularly at least 3-5 days a week.       Increase Strength and Stamina Yes       Intervention Provide advice, education, support and counseling about physical activity/exercise needs.;Develop an individualized exercise prescription for aerobic and resistive training based on initial evaluation findings, risk stratification, comorbidities and participant's personal goals.       Expected Outcomes Short Term: Increase workloads from initial exercise prescription for resistance, speed, and METs.;Short Term: Perform resistance training exercises routinely during rehab and add in resistance training at home;Long Term: Improve cardiorespiratory fitness, muscular endurance and strength as measured by increased METs and functional capacity (6MWT)       Able to understand and use rate of perceived exertion (RPE) scale Yes  Intervention Provide education and explanation on how to use RPE scale       Expected Outcomes Short Term: Able to use RPE daily in rehab to express subjective intensity level;Long Term:  Able to use RPE to guide intensity level when exercising independently       Able to understand and use Dyspnea scale Yes       Intervention Provide education and explanation on how to use Dyspnea scale       Expected Outcomes Short Term: Able to use Dyspnea scale daily in rehab to express subjective sense of  shortness of breath during exertion;Long Term: Able to use Dyspnea scale to guide intensity level when exercising independently       Knowledge and understanding of Target Heart Rate Range (THRR) Yes       Intervention Provide education and explanation of THRR including how the numbers were predicted and where they are located for reference       Expected Outcomes Short Term: Able to state/look up THRR;Long Term: Able to use THRR to govern intensity when exercising independently;Short Term: Able to use daily as guideline for intensity in rehab       Understanding of Exercise Prescription Yes       Intervention Provide education, explanation, and written materials on patient's individual exercise prescription       Expected Outcomes Short Term: Able to explain program exercise prescription;Long Term: Able to explain home exercise prescription to exercise independently              Exercise Goals Re-Evaluation :  Exercise Goals Re-Evaluation    Row Name 05/01/20 0900             Exercise Goal Re-Evaluation   Exercise Goals Review Increase Physical Activity;Increase Strength and Stamina;Able to understand and use rate of perceived exertion (RPE) scale;Able to understand and use Dyspnea scale;Knowledge and understanding of Target Heart Rate Range (THRR);Understanding of Exercise Prescription       Comments Pt is scheduled to start exercise this week. We will monitor and make changes as needed.       Expected Outcomes Through exercise at rehab and home, the patient will decrease shortness of breath with daily activities and feel confident in carrying out an exercise regimn at home.              Discharge Exercise Prescription (Final Exercise Prescription Changes):   Nutrition:  Target Goals: Understanding of nutrition guidelines, daily intake of sodium 1500mg , cholesterol 200mg , calories 30% from fat and 7% or less from saturated fats, daily to have 5 or more servings of fruits and  vegetables.  Biometrics:  Pre Biometrics - 04/24/20 0914      Pre Biometrics   Height 6\' 2"  (1.88 m)    Weight 133.2 kg    BMI (Calculated) 37.69    Grip Strength 25 kg            Nutrition Therapy Plan and Nutrition Goals:   Nutrition Assessments:  MEDIFICTS Score Key:  ?70 Need to make dietary changes   40-70 Heart Healthy Diet  ? 40 Therapeutic Level Cholesterol Diet   Picture Your Plate Scores:  <35 Unhealthy dietary pattern with much room for improvement.  41-50 Dietary pattern unlikely to meet recommendations for good health and room for improvement.  51-60 More healthful dietary pattern, with some room for improvement.   >60 Healthy dietary pattern, although there may be some specific behaviors that could be improved.  Nutrition Goals Re-Evaluation:   Nutrition Goals Discharge (Final Nutrition Goals Re-Evaluation):   Psychosocial: Target Goals: Acknowledge presence or absence of significant depression and/or stress, maximize coping skills, provide positive support system. Participant is able to verbalize types and ability to use techniques and skills needed for reducing stress and depression.  Initial Review & Psychosocial Screening:  Initial Psych Review & Screening - 04/24/20 1012      Initial Review   Current issues with Current Depression;History of Depression;Current Anxiety/Panic;Current Stress Concerns    Source of Stress Concerns Chronic Illness;Unable to perform yard/household activities;Unable to participate in former interests or hobbies;Retirement/disability    Comments Disabled from exposure to agent orange      [place]? Yes   brother     Barriers   Psychosocial barriers to participate in program The patient should benefit from training in stress management and relaxation.      Screening Interventions   Interventions Encouraged to exercise    Expected Outcomes Short Term goal: Utilizing psychosocial  counselor, staff and physician to assist with identification of specific Stressors or current issues interfering with healing process. Setting desired goal for each stressor or current issue identified.;Long Term Goal: Stressors or current issues are controlled or eliminated.           Quality of Life Scores:  Scores of 19 and below usually indicate a poorer quality of life in these areas.  A difference of  2-3 points is a clinically meaningful difference.  A difference of 2-3 points in the total score of the Quality of Life Index has been associated with significant improvement in overall quality of life, self-image, physical symptoms, and general health in studies assessing change in quality of life.  PHQ-9: Recent Review Flowsheet Data    Depression screen [facility] 2/9 04/24/2020   Decreased Interest 1   Down, Depressed, Hopeless 1   PHQ - 2 Score 2   Altered sleeping 0   Tired, decreased energy 3   Change in appetite 0   Feeling bad or failure about yourself  0   Trouble concentrating 0   Moving slowly or fidgety/restless 0   Suicidal thoughts 0   PHQ-9 Score 5   Difficult doing work/chores Somewhat difficult     Interpretation of Total Score  Total Score Depression Severity:  1-4 = Minimal depression, 5-9 = Mild depression, 10-14 = Moderate depression, 15-19 = Moderately severe depression, 20-27 = Severe depression   Psychosocial Evaluation and Intervention:  Psychosocial Evaluation - 04/24/20 1014      Psychosocial Evaluation & Interventions   Interventions Encouraged to exercise with the program and follow exercise prescription;Stress management education;Relaxation education    Comments Sees a therapist every 3 months, is on lexapro    Expected Outcomes Ability to handle psychosocial issues in healthy ways    Continue Psychosocial Services  Follow up required by staff           Psychosocial Re-Evaluation:  Psychosocial Re-Evaluation    [place] Name 04/24/20 1015              Psychosocial Re-Evaluation   Current issues with Current Depression;History of Depression;Current Stress Concerns;Current Anxiety/Panic       Interventions Relaxation education;Stress management education;Encouraged to attend Pulmonary Rehabilitation for the exercise       Continue Psychosocial Services  Follow up required by staff               Initial Review   Source  of Stress Concerns Chronic Illness;Unable to perform yard/household activities;Unable to participate in former interests or hobbies;Retirement/disability              Psychosocial Discharge (Final Psychosocial Re-Evaluation):  Psychosocial Re-Evaluation - 04/24/20 1015      Psychosocial Re-Evaluation   Current issues with Current Depression;History of Depression;Current Stress Concerns;Current Anxiety/Panic    Interventions Relaxation education;Stress management education;Encouraged to attend Pulmonary Rehabilitation for the exercise    Continue Psychosocial Services  Follow up required by staff      Initial Review   Source of Stress Concerns Chronic Illness;Unable to perform yard/household activities;Unable to participate in former interests or hobbies;Retirement/disability           Education: Education Goals: Education classes will be provided on a weekly basis, covering required topics. Participant will state understanding/return demonstration of topics presented.  Learning Barriers/Preferences:  Learning Barriers/Preferences - 04/24/20 1015      Learning Barriers/Preferences   Learning Barriers None    Learning Preferences Written Material;Verbal Instruction;Video;Skilled Demonstration;Pictoral;Individual Instruction;Group Instruction;Audio           Education Topics: Risk Factor Reduction:  -Group instruction that is supported by a PowerPoint presentation. Instructor discusses the definition of a risk factor, different risk factors for pulmonary disease, and how the heart and lungs work  together.     Nutrition for Pulmonary Patient:  -Group instruction provided by PowerPoint slides, verbal discussion, and written materials to support subject matter. The instructor gives an explanation and review of healthy diet recommendations, which includes a discussion on weight management, recommendations for fruit and vegetable consumption, as well as protein, fluid, caffeine, fiber, sodium, sugar, and alcohol. Tips for eating when patients are short of breath are discussed.   Pursed Lip Breathing:  -Group instruction that is supported by demonstration and informational handouts. Instructor discusses the benefits of pursed lip and diaphragmatic breathing and detailed demonstration on how to preform both.     Oxygen Safety:  -Group instruction provided by PowerPoint, verbal discussion, and written material to support subject matter. There is an overview of "What is Oxygen" and "Why do we need it".  Instructor also reviews how to create a safe environment for oxygen use, the importance of using oxygen as prescribed, and the risks of noncompliance. There is a brief discussion on traveling with oxygen and resources the patient may utilize.   Oxygen Equipment:  -Group instruction provided by [facility] Staff utilizing handouts, written materials, and equipment demonstrations.   Signs and Symptoms:  -Group instruction provided by written material and verbal discussion to support subject matter. Warning signs and symptoms of infection, stroke, and heart attack are reviewed and when to call the physician/911 reinforced. Tips for preventing the spread of infection discussed.   Advanced Directives:  -Group instruction provided by verbal instruction and written material to support subject matter. Instructor reviews Advanced Directive laws and proper instruction for filling out document.   Pulmonary Video:  -Group video education that reviews the importance of medication and oxygen compliance,  exercise, good nutrition, pulmonary hygiene, and pursed lip and diaphragmatic breathing for the pulmonary patient.   Exercise for the Pulmonary Patient:  -Group instruction that is supported by a PowerPoint presentation. Instructor discusses benefits of exercise, core components of exercise, frequency, duration, and intensity of an exercise routine, importance of utilizing pulse oximetry during exercise, safety while exercising, and options of places to exercise outside of rehab.     Pulmonary Medications:  -Verbally interactive group education provided by instructor with  focus on inhaled medications and proper administration.   Anatomy and Physiology of the Respiratory System and Intimacy:  -Group instruction provided by PowerPoint, verbal discussion, and written material to support subject matter. Instructor reviews respiratory cycle and anatomical components of the respiratory system and their functions. Instructor also reviews differences in obstructive and restrictive respiratory diseases with examples of each. Intimacy, Sex, and Sexuality differences are reviewed with a discussion on how relationships can change when diagnosed with pulmonary disease. Common sexual concerns are reviewed.   MD DAY -A group question and answer session with a medical doctor that allows participants to ask questions that relate to their pulmonary disease state.   OTHER EDUCATION -Group or individual verbal, written, or video instructions that support the educational goals of the pulmonary rehab program.   Holiday Eating Survival Tips:  -Group instruction provided by PowerPoint slides, verbal discussion, and written materials to support subject matter. The instructor gives patients tips, tricks, and techniques to help them not only survive but enjoy the holidays despite the onslaught of food that accompanies the holidays.   Knowledge Questionnaire Score:  Knowledge Questionnaire Score - 04/24/20 1227       Knowledge Questionnaire Score   Pre Score 14/18           Core Components/Risk Factors/Patient Goals at Admission:  Personal Goals and Risk Factors at Admission - 04/24/20 1015      Core Components/Risk Factors/Patient Goals on Admission   Improve shortness of breath with ADL's Yes    Intervention Provide education, individualized exercise plan and daily activity instruction to help decrease symptoms of SOB with activities of daily living.    Expected Outcomes Short Term: Improve cardiorespiratory fitness to achieve a reduction of symptoms when performing ADLs;Long Term: Be able to perform more ADLs without symptoms or delay the onset of symptoms           Core Components/Risk Factors/Patient Goals Review:   Goals and Risk Factor Review    Row Name 04/24/20 1016             Core Components/Risk Factors/Patient Goals Review   Personal Goals Review Develop more efficient breathing techniques such as purse lipped breathing and diaphragmatic breathing and practicing self-pacing with activity.;Increase knowledge of respiratory medications and ability to use respiratory devices properly.;Improve shortness of breath with ADL's              Core Components/Risk Factors/Patient Goals at Discharge (Final Review):   Goals and Risk Factor Review - 04/24/20 1016      Core Components/Risk Factors/Patient Goals Review   Personal Goals Review Develop more efficient breathing techniques such as purse lipped breathing and diaphragmatic breathing and practicing self-pacing with activity.;Increase knowledge of respiratory medications and ability to use respiratory devices properly.;Improve shortness of breath with ADL's           ITP Comments:   Comments: ITP REVIEW Pt is making expected progress toward pulmonary rehab goals after completing 0 sessions. Recommend continued exercise, life style modification, education, and utilization of breathing techniques to increase stamina and  strength and decrease shortness of breath with exertion.

## 2020-05-04 ENCOUNTER — Encounter (HOSPITAL_COMMUNITY)
Admission: RE | Admit: 2020-05-04 | Discharge: 2020-05-04 | Disposition: A | Discharge status: Home / Self Care | Payer: No Typology Code available for payment source | Source: Ambulatory Visit | Attending: Cardiology | Admitting: Cardiology

## 2020-05-04 ENCOUNTER — Other Ambulatory Visit: Payer: Self-pay

## 2020-05-04 VITALS — Wt 293.0 lb

## 2020-05-04 DIAGNOSIS — R0600 Dyspnea, unspecified: Secondary | ICD-10-CM | POA: Diagnosis not present

## 2020-05-04 DIAGNOSIS — R0609 Other forms of dyspnea: Secondary | ICD-10-CM

## 2020-05-04 NOTE — Progress Notes (Signed)
Daily Session Note  Patient Details  Name: Jeremy Landry MRN: [id number] Date of Birth: 06-05-1945 Referring Provider:   [name] Pulmonary Rehab Walk Test from 04/24/2020 in [place]  Referring Provider Dr. Radford Pax ([place])      Encounter Date: 05/04/2020  Check In:  Session Check In - 05/04/20 1151      Check-In   Supervising physician immediately available to respond to emergencies [facility] immediately available    Physician(s) Dr. Kurtis Bushman    [facility]    Staff Present Rosebud Poles, RN, BSN;Lisa Ysidro Evert, RN;David [place], MS, ACSM-CEP, CCRP, Exercise Physiologist    Virtual Visit No    Medication changes reported     No    Fall or balance concerns reported    No    Tobacco Cessation No Change    Warm-up and Cool-down Performed on first and last piece of equipment    Resistance Training Performed Yes    VAD Patient? No    PAD/SET Patient? No      Pain Assessment   Currently in Pain? Yes    Pain Location Shoulder    Pain Orientation Right    Pain Descriptors / Indicators Aching;Nagging    Pain Type Chronic pain    Pain Onset 1 to 4 weeks ago    Pain Frequency Several days a week    Aggravating Factors  movement    Pain Relieving Factors rest    Effect of Pain on Daily Activities limits activity    Multiple Pain Sites No           Capillary Blood Glucose: No results found for this or any previous visit (from the past 24 hour(s)).    Social History   Tobacco Use  Smoking Status Former Smoker  . Packs/day: 1.00  . Years: 40.00  . Pack years: 40.00  . Quit date: 07/23/2014  . Years since quitting: 5.7  Smokeless Tobacco Never Used    Goals Met:  Proper associated with RPD/PD & O2 Sat Exercise tolerated well Strength training completed today  Goals Unmet:  Not Applicable  Comments: Service time is from 0955 to 1120    Dr. Fransico Him is Medical Director for Cardiac Rehab at  [facility].

## 2020-05-04 NOTE — Progress Notes (Signed)
Jeremy Landry 75 y.o. male Nutrition Note  Diagnosis: dyspnea on exertion  Past Medical History:  Diagnosis Date  . Alcoholism ([place])   . Anxiety   . Arthritis   . Chronic headaches   . Congestive heart failure ([place])   . COPD (chronic obstructive pulmonary disease) ([place])   . Depression   . GERD (gastroesophageal reflux disease)   . Glaucoma   . History of kidney stones   . Hypertension   . Kidney stones   . Lung cancer ([place])   . Pneumonia   . Prostate cancer ([place])   . Stroke [facility])    " small stroke one time "  . Wears glasses   . Wears partial dentures      Medications reviewed.   Current Outpatient Medications:  .  albuterol (VENTOLIN HFA) 108 (90 Base) MCG/ACT inhaler, Inhale 2 puffs into the lungs every 6 (six) hours as needed for wheezing or shortness of breath., Disp: , Rfl:  .  atorvastatin (LIPITOR) 40 MG tablet, Take 40 mg by mouth daily., Disp: , Rfl:  .  brimonidine (ALPHAGAN P) 0.1 % SOLN, Place 1 drop into both eyes 2 (two) times daily., Disp: , Rfl:  .  carboxymethylcellulose (REFRESH PLUS) 0.5 % SOLN, Place 1 drop into both eyes 3 (three) times daily as needed (dry eyes)., Disp: , Rfl:  .  Cholecalciferol 50 MCG (2000 UT) TABS, Take 2,000 Units by mouth daily., Disp: , Rfl:  .  clopidogrel (PLAVIX) 75 MG tablet, Take 75 mg by mouth daily., Disp: , Rfl:  .  escitalopram (LEXAPRO) 10 MG tablet, Take 10 mg by mouth daily., Disp: , Rfl:  .  finasteride (PROSCAR) 5 MG tablet, Take 5 mg by mouth daily., Disp: , Rfl:  .  HYDROcodone-acetaminophen (NORCO) 5-325 MG tablet, Take 1 tablet by mouth every 6 (six) hours as needed for moderate pain., Disp: 5 tablet, Rfl: 0 .  nitroGLYCERIN (NITROSTAT) 0.4 MG SL tablet, Place 0.4 mg under the tongue every 5 (five) minutes as needed for chest pain., Disp: , Rfl:  .  omeprazole (PRILOSEC) 20 MG capsule, Take 20 mg by mouth daily., Disp: , Rfl:  .  oxyCODONE (OXY IR/ROXICODONE) 5 MG immediate release tablet, Take 1 tablet (5 mg  total) by mouth every 6 (six) hours as needed for severe pain., Disp: 15 tablet, Rfl: 0 .  OXYGEN, Inhale 3-4 L/min into the lungs as needed (short of breath). , Disp: , Rfl:  .  prazosin (MINIPRESS) 1 MG capsule, Take 1 mg by mouth at bedtime., Disp: , Rfl:  .  terazosin (HYTRIN) 5 MG capsule, Take 5 mg by mouth at bedtime., Disp: , Rfl:  .  traZODone (DESYREL) 50 MG tablet, Take 50 mg by mouth at bedtime as needed for sleep., Disp: , Rfl:    Ht Readings from Last 1 Encounters:  04/24/20 6\' 2"  (1.88 m)     Wt Readings from Last 3 Encounters:  04/24/20 293 lb 10.4 oz (133.2 kg)  01/19/20 280 lb (127 kg)  12/23/19 287 lb 9.6 oz (130.5 kg)     There is no height or weight on file to calculate BMI.   Social History   Tobacco Use  Smoking Status Former Smoker  . Packs/day: 1.00  . Years: 40.00  . Pack years: 40.00  . Quit date: 07/23/2014  . Years since quitting: 5.7  Smokeless Tobacco Never Used      Nutrition Note  Spoke with pt. Nutrition Plan  and Nutrition Survey goals reviewed with pt.   Pt states primary goal of weight loss. Reviewed diet and weight history. Never gone on a "diet". He has tried to reduce portions and added fruits/vegetables in the past to lose weight. No significant weight cycling reported.  He lives with brother and they both cook. Pt grocery shops. He leans on cart when tired. He does not use the motorized cart. He used to be very active. He has difficult time exercising d/t SOB. He typically watches a lot of tv and often snacks while watching. He reports skipping meals to lose weight.  Diet recall: Breakfast: 8 oz orange juice Lunch: 2 oatmeal cakes  Dinner: center cut pork chops (pan fried), broccoli with butter, wheat bread x2 slices, orange soda  Snacks: trying to choose pears, celery  Drinks ~2 sodas per day, some sweet tea, and 1 cup juice  He eats out occasionally. He drinks a glass of water before meals. Interested in label reading.  We  reviewed reducing sugary beverages. Pt expressed understanding of the information reviewed.   Nutrition Diagnosis  ? Obese  Landry = 35-39.9 related to excessive energy intake and increased sedentary behaviors as evidenced by a  BMI 37.62 kg/m2, pt reported weight gain, poor food choices   Nutrition Intervention ? Pt's individual nutrition plan reviewed with pt. ? Label reading, healthy beverages, plate method    ? Continue client-centered nutrition education by RD, as part of interdisciplinary care.  Goal(s) ? Pt to identify food quantities necessary to achieve weight loss of 6-24 lb at graduation from cardiac rehab.  ? Pt to build a healthy plate including vegetables, fruits, whole grains, and low-fat dairy products in a heart healthy meal plan. ? Pt to reduce sugary beverages to <1 per day Plan:   Will provide client-centered nutrition education as part of interdisciplinary care  Monitor and evaluate progress toward nutrition goal with team.   Michaele Offer, MS, RDN, LDN

## 2020-05-09 ENCOUNTER — Other Ambulatory Visit: Payer: Self-pay

## 2020-05-09 ENCOUNTER — Encounter (HOSPITAL_COMMUNITY)
Admission: RE | Admit: 2020-05-09 | Discharge: 2020-05-09 | Disposition: A | Discharge status: Home / Self Care | Payer: No Typology Code available for payment source | Source: Ambulatory Visit | Attending: Cardiology | Admitting: Cardiology

## 2020-05-09 VITALS — Wt 290.1 lb

## 2020-05-09 DIAGNOSIS — R0609 Other forms of dyspnea: Secondary | ICD-10-CM

## 2020-05-09 DIAGNOSIS — R0600 Dyspnea, unspecified: Secondary | ICD-10-CM

## 2020-05-09 NOTE — Progress Notes (Signed)
Daily Session Note  Patient Details  Name: Jeremy Landry MRN: [id number] Date of Birth: 04/21/1945 Referring Provider:   Flowsheet Row Pulmonary Rehab Walk Test from 04/24/2020 in [facility]  [place]. Turner ([place])      Encounter Date: 05/09/2020  Check In:  Session Check In - 05/09/20 1216      Check-In   Supervising physician immediately available to respond to emergencies [facility] immediately available    Physician(s) Dr. Amery    [facility]    Staff Present Joan Behrens, RN, BSN;Jessica Martin, MS, ACSM-CEP, Exercise Physiologist;Annedrea Stackhouse, RN, MHA;Carlette Carlton, RN, BSN;Lisa Hughes, RN;Meredith Davis RD, LDN;David Makemson, MS, ACSM-CEP, CCRP, Exercise Physiologist    Virtual Visit No    Medication changes reported     No    Fall or balance concerns reported    No    Tobacco Cessation No Change    Warm-up and Cool-down Performed on first and last piece of equipment    Resistance Training Performed No    VAD Patient? No    PAD/SET Patient? No      Pain Assessment   Currently in Pain? No/denies    Multiple Pain Sites No           Capillary Blood Glucose: No results found for this or any previous visit (from the past 24 hour(s)).   Exercise Prescription Changes - 05/09/20 1300      Response to Exercise   Blood Pressure (Admit) 148/84    Blood Pressure (Exercise) 140/84    Blood Pressure (Exit) 140/88    Heart Rate (Admit) 83 bpm    Heart Rate (Exercise) 94 bpm    Heart Rate (Exit) 97 bpm    Oxygen Saturation (Admit) 93 %    Oxygen Saturation (Exercise) 94 %    Oxygen Saturation (Exit) 97 %    Rating of Perceived Exertion (Exercise) 13    Perceived Dyspnea (Exercise) 2    Duration Progress to 30 minutes of  aerobic without signs/symptoms of physical distress    Intensity --   40-80% HRR     Resistance Training   Training Prescription Yes    Weight Red bands    Reps  10-15    Time 10 Minutes      Oxygen   Oxygen Continuous    Liters 2      NuStep   Level 2    SPM 80    Minutes 30    METs 1.9           Social History   Tobacco Use  Smoking Status Former Smoker  . Packs/day: 1.00  . Years: 40.00  . Pack years: 40.00  . Quit date: 07/23/2014  . Years since quitting: 5.8  Smokeless Tobacco Never Used    Goals Met:  Proper associated with RPD/PD & O2 Sat Exercise tolerated well Strength training completed today  Goals Unmet:  Not Applicable  Comments: Service time is from 1015 to 1130    Dr. Traci Turner is Medical Director for Cardiac Rehab at [facility]. 

## 2020-05-11 ENCOUNTER — Other Ambulatory Visit: Payer: Self-pay

## 2020-05-11 ENCOUNTER — Encounter (HOSPITAL_COMMUNITY)
Admission: RE | Admit: 2020-05-11 | Discharge: 2020-05-11 | Disposition: A | Discharge status: Home / Self Care | Payer: No Typology Code available for payment source | Source: Ambulatory Visit | Attending: Cardiology | Admitting: Cardiology

## 2020-05-11 DIAGNOSIS — R0600 Dyspnea, unspecified: Secondary | ICD-10-CM | POA: Diagnosis not present

## 2020-05-11 DIAGNOSIS — R0609 Other forms of dyspnea: Secondary | ICD-10-CM

## 2020-05-11 NOTE — Progress Notes (Signed)
Daily Session Note  Patient Details  Name: Jeremy Landry MRN: [id number] Date of Birth: 01/10/1945 Referring Provider:   Flowsheet Row Pulmonary Rehab Walk Test from 04/24/2020 in [facility]  Ref[place] Provider Dr. [place] ([place]      Encounter Date: 05/11/2020  Check In:  Session Check In - 05/11/20 1146      Check-In   Supervising physician immediately available to respond to emergencies [facility] immediately available    Physician(s) Dr. Griffith    [facility]    Staff Present Joan Behrens, RN, BSN;Jessica Martin, MS, ACSM-CEP, Exercise Physiologist;Lisa Hughes, RN;David Makemson, MS, ACSM-CEP, CCRP, Exercise Physiologist    Virtual Visit No    Medication changes reported     No    Fall or balance concerns reported    No    Tobacco Cessation No Change    Warm-up and Cool-down Performed as group-led instruction    Resistance Training Performed Yes    VAD Patient? No    PAD/SET Patient? No      Pain Assessment   Currently in Pain? Yes    Pain Score 9     Pain Location Shoulder    Pain Orientation Right    Pain Descriptors / Indicators Aching    Pain Type Chronic pain    Multiple Pain Sites No           Capillary Blood Glucose: No results found for this or any previous visit (from the past 24 hour(s)).    Social History   Tobacco Use  Smoking Status Former Smoker  . Packs/day: 1.00  . Years: 40.00  . Pack years: 40.00  . Quit date: 07/23/2014  . Years since quitting: 5.8  Smokeless Tobacco Never Used    Goals Met:  Proper associated with RPD/PD & O2 Sat Exercise tolerated well Strength training completed today  Goals Unmet:  Not Applicable  Comments: Service time is from 1015 to 1120    Dr. Traci Turner is Medical Director for Cardiac Rehab at [facility]. 

## 2020-05-16 ENCOUNTER — Encounter (HOSPITAL_COMMUNITY)
Admission: RE | Admit: 2020-05-16 | Discharge: 2020-05-16 | Disposition: A | Discharge status: Home / Self Care | Payer: No Typology Code available for payment source | Source: Ambulatory Visit | Attending: Cardiology | Admitting: Cardiology

## 2020-05-16 ENCOUNTER — Other Ambulatory Visit: Payer: Self-pay

## 2020-05-16 ENCOUNTER — Telehealth (HOSPITAL_COMMUNITY): Payer: Self-pay | Admitting: *Deleted

## 2020-05-16 DIAGNOSIS — R0609 Other forms of dyspnea: Secondary | ICD-10-CM

## 2020-05-16 DIAGNOSIS — R0600 Dyspnea, unspecified: Secondary | ICD-10-CM | POA: Diagnosis not present

## 2020-05-16 NOTE — Progress Notes (Signed)
Daily Session Note  Patient Details  Name: Jeremy Landry MRN: [id number] Date of Birth: 1945/10/27 Referring Provider:   April Landry Pulmonary Rehab Walk Test from 04/24/2020 in [place]  Referring Provider Jeremy Landry ([place])      Encounter Date: 05/16/2020  Check In:  Session Check In - 05/16/20 1151      Check-In   Supervising physician immediately available to respond to emergencies [facility] immediately available    Physician(s) Jeremy Landry    [facility]    Staff Present Jeremy Poles, RN, Jeremy Laud, MS, ACSM-CEP, Exercise Physiologist;Jeremy Rosezella [place], RN, MHA;Jeremy Wilber Oliphant, RN, Jeremy Glazier, MS, ACSM-CEP, CCRP, Exercise Physiologist;Jeremy Celesta [place], MS, ACSM CEP, Exercise Physiologist    Virtual Visit No    Medication changes reported     No    Fall or balance concerns reported    No    Tobacco Cessation No Change    Warm-up and Cool-down Performed as group-led instruction    Resistance Training Performed Yes    VAD Patient? No    PAD/SET Patient? No      Pain Assessment   Currently in Pain? No/denies    Pain Score 0-No pain    Multiple Pain Sites No           Capillary Blood Glucose: No results found for this or any previous visit (from the past 24 hour(s)).    Social History   Tobacco Use  Smoking Status Former Smoker  . Packs/day: 1.00  . Years: 40.00  . Pack years: 40.00  . Quit date: 07/23/2014  . Years since quitting: 5.8  Smokeless Tobacco Never Used    Goals Met:  Proper associated with RPD/PD & O2 Sat Exercise tolerated well No report of cardiac concerns or symptoms Strength training completed today  Goals Unmet:  Not Applicable  Comments: Service time is from 1015 to 1130    Jeremy Landry is Medical Director for Cardiac Rehab at [facility].

## 2020-05-18 ENCOUNTER — Encounter (HOSPITAL_COMMUNITY)
Admission: RE | Admit: 2020-05-18 | Discharge: 2020-05-18 | Disposition: A | Discharge status: Home / Self Care | Payer: No Typology Code available for payment source | Source: Ambulatory Visit | Attending: Cardiology | Admitting: Cardiology

## 2020-05-18 ENCOUNTER — Other Ambulatory Visit: Payer: Self-pay

## 2020-05-18 DIAGNOSIS — R0600 Dyspnea, unspecified: Secondary | ICD-10-CM | POA: Diagnosis not present

## 2020-05-18 DIAGNOSIS — R0609 Other forms of dyspnea: Secondary | ICD-10-CM

## 2020-05-18 NOTE — Progress Notes (Signed)
Daily Session Note  Patient Details  Name: Jeremy Landry MRN: [id number] Date of Birth: November 01, 1945 Referring Provider:   [name] Pulmonary Rehab Walk Test from 04/24/2020 in [place]  Referring Provider Dr. Radford Pax ([place])      Encounter Date: 05/18/2020  Check In:  Session Check In - 05/18/20 1156      Check-In   Supervising physician immediately available to respond to emergencies [facility] immediately available    Physician(s) Dr. Lonny Prude    [facility]    Staff Present Rosebud Poles, RN, Isaac Laud, MS, ACSM-CEP, Exercise Physiologist;Carlette Wilber Oliphant, RN, Milus Glazier, MS, ACSM-CEP, CCRP, Exercise Physiologist;Annedrea Rosezella [place], RN, [place]    Virtual Visit No    Medication changes reported     No    Fall or balance concerns reported    No    Tobacco Cessation No Change    Warm-up and Cool-down Performed as group-led instruction    Resistance Training Performed Yes    VAD Patient? No    PAD/SET Patient? No      Pain Assessment   Currently in Pain? No/denies    Multiple Pain Sites No           Capillary Blood Glucose: No results found for this or any previous visit (from the past 24 hour(s)).    Social History   Tobacco Use  Smoking Status Former Smoker  . Packs/day: 1.00  . Years: 40.00  . Pack years: 40.00  . Quit date: 07/23/2014  . Years since quitting: 5.8  Smokeless Tobacco Never Used    Goals Met:  Proper associated with RPD/PD & O2 Sat Exercise tolerated well No report of cardiac concerns or symptoms Strength training completed today  Goals Unmet:  Not Applicable  Comments: Service time is from 1015 to 1130    Dr. Fransico Him is Medical Director for Cardiac Rehab at [facility].

## 2020-05-23 ENCOUNTER — Encounter (HOSPITAL_COMMUNITY)
Admission: RE | Admit: 2020-05-23 | Discharge: 2020-05-23 | Disposition: A | Discharge status: Home / Self Care | Payer: No Typology Code available for payment source | Source: Ambulatory Visit | Attending: Cardiology | Admitting: Cardiology

## 2020-05-23 ENCOUNTER — Other Ambulatory Visit: Payer: Self-pay

## 2020-05-23 VITALS — Wt 293.9 lb

## 2020-05-23 DIAGNOSIS — R0600 Dyspnea, unspecified: Secondary | ICD-10-CM

## 2020-05-23 DIAGNOSIS — R0609 Other forms of dyspnea: Secondary | ICD-10-CM

## 2020-05-23 NOTE — Progress Notes (Signed)
Daily Session Note  Patient Details  Name: Jeremy Landry MRN: [id number] Date of Birth: 07/19/1945 Referring Provider:   Flowsheet Row Pulmonary Rehab Walk Test from 04/24/2020 in [facility]  [place] Provider Dr. Turner ([place]      Encounter Date: 05/23/2020  Check In:  Session Check In - 05/23/20 1151      Check-In   Supervising physician immediately available to respond to emergencies [facility] immediately available    Physician(s) Dr. Netty    [facility]    Staff Present Joan Behrens, RN, BSN;Jessica Martin, MS, ACSM-CEP, Exercise Physiologist;Annedrea Stackhouse, RN, MHA;Lisa Hughes, RN    Virtual Visit No    Medication changes reported     No    Fall or balance concerns reported    No    Tobacco Cessation No Change    Warm-up and Cool-down Performed as group-led instruction    Resistance Training Performed Yes    VAD Patient? No    PAD/SET Patient? No      Pain Assessment   Currently in Pain? No/denies    Multiple Pain Sites No           Capillary Blood Glucose: No results found for this or any previous visit (from the past 24 hour(s)).   Exercise Prescription Changes - 05/23/20 1200      Response to Exercise   Blood Pressure (Admit) 130/76    Blood Pressure (Exercise) 150/86    Blood Pressure (Exit) 118/64    Heart Rate (Admit) 86 bpm    Heart Rate (Exercise) 84 bpm    Heart Rate (Exit) 77 bpm    Oxygen Saturation (Admit) 91 %    Oxygen Saturation (Exercise) 93 %    Oxygen Saturation (Exit) 92 %    Rating of Perceived Exertion (Exercise) 13    Perceived Dyspnea (Exercise) 3    Duration Continue with 30 min of aerobic exercise without signs/symptoms of physical distress.    Intensity THRR unchanged      Resistance Training   Training Prescription Yes    Weight red bands    Reps 10-15    Time 10 Minutes      Oxygen   Oxygen Continuous    Liters 2      NuStep   SPM 80    Minutes  30    METs 1.4           Social History   Tobacco Use  Smoking Status Former Smoker  . Packs/day: 1.00  . Years: 40.00  . Pack years: 40.00  . Quit date: 07/23/2014  . Years since quitting: 5.8  Smokeless Tobacco Never Used    Goals Met:  No report of cardiac concerns or symptoms Strength training completed today  Goals Unmet:  Not Applicable  Comments: Service time is from 1015 to 1125    Dr. Traci Turner is Medical Director for Cardiac Rehab at [facility]. 

## 2020-05-25 ENCOUNTER — Other Ambulatory Visit: Payer: Self-pay

## 2020-05-25 ENCOUNTER — Telehealth (HOSPITAL_COMMUNITY): Payer: Self-pay | Admitting: *Deleted

## 2020-05-25 ENCOUNTER — Encounter (HOSPITAL_COMMUNITY)
Admission: RE | Admit: 2020-05-25 | Discharge: 2020-05-25 | Disposition: A | Discharge status: Home / Self Care | Payer: No Typology Code available for payment source | Source: Ambulatory Visit | Attending: Cardiology | Admitting: Cardiology

## 2020-05-25 DIAGNOSIS — R0600 Dyspnea, unspecified: Secondary | ICD-10-CM

## 2020-05-25 DIAGNOSIS — R0609 Other forms of dyspnea: Secondary | ICD-10-CM

## 2020-05-25 NOTE — Progress Notes (Signed)
Daily Session Note  Patient Details  Name: Jeremy Landry MRN: [id number] Date of Birth: 03/03/1945 Referring Provider:   [name] Pulmonary Rehab Walk Test from 04/24/2020 in [place]  Referring Provider Dr. Radford Pax ([place])      Encounter Date: 05/25/2020  Check In:  Session Check In - 05/25/20 1136      Check-In   Supervising physician immediately available to respond to emergencies [facility] immediately available    Physician(s) Dr. Maren [place]    [facility]    Staff Present Rosebud Poles, RN, Isaac Laud, MS, ACSM-CEP, Exercise Physiologist;Lisa Ysidro Evert, RN    Virtual Visit No    Medication changes reported     No    Fall or balance concerns reported    No    Tobacco Cessation No Change    Warm-up and Cool-down Performed as group-led instruction    Resistance Training Performed Yes    VAD Patient? No    PAD/SET Patient? No      Pain Assessment   Currently in Pain? No/denies    Multiple Pain Sites No           Capillary Blood Glucose: No results found for this or any previous visit (from the past 24 hour(s)).    Social History   Tobacco Use  Smoking Status Former Smoker  . Packs/day: 1.00  . Years: 40.00  . Pack years: 40.00  . Quit date: 07/23/2014  . Years since quitting: 5.8  Smokeless Tobacco Never Used    Goals Met:  Proper associated with RPD/PD & O2 Sat Exercise tolerated well Strength training completed today  Goals Unmet:  Not Applicable  Comments: Service time is from 1015 to 1120    Dr. Fransico Him is Medical Director for Cardiac Rehab at [facility]

## 2020-05-30 ENCOUNTER — Other Ambulatory Visit: Payer: Self-pay

## 2020-05-30 ENCOUNTER — Encounter (HOSPITAL_COMMUNITY)
Admission: RE | Admit: 2020-05-30 | Discharge: 2020-05-30 | Disposition: A | Discharge status: Home / Self Care | Payer: No Typology Code available for payment source | Source: Ambulatory Visit | Attending: Cardiology | Admitting: Cardiology

## 2020-05-30 DIAGNOSIS — R0600 Dyspnea, unspecified: Secondary | ICD-10-CM | POA: Diagnosis not present

## 2020-05-30 DIAGNOSIS — R0609 Other forms of dyspnea: Secondary | ICD-10-CM

## 2020-05-30 NOTE — Progress Notes (Signed)
Daily Session Note  Patient Details  Name: Jeremy Landry MRN: [id number] Date of Birth: 1945-06-06 Referring Provider:   [name] Pulmonary Rehab Walk Test from 04/24/2020 in [place]  Referring Provider Dr. Radford Pax ([facility])      Encounter Date: 05/30/2020  Check In:  Session Check In - 05/30/20 1159      Check-In   Supervising physician immediately available to respond to emergencies [facility] immediately available    Physician(s) Dr. Maren Beach    Location [facility]    Staff Present Leda Roys, MS, ACSM-CEP, Exercise Physiologist;Annedrea Rosezella [place], RN, MHA;Lisa Ysidro Evert, RN;Olinty Celesta Aver, MS, ACSM CEP, Exercise Physiologist    Virtual Visit No    Medication changes reported     No    Fall or balance concerns reported    No    Tobacco Cessation No Change    Warm-up and Cool-down Performed as group-led instruction    Resistance Training Performed Yes    VAD Patient? No    PAD/SET Patient? No      Pain Assessment   Currently in Pain? No/denies    Multiple Pain Sites No           Capillary Blood Glucose: No results found for this or any previous visit (from the past 24 hour(s)).    Social History   Tobacco Use  Smoking Status Former Smoker  . Packs/day: 1.00  . Years: 40.00  . Pack years: 40.00  . Quit date: 07/23/2014  . Years since quitting: 5.8  Smokeless Tobacco Never Used    Goals Met:  Proper associated with RPD/PD & O2 Sat Exercise tolerated well No report of cardiac concerns or symptoms Strength training completed today  Goals Unmet:  Not Applicable  Comments: Service time is from 1015 to 1130    Dr. Fransico Him is Medical Director for Cardiac Rehab at [facility].

## 2020-05-30 NOTE — Progress Notes (Signed)
Pulmonary Individual Treatment Plan  Patient Details  Name: LINKOLN ALKIRE MRN: [id number] Date of Birth: 05-04-1945 Referring Provider:   Doristine Devoid Pulmonary Rehab Walk Test from 04/24/2020 in [facility]  Referring Provider [facility]. Mayford Knife ([facility])      Initial Encounter Date:  Flowsheet Row Pulmonary Rehab Walk Test from 04/24/2020 in [facility]  Date 04/24/20      Visit Diagnosis: Dyspnea on exertion  Patient's Home Medications on Admission:   Current Outpatient Medications:  .  albuterol (VENTOLIN HFA) 108 (90 Base) MCG/ACT inhaler, Inhale 2 puffs into the lungs every 6 (six) hours as needed for wheezing or shortness of breath., Disp: , Rfl:  .  atorvastatin (LIPITOR) 40 MG tablet, Take 40 mg by mouth daily., Disp: , Rfl:  .  brimonidine (ALPHAGAN P) 0.1 % SOLN, Place 1 drop into both eyes 2 (two) times daily., Disp: , Rfl:  .  carboxymethylcellulose (REFRESH PLUS) 0.5 % SOLN, Place 1 drop into both eyes 3 (three) times daily as needed (dry eyes)., Disp: , Rfl:  .  Cholecalciferol 50 MCG (2000 UT) TABS, Take 2,000 Units by mouth daily., Disp: , Rfl:  .  clopidogrel (PLAVIX) 75 MG tablet, Take 75 mg by mouth daily., Disp: , Rfl:  .  escitalopram (LEXAPRO) 10 MG tablet, Take 10 mg by mouth daily., Disp: , Rfl:  .  finasteride (PROSCAR) 5 MG tablet, Take 5 mg by mouth daily., Disp: , Rfl:  .  HYDROcodone-acetaminophen (NORCO) 5-325 MG tablet, Take 1 tablet by mouth every 6 (six) hours as needed for moderate pain., Disp: 5 tablet, Rfl: 0 .  nitroGLYCERIN (NITROSTAT) 0.4 MG SL tablet, Place 0.4 mg under the tongue every 5 (five) minutes as needed for chest pain., Disp: , Rfl:  .  omeprazole (PRILOSEC) 20 MG capsule, Take 20 mg by mouth daily., Disp: , Rfl:  .  oxyCODONE (OXY IR/ROXICODONE) 5 MG immediate release tablet, Take 1 tablet (5 mg total) by mouth every 6 (six) hours as needed for severe pain., Disp: 15 tablet, Rfl: 0 .   OXYGEN, Inhale 3-4 L/min into the lungs as needed (short of breath). , Disp: , Rfl:  .  prazosin (MINIPRESS) 1 MG capsule, Take 1 mg by mouth at bedtime., Disp: , Rfl:  .  terazosin (HYTRIN) 5 MG capsule, Take 5 mg by mouth at bedtime., Disp: , Rfl:  .  traZODone (DESYREL) 50 MG tablet, Take 50 mg by mouth at bedtime as needed for sleep., Disp: , Rfl:   Past Medical History: Past Medical History:  Diagnosis Date  . Alcoholism (HCC)   . Anxiety   . Arthritis   . Chronic headaches   . Congestive heart failure (HCC)   . COPD (chronic obstructive pulmonary disease) (HCC)   . Depression   . GERD (gastroesophageal reflux disease)   . Glaucoma   . History of kidney stones   . Hypertension   . Kidney stones   . Lung cancer (HCC)   . Pneumonia   . Prostate cancer (HCC)   . Stroke [facility])    " small stroke one time "  . Wears glasses   . Wears partial dentures     Tobacco Use: Social History   Tobacco Use  Smoking Status Former Smoker  . Packs/day: 1.00  . Years: 40.00  . Pack years: 40.00  . Quit date: 07/23/2014  . Years since quitting: 5.8  Smokeless Tobacco Never Used  Labs: Recent Review Flowsheet Data    Labs for ITP Cardiac and Pulmonary Rehab Latest Ref Rng & Units 05/24/2010 08/12/2014 08/14/2014 10/21/2014   Cholestrol 0 - 200 mg/dL 121 156 165 -   LDLCALC 0 - 99 mg/dL 53 ... 92 109(H) -   HDL >40 mg/dL 38(L) 31(L) 41 -   Trlycerides <150 mg/dL 152(H) 167(H) 75 -   Hemoglobin A1c 4.8 - 5.6 % 5.1 ... 5.7(H) 5.5 -   PHART 7.350 - 7.450 - - - 7.410   PCO2ART 35.0 - 45.0 mmHg - - - 39.9   HCO3 20.0 - 24.0 mEq/L - - - 25.4(H)   TCO2 0 - 100 mmol/L - - - 27   O2SAT % - - - 93.0      Capillary Blood Glucose: Lab Results  Component Value Date   GLUCAP 110 (H) 01/30/2017   GLUCAP 151 (H) 08/16/2014   GLUCAP 119 (H) 08/15/2014   GLUCAP 148 (H) 08/14/2014   GLUCAP 144 (H) 08/13/2014     Pulmonary Assessment Scores:  Pulmonary Assessment Scores    Row Name  04/24/20 1215 04/24/20 1226       ADL UCSD   ADL Phase Entry Entry    SOB Score total -- 84         CAT Score   CAT Score -- 32         mMRC Score   mMRC Score 4 --          UCSD: Self-administered rating of dyspnea associated with activities of daily living (ADLs) 6-point scale (0 = "not at all" to 5 = "maximal or unable to do because of breathlessness")  Scoring Scores range from 0 to 120.  Minimally important difference is 5 units  CAT: CAT can identify the health impairment of COPD patients and is better correlated with disease progression.  CAT has a scoring range of zero to 40. The CAT score is classified into four groups of low (less than 10), medium (10 - 20), high (21-30) and very high (31-40) based on the impact level of disease on health status. A CAT score over 10 suggests significant symptoms.  A worsening CAT score could be explained by an exacerbation, poor medication adherence, poor inhaler technique, or progression of COPD or comorbid conditions.  CAT MCID is 2 points  mMRC: mMRC (Modified Medical Research Council) Dyspnea Scale is used to assess the degree of baseline functional disability in patients of respiratory disease due to dyspnea. No minimal important difference is established. A decrease in score of 1 point or greater is considered a positive change.   Pulmonary Function Assessment:  Pulmonary Function Assessment - 04/24/20 0959      Breath   Bilateral Breath Sounds Clear;Decreased    Shortness of Breath Yes;Limiting activity           Exercise Target Goals: Exercise Program Goal: Individual exercise prescription set using results from initial 6 min walk test and THRR while considering  patient's activity barriers and safety.   Exercise Prescription Goal: Initial exercise prescription builds to 30-45 minutes a day of aerobic activity, 2-3 days per week.  Home exercise guidelines will be given to patient during program as part of exercise  prescription that the participant will acknowledge.  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 Pt used wheelchair for assistance and was still very unsteady     Resting HR 73 bpm     Resting BP 144/90     Resting Oxygen Saturation  92 %     Exercise Oxygen Saturation  during 6 min walk 86 %     Max Ex. HR 97 bpm     Max Ex. BP 170/100     2 Minute Post BP 175/106           Interval HR   1 Minute HR 97     2 Minute HR 94     3 Minute HR 89     4 Minute HR 85     5 Minute HR 76     6 Minute HR 93     2 Minute Post HR 79     Interval Heart Rate? Yes           Interval Oxygen   Interval Oxygen? Yes     Baseline Oxygen Saturation % 92 %     1 Minute Oxygen Saturation % 86 %     1 Minute Liters of Oxygen 1 L  Increased to 2L     2 Minute Oxygen Saturation % 88 %     2 Minute Liters of Oxygen 2 L     3 Minute Oxygen Saturation % 92 %     3 Minute Liters of Oxygen 2 L     4 Minute Oxygen Saturation % 94 %     4 Minute Liters of Oxygen 2 L     5 Minute Oxygen Saturation % 96 %     5 Minute Liters of Oxygen 2 L     6 Minute Oxygen Saturation % 91 %     6 Minute Liters of Oxygen 2 L     2 Minute Post Oxygen Saturation % 93 %     2 Minute Post Liters of Oxygen 2 L            Oxygen Initial Assessment:  Oxygen Initial Assessment - 04/24/20 0959      Home Oxygen   Home Oxygen Device None    Sleep Oxygen Prescription  None    Home Exercise Oxygen Prescription None    Home Resting Oxygen Prescription None    Compliance with Home Oxygen Use --   Pt's oxygen got taken from him 2 weeks ago due to the [place] stating he does not need it anymore     Initial 6 min Walk   Oxygen Used Continuous    Liters per minute 2      Program Oxygen Prescription   Program Oxygen Prescription Continuous    Liters per minute 2    Comments Pt sats dropped to 88% on RA  just from standing up from chair. Pt needs to be on supplemental oxygen with exertion and maybe even at rest.      Intervention   Short Term Goals To learn and exhibit compliance with exercise, home and travel O2 prescription;To learn and understand importance of monitoring SPO2 with pulse oximeter and demonstrate accurate use of the pulse oximeter.;To learn and understand importance of maintaining oxygen saturations>88%;To learn and demonstrate proper pursed lip breathing techniques or other breathing techniques.;To learn and demonstrate proper use of respiratory medications    Long  Term Goals Exhibits compliance with exercise, home and travel O2 prescription;Verbalizes importance of monitoring SPO2 with pulse oximeter and return demonstration;Maintenance of O2 saturations>88%;Exhibits proper breathing techniques, such as pursed lip breathing or other method taught during program session;Compliance with respiratory medication;Demonstrates proper use of MDI's           Oxygen Re-Evaluation:  Oxygen Re-Evaluation    Row Name 05/01/20 0901 05/25/20 0744           Program Oxygen Prescription   Program Oxygen Prescription Continuous Continuous      Liters per minute 2 2      Comments Pt sats dropped to 88% on RA just from standing up from chair. Pt needs to be on supplemental oxygen with exertion and maybe even at rest. --             Home Oxygen   Home Oxygen Device None None      Sleep Oxygen Prescription None None      Home Exercise Oxygen Prescription  None None      Home Resting Oxygen Prescription None None             Goals/Expected Outcomes   Short Term Goals To learn and exhibit compliance with exercise, home and travel O2 prescription;To learn and understand importance of monitoring SPO2 with pulse oximeter and demonstrate accurate use of the pulse oximeter.;To learn and understand importance of maintaining oxygen saturations>88%;To learn and demonstrate proper pursed lip breathing techniques or other breathing techniques.;To learn and demonstrate proper use of respiratory medications To learn and exhibit compliance with exercise, home and travel O2 prescription;To learn and understand importance of monitoring SPO2 with pulse oximeter and demonstrate accurate use of the pulse oximeter.;To learn and understand importance of maintaining oxygen saturations>88%;To learn and demonstrate proper pursed lip breathing techniques or other breathing techniques.;To learn and demonstrate proper use of respiratory medications      Long  Term Goals Exhibits compliance with exercise, home and travel O2 prescription;Verbalizes importance of monitoring SPO2 with pulse oximeter and return demonstration;Maintenance of O2 saturations>88%;Exhibits proper breathing techniques, such as pursed lip breathing or other method taught during program session;Compliance with respiratory medication;Demonstrates proper use of MDI's Exhibits compliance with exercise, home and travel O2 prescription;Verbalizes importance of monitoring SPO2 with pulse oximeter and return demonstration;Maintenance of O2 saturations>88%;Exhibits proper breathing techniques, such as pursed lip breathing or other method taught during program session;Compliance with respiratory medication;Demonstrates proper use of MDI's      Comments We are working on getting pt access to supplemental oxygen for home needs and will continue to monitor his oxygen needs while at rehab. Still trying to get pt set up with  home oxygen. [facility]. According to our data he needs to be on supplemental oxygen with exertion and maybe even at rest.      Goals/Expected Outcomes Compliance and understanding of oxygen saturation and pursed lip  breathing. Compliance and understanding of oxygen saturation and pursed lip breathing.             Oxygen Discharge (Final Oxygen Re-Evaluation):  Oxygen Re-Evaluation - 05/25/20 0744      Program Oxygen Prescription   Program Oxygen Prescription Continuous    Liters per minute 2      Home Oxygen   Home Oxygen Device None    Sleep Oxygen Prescription None    Home Exercise Oxygen Prescription None    Home Resting Oxygen Prescription None      Goals/Expected Outcomes   Short Term Goals To learn and exhibit compliance with exercise, home and travel O2 prescription;To learn and understand importance of monitoring SPO2 with pulse oximeter and demonstrate accurate use of the pulse oximeter.;To learn and understand importance of maintaining oxygen saturations>88%;To learn and demonstrate proper pursed lip breathing techniques or other breathing techniques.;To learn and demonstrate proper use of respiratory medications    Long  Term Goals Exhibits compliance with exercise, home and travel O2 prescription;Verbalizes importance of monitoring SPO2 with pulse oximeter and return demonstration;Maintenance of O2 saturations>88%;Exhibits proper breathing techniques, such as pursed lip breathing or other method taught during program session;Compliance with respiratory medication;Demonstrates proper use of MDI's    Comments Still trying to get pt set up with home oxygen. [facility]. According to our data he needs to be on supplemental oxygen with exertion and maybe even at rest.    Goals/Expected Outcomes Compliance and understanding of oxygen saturation and pursed lip breathing.           Initial  Exercise Prescription:  Initial Exercise Prescription - 04/24/20 1200      Date of Initial Exercise RX and Referring Provider   Date 04/24/20    Referring Provider Dr. Radford Pax ([place])    Expected Discharge Date 06/29/20      Oxygen   Oxygen Continuous    Liters 2      NuStep   Level 1    SPM 75    Minutes 30      Prescription Details   Frequency (times per week) 2    Duration Progress to 30 minutes of continuous aerobic without signs/symptoms of physical distress      Intensity   THRR 40-80% of Max Heartrate 58-117    Ratings of Perceived Exertion 11-13    Perceived Dyspnea 0-4      Progression   Progression Continue to progress workloads to maintain intensity without signs/symptoms of physical distress.      Resistance Training   Training Prescription Yes    Weight Red bands    Reps 10-15           Perform Capillary Blood Glucose checks as needed.  Exercise Prescription Changes:  Exercise Prescription Changes    Row Name 05/09/20 1300 05/23/20 1200           Response to Exercise   Blood Pressure (Admit) 148/84 130/76      Blood Pressure (Exercise) 140/84 150/86      Blood Pressure (Exit) 140/88 118/64      Heart Rate (Admit) 83 bpm 86 bpm      Heart Rate (Exercise) 94 bpm 84 bpm      Heart Rate (Exit) 97 bpm 77 bpm      Oxygen Saturation (Admit) 93 % 91 %      Oxygen Saturation (Exercise) 94 % 93 %      Oxygen  Saturation (Exit) 97 % 92 %      Rating of Perceived Exertion (Exercise) 13 13      Perceived Dyspnea (Exercise) 2 3      Duration Progress to 30 minutes of  aerobic without signs/symptoms of physical distress Continue with 30 min of aerobic exercise without signs/symptoms of physical distress.      Intensity --  40-80% HRR THRR unchanged             Resistance Training   Training Prescription Yes Yes      Weight Red bands red bands      Reps 10-15 10-15      Time 10 Minutes 10 Minutes             Oxygen   Oxygen Continuous Continuous       Liters 2 2             NuStep   Level 2 --      SPM 80 80      Minutes 30 30      METs 1.9 1.4             Exercise Comments:  Exercise Comments    Row Name 05/02/20 1207           Exercise Comments Pt completed first day of exercise and tolerated well for the most part. He was able to do the 30 minutes on the Nustep with no concerns. He was a little unsteady while doing the resistance bands standing and had to sit down several times to rest. He remained on 2L of continuous oxygen throughout the whole exercise session. 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 Will continue to monitor.              Exercise Goals and Review:  Exercise Goals    Row Name 04/24/20 1212             Exercise Goals   Increase Physical Activity Yes       Intervention Provide advice, education, support and counseling about physical activity/exercise needs.;Develop an individualized exercise prescription for aerobic and resistive training based on initial evaluation findings, risk stratification, comorbidities and participant's personal goals.       Expected Outcomes Short Term: Attend rehab on a regular basis to increase amount of physical activity.;Long Term: Add in home exercise to make exercise part of routine and to increase amount of physical activity.;Long Term: Exercising regularly at least 3-5 days a week.       Increase Strength and Stamina Yes       Intervention Provide advice, education, support and counseling about physical activity/exercise needs.;Develop an individualized exercise prescription for aerobic and resistive training based on initial evaluation findings, risk stratification, comorbidities and participant's personal goals.       Expected Outcomes Short Term: Increase workloads from initial exercise prescription for resistance, speed, and METs.;Short Term: Perform resistance training  exercises routinely during rehab and add in resistance training at home;Long Term: Improve cardiorespiratory fitness, muscular endurance and strength as measured by increased METs and functional capacity (6MWT)       Able to understand and use rate of perceived exertion (RPE) scale Yes       Intervention Provide education and explanation on how to use RPE scale       Expected Outcomes Short Term: Able to use RPE daily in rehab  to express subjective intensity level;Long Term:  Able to use RPE to guide intensity level when exercising independently       Able to understand and use Dyspnea scale Yes       Intervention Provide education and explanation on how to use Dyspnea scale       Expected Outcomes Short Term: Able to use Dyspnea scale daily in rehab to express subjective sense of shortness of breath during exertion;Long Term: Able to use Dyspnea scale to guide intensity level when exercising independently       Knowledge and understanding of Target Heart Rate Range (THRR) Yes       Intervention Provide education and explanation of THRR including how the numbers were predicted and where they are located for reference       Expected Outcomes Short Term: Able to state/look up THRR;Long Term: Able to use THRR to govern intensity when exercising independently;Short Term: Able to use daily as guideline for intensity in rehab       Understanding of Exercise Prescription Yes       Intervention Provide education, explanation, and written materials on patient's individual exercise prescription       Expected Outcomes Short Term: Able to explain program exercise prescription;Long Term: Able to explain home exercise prescription to exercise independently              Exercise Goals Re-Evaluation :  Exercise Goals Re-Evaluation    Row Name 05/01/20 0900 05/25/20 0742           Exercise Goal Re-Evaluation   Exercise Goals Review Increase Physical Activity;Increase Strength and Stamina;Able to understand  and use rate of perceived exertion (RPE) scale;Able to understand and use Dyspnea scale;Knowledge and understanding of Target Heart Rate Range (THRR);Understanding of Exercise Prescription Increase Physical Activity;Increase Strength and Stamina;Able to understand and use rate of perceived exertion (RPE) scale;Able to understand and use Dyspnea scale;Knowledge and understanding of Target Heart Rate Range (THRR);Understanding of Exercise Prescription      Comments Pt is scheduled to start exercise this week. We will monitor and make changes as needed. Shawon has completed 7 exercise sessions and has tolerated well so far. He is very deconditioned but is able to do 30 minutes on the Nustep with resting. He is also able to do the warm up and resistance exercises sitting in a chair. He is exercising at 1.4 METS on the Nustep. Will continue to monitor and progress as he is able.      Expected Outcomes Through exercise at rehab and home, the patient will decrease shortness of breath with daily activities and feel confident in carrying out an exercise regimn at home. Through exercise at rehab and home, the patient will decrease shortness of breath with daily activities and feel confident in carrying out an exercise regimn at home.             Discharge Exercise Prescription (Final Exercise Prescription Changes):  Exercise Prescription Changes - 05/23/20 1200      Response to Exercise   Blood Pressure (Admit) 130/76    Blood Pressure (Exercise) 150/86    Blood Pressure (Exit) 118/64    Heart Rate (Admit) 86 bpm    Heart Rate (Exercise) 84 bpm    Heart Rate (Exit) 77 bpm    Oxygen Saturation (Admit) 91 %    Oxygen Saturation (Exercise) 93 %    Oxygen Saturation (Exit) 92 %    Rating of Perceived Exertion (Exercise) 13    Perceived  Dyspnea (Exercise) 3    Duration Continue with 30 min of aerobic exercise without signs/symptoms of physical distress.    Intensity THRR unchanged      Resistance Training    Training Prescription Yes    Weight red bands    Reps 10-15    Time 10 Minutes      Oxygen   Oxygen Continuous    Liters 2      NuStep   SPM 80    Minutes 30    METs 1.4           Nutrition:  Target Goals: Understanding of nutrition guidelines, daily intake of sodium '1500mg'$ , cholesterol '200mg'$ , calories 30% from fat and 7% or less from saturated fats, daily to have 5 or more servings of fruits and vegetables.  Biometrics:  Pre Biometrics - 04/24/20 0914      Pre Biometrics   Height $Remov'6\' 2"'dBVidJ$  (1.88 m)    Weight 133.2 kg    BMI (Calculated) 37.69    Grip Strength 25 kg            Nutrition Therapy Plan and Nutrition Goals:  Nutrition Therapy & Goals - 05/08/20 1041      Personal Nutrition Goals   Nutrition Goal Pt to identify food quantities necessary to achieve weight loss of 6-24 lb at graduation from cardiac rehab.    Personal Goal #2 Pt to build a healthy plate including vegetables, fruits, whole grains, and low-fat dairy products in a heart healthy meal plan.    Personal Goal #3 Pt to reduce sugary beverages to <1 per day      Intervention Plan   Intervention Prescribe, educate and counsel regarding individualized specific dietary modifications aiming towards targeted core components such as weight, hypertension, lipid management, diabetes, heart failure and other comorbidities.;Nutrition handout(s) given to patient.    Expected Outcomes Short Term Goal: A plan has been developed with personal nutrition goals set during dietitian appointment.;Long Term Goal: Adherence to prescribed nutrition plan.           Nutrition Assessments:  MEDIFICTS Score Key:  ?70 Need to make dietary changes   40-70 Heart Healthy Diet  ? 40 Therapeutic Level Cholesterol Diet  Flowsheet Row PULMONARY REHAB OTHER RESPIRATORY from 05/04/2020 in [place]  Picture Your Plate Total Score on Admission 43     Picture Your Plate Scores:  <69  Unhealthy dietary pattern with much room for improvement.  41-50 Dietary pattern unlikely to meet recommendations for good health and room for improvement.  51-60 More healthful dietary pattern, with some room for improvement.   >60 Healthy dietary pattern, although there may be some specific behaviors that could be improved.    Nutrition Goals Re-Evaluation:  Nutrition Goals Re-Evaluation    [place] Name 05/08/20 1042 05/29/20 1205           Goals   Current Weight 293 lb (132.9 kg) 293 lb 14 oz (133.3 kg)      Nutrition Goal Pt to identify food quantities necessary to achieve weight loss of 6-24 lb at graduation from cardiac rehab. Pt to identify food quantities necessary to achieve weight loss of 6-24 lb at graduation from cardiac rehab.      Comment -- No weight loss.             Personal Goal #2 Re-Evaluation   Personal Goal #2 Pt to build a healthy plate including vegetables, fruits, whole grains, and low-fat dairy products in  a heart healthy meal plan. Pt to build a healthy plate including vegetables, fruits, whole grains, and low-fat dairy products in a heart healthy meal plan.             Personal Goal #3 Re-Evaluation   Personal Goal #3 Pt to reduce sugary beverages to <1 per day Pt to reduce sugary beverages to <1 per day             Nutrition Goals Discharge (Final Nutrition Goals Re-Evaluation):  Nutrition Goals Re-Evaluation - 05/29/20 1205      Goals   Current Weight 293 lb 14 oz (133.3 kg)    Nutrition Goal Pt to identify food quantities necessary to achieve weight loss of 6-24 lb at graduation from cardiac rehab.    Comment No weight loss.      Personal Goal #2 Re-Evaluation   Personal Goal #2 Pt to build a healthy plate including vegetables, fruits, whole grains, and low-fat dairy products in a heart healthy meal plan.      Personal Goal #3 Re-Evaluation   Personal Goal #3 Pt to reduce sugary beverages to <1 per day           Psychosocial: Target  Goals: Acknowledge presence or absence of significant depression and/or stress, maximize coping skills, provide positive support system. Participant is able to verbalize types and ability to use techniques and skills needed for reducing stress and depression.  Initial Review & Psychosocial Screening:  Initial Psych Review & Screening - 04/24/20 1012      Initial Review   Current issues with Current Depression;History of Depression;Current Anxiety/Panic;Current Stress Concerns    Source of Stress Concerns Chronic Illness;Unable to perform yard/household activities;Unable to participate in former interests or hobbies;Retirement/disability    Comments Disabled from exposure to agent orange      [place]? Yes   brother     Barriers   Psychosocial barriers to participate in program The patient should benefit from training in stress management and relaxation.      Screening Interventions   Interventions Encouraged to exercise    Expected Outcomes Short Term goal: Utilizing psychosocial counselor, staff and physician to assist with identification of specific Stressors or current issues interfering with healing process. Setting desired goal for each stressor or current issue identified.;Long Term Goal: Stressors or current issues are controlled or eliminated.           Quality of Life Scores:  Scores of 19 and below usually indicate a poorer quality of life in these areas.  A difference of  2-3 points is a clinically meaningful difference.  A difference of 2-3 points in the total score of the Quality of Life Index has been associated with significant improvement in overall quality of life, self-image, physical symptoms, and general health in studies assessing change in quality of life.  PHQ-9: Recent Review Flowsheet Data    Depression screen [facility] 2/9 04/24/2020   Decreased Interest 1   Down, Depressed, Hopeless 1   PHQ - 2 Score 2   Altered sleeping 0   Tired,  decreased energy 3   Change in appetite 0   Feeling bad or failure about yourself  0   Trouble concentrating 0   Moving slowly or fidgety/restless 0   Suicidal thoughts 0   PHQ-9 Score 5   Difficult doing work/chores Somewhat difficult     Interpretation of Total Score  Total Score Depression Severity:  1-4 = Minimal depression, 5-9 =  Mild depression, 10-14 = Moderate depression, 15-19 = Moderately severe depression, 20-27 = Severe depression   Psychosocial Evaluation and Intervention:  Psychosocial Evaluation - 04/24/20 1014      Psychosocial Evaluation & Interventions   Interventions Encouraged to exercise with the program and follow exercise prescription;Stress management education;Relaxation education    Comments Sees a therapist every 3 months, is on lexapro    Expected Outcomes Ability to handle psychosocial issues in healthy ways    Continue Psychosocial Services  Follow up required by staff           Psychosocial Re-Evaluation:  Psychosocial Re-Evaluation    [place] Name 04/24/20 1015 05/22/20 1215           Psychosocial Re-Evaluation   Current issues with Current Depression;History of Depression;Current Stress Concerns;Current Anxiety/Panic Current Depression;History of Depression;Current Stress Concerns;Current Anxiety/Panic      Comments -- Depression is stable at this point, he is being treated for depression through the [place] with medications and therapy.      Expected Outcomes -- For Jack to continue to handle his depression and stress in healthy ways.      Interventions Relaxation education;Stress management education;Encouraged to attend Pulmonary Rehabilitation for the exercise Stress management education;Relaxation education;Encouraged to attend Pulmonary Rehabilitation for the exercise      Continue Psychosocial Services  Follow up required by staff Follow up required by staff             Initial Review   Source of Stress Concerns Chronic Illness;Unable to  perform yard/household activities;Unable to participate in former interests or hobbies;Retirement/disability Chronic Illness;Retirement/disability;Unable to participate in former interests or hobbies;Unable to perform yard/household activities;Financial             Psychosocial Discharge (Final Psychosocial Re-Evaluation):  Psychosocial Re-Evaluation - 05/22/20 1215      Psychosocial Re-Evaluation   Current issues with Current Depression;History of Depression;Current Stress Concerns;Current Anxiety/Panic    Comments Depression is stable at this point, he is being treated for depression through the [place] with medications and therapy.    Expected Outcomes For Shyheem to continue to handle his depression and stress in healthy ways.    Interventions Stress management education;Relaxation education;Encouraged to attend Pulmonary Rehabilitation for the exercise    Continue Psychosocial Services  Follow up required by staff      Initial Review   Source of Stress Concerns Chronic Illness;Retirement/disability;Unable to participate in former interests or hobbies;Unable to perform yard/household activities;Financial           Education: Education Goals: Education classes will be provided on a weekly basis, covering required topics. Participant will state understanding/return demonstration of topics presented.  Learning Barriers/Preferences:  Learning Barriers/Preferences - 04/24/20 1015      Learning Barriers/Preferences   Learning Barriers None    Learning Preferences Written Material;Verbal Instruction;Video;Skilled Demonstration;Pictoral;Individual Instruction;Group Instruction;Audio           Education Topics: Risk Factor Reduction:  -Group instruction that is supported by a PowerPoint presentation. Instructor discusses the definition of a risk factor, different risk factors for pulmonary disease, and how the heart and lungs work together.     Nutrition for Pulmonary Patient:  -Group  instruction provided by PowerPoint slides, verbal discussion, and written materials to support subject matter. The instructor gives an explanation and review of healthy diet recommendations, which includes a discussion on weight management, recommendations for fruit and vegetable consumption, as well as protein, fluid, caffeine, fiber, sodium, sugar, and alcohol. Tips for eating when patients are  short of breath are discussed.   Pursed Lip Breathing:  -Group instruction that is supported by demonstration and informational handouts. Instructor discusses the benefits of pursed lip and diaphragmatic breathing and detailed demonstration on how to preform both.     Oxygen Safety:  -Group instruction provided by PowerPoint, verbal discussion, and written material to support subject matter. There is an overview of "What is Oxygen" and "Why do we need it".  Instructor also reviews how to create a safe environment for oxygen use, the importance of using oxygen as prescribed, and the risks of noncompliance. There is a brief discussion on traveling with oxygen and resources the patient may utilize.   Oxygen Equipment:  -Group instruction provided by [facility] Staff utilizing handouts, written materials, and equipment demonstrations.   Signs and Symptoms:  -Group instruction provided by written material and verbal discussion to support subject matter. Warning signs and symptoms of infection, stroke, and heart attack are reviewed and when to call the physician/911 reinforced. Tips for preventing the spread of infection discussed.   Advanced Directives:  -Group instruction provided by verbal instruction and written material to support subject matter. Instructor reviews Advanced Directive laws and proper instruction for filling out document.   Pulmonary Video:  -Group video education that reviews the importance of medication and oxygen compliance, exercise, good nutrition, pulmonary hygiene, and pursed  lip and diaphragmatic breathing for the pulmonary patient.   Exercise for the Pulmonary Patient:  -Group instruction that is supported by a PowerPoint presentation. Instructor discusses benefits of exercise, core components of exercise, frequency, duration, and intensity of an exercise routine, importance of utilizing pulse oximetry during exercise, safety while exercising, and options of places to exercise outside of rehab.   Flowsheet Row PULMONARY REHAB OTHER RESPIRATORY from 05/25/2020 in [facility] CARDIAC REHAB  Date 05/11/20  Educator handout      Pulmonary Medications:  -Verbally interactive group education provided by instructor with focus on inhaled medications and proper administration.   Anatomy and Physiology of the Respiratory System and Intimacy:  -Group instruction provided by PowerPoint, verbal discussion, and written material to support subject matter. Instructor reviews respiratory cycle and anatomical components of the respiratory system and their functions. Instructor also reviews differences in obstructive and restrictive respiratory diseases with examples of each. Intimacy, Sex, and Sexuality differences are reviewed with a discussion on how relationships can change when diagnosed with pulmonary disease. Common sexual concerns are reviewed.   MD DAY -A group question and answer session with a medical doctor that allows participants to ask questions that relate to their pulmonary disease state.   OTHER EDUCATION -Group or individual verbal, written, or video instructions that support the educational goals of the pulmonary rehab program. Flowsheet Row PULMONARY REHAB OTHER RESPIRATORY from 05/25/2020 in [facility] CARDIAC REHAB  Date 05/25/20  Arlyn Dunning Reading]  Educator handout      Holiday Eating Survival Tips:  -Group instruction provided by PowerPoint slides, verbal discussion, and written materials to support subject matter.  The instructor gives patients tips, tricks, and techniques to help them not only survive but enjoy the holidays despite the onslaught of food that accompanies the holidays.   Knowledge Questionnaire Score:  Knowledge Questionnaire Score - 04/24/20 1227      Knowledge Questionnaire Score   Pre Score 14/18           Core Components/Risk Factors/Patient Goals at Admission:  Personal Goals and Risk Factors at Admission - 04/24/20 1015  Core Components/Risk Factors/Patient Goals on Admission   Improve shortness of breath with ADL's Yes    Intervention Provide education, individualized exercise plan and daily activity instruction to help decrease symptoms of SOB with activities of daily living.    Expected Outcomes Short Term: Improve cardiorespiratory fitness to achieve a reduction of symptoms when performing ADLs;Long Term: Be able to perform more ADLs without symptoms or delay the onset of symptoms           Core Components/Risk Factors/Patient Goals Review:   Goals and Risk Factor Review    Row Name 04/24/20 1016 05/22/20 1217           Core Components/Risk Factors/Patient Goals Review   Personal Goals Review Develop more efficient breathing techniques such as purse lipped breathing and diaphragmatic breathing and practicing self-pacing with activity.;Increase knowledge of respiratory medications and ability to use respiratory devices properly.;Improve shortness of breath with ADL's Develop more efficient breathing techniques such as purse lipped breathing and diaphragmatic breathing and practicing self-pacing with activity.;Increase knowledge of respiratory medications and ability to use respiratory devices properly.;Improve shortness of breath with ADL's      Review -- Netty Starring is 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  He requires 2L of supplemental oxygen to exercise in pulmonary rehab.  [facility] to [facility] him for oxgen  at home. He has been taught purse lip breathing to use when exercising .      Expected Outcomes -- See admission goals.             Core Components/Risk Factors/Patient Goals at Discharge (Final Review):   Goals and Risk Factor Review - 05/22/20 1217      Core Components/Risk Factors/Patient Goals Review   Personal Goals Review Develop more efficient breathing techniques such as purse lipped breathing and diaphragmatic breathing and practicing self-pacing with activity.;Increase knowledge of respiratory medications and ability to use respiratory devices properly.;Improve shortness of breath with ADL's    Review Netty Starring is 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  He requires 2L of supplemental oxygen to exercise in pulmonary rehab.  [facility] to [facility] him for oxgen at home. He has been taught purse lip breathing to use when exercising .    Expected Outcomes See admission goals.           ITP Comments:   Comments:  Davion has completed 8 exercise sessions in Pulmonary rehab. Pt maintains good attendance and consistent home exercise. Pulmonary rehab staff will continue to monitor and reassess progress toward goals during her participation in Pulmonary Rehab.

## 2020-05-31 ENCOUNTER — Ambulatory Visit (INDEPENDENT_AMBULATORY_CARE_PROVIDER_SITE_OTHER): Payer: No Typology Code available for payment source | Admitting: Plastic Surgery

## 2020-05-31 DIAGNOSIS — L910 Hypertrophic scar: Secondary | ICD-10-CM | POA: Diagnosis not present

## 2020-05-31 NOTE — Progress Notes (Signed)
   Referring Provider Sonia Side., FNP [place],  [place] [id number]   CC:  Chief Complaint  Patient presents with  . Follow-up      Jeremy Landry is an 75 y.o. male.  HPI: Patient presents 4 months out from excision of keloid right neck.  He felt like it was thickening up and wants to have it evaluated.  Otherwise doing well.  Review of Systems General: Denies fevers and chills  Physical Exam Vitals with BMI 05/23/2020 05/09/2020 05/08/2020  Height - - -  Weight 293 lbs 14 oz 290 lbs 2 oz 293 lbs  BMI 37.72 91.69 45.0  Systolic - - -  Diastolic - - -  Pulse - - -    General:  No acute distress,  Alert and oriented, Non-Toxic, Normal speech and affect Examination of the right neck shows a little bit of thickening to his scar.  This is around a 3 cm transverse scar in the right lateral neck.  Could be early keloid recurrence.  Assessment/Plan Have done a discussion with patient about the various options.  I ultimately recommended steroid injection.  We discussed the risks and benefits and he is interested in moving forward.  1 cc of Kenalog 40 was mixed with 1 cc of lidocaine with epinephrine and this was injected along and into the scar with no issues.  I will plan to see him again in 2 months to check his progress.  All of his questions were answered.  Cindra Presume 05/31/2020, 5:10 PM

## 2020-06-01 ENCOUNTER — Other Ambulatory Visit: Payer: Self-pay

## 2020-06-01 ENCOUNTER — Encounter (HOSPITAL_COMMUNITY)
Admission: RE | Admit: 2020-06-01 | Discharge: 2020-06-01 | Disposition: A | Discharge status: Home / Self Care | Payer: No Typology Code available for payment source | Source: Ambulatory Visit | Attending: Cardiology | Admitting: Cardiology

## 2020-06-01 DIAGNOSIS — R0600 Dyspnea, unspecified: Secondary | ICD-10-CM

## 2020-06-01 DIAGNOSIS — R0609 Other forms of dyspnea: Secondary | ICD-10-CM

## 2020-06-01 NOTE — Progress Notes (Signed)
Nutrition Note: Follow up   Spoke with pt during exercise. He is still wanting to lose weight. Weight has been maintained. He states biggest barrier/frustration is difficulties exercising due to dyspnea/discomfort. We discussed diet. He is doing 1-2 SSB/day. We discussed reducing SSB and choosing a sugar free option as well as more water. He is very stressed and reports stress eating. He has been buying fresh fruits, veggies, and nuts. Discussed high calorie foods vs low calorie foods for snacking. Provided recommendations for snacks and alternative beverages.  Provided pt with weight loss tips and 2200 cal meal plan.   Nutrition Diagnosis    Obese  II = 35-39.9 related to excessive energy intake and increased sedentary behaviors as evidenced by a  BMI 37.62 kg/m2, pt reported weight gain, poor food choices   Nutrition Intervention   Pt's individual nutrition plan reviewed with pt.  Label reading, healthy beverages, plate method, estimated energy intake 2300 for weight loss                    Continue client-centered nutrition education by RD, as part of interdisciplinary care.  Goal(s)  Pt to identify food quantities necessary to achieve weight loss of 6-24 lb at graduation from cardiac rehab.   Pt to build a healthy plate including vegetables, fruits, whole grains, and low-fat dairy products in a heart healthy meal plan.  Pt to reduce sugary beverages to <1 per day Plan:  Will provide client-centered nutrition education as part of interdisciplinary care  Monitor and evaluate progress toward nutrition goal with team.   Michaele Offer, MS, RDN, LDN

## 2020-06-01 NOTE — Progress Notes (Signed)
Daily Session Note  Patient Details  Name: Jeremy Landry MRN: [id number] Date of Birth: June 17, 1945 Referring Provider:   [name] Pulmonary Rehab Walk Test from 04/24/2020 in [place]  Referring Provider Dr. Radford Pax ([place])      Encounter Date: 06/01/2020  Check In:  Session Check In - 06/01/20 1155      Check-In   Supervising physician immediately available to respond to emergencies [facility] immediately available    Physician(s) Dr. Maren Beach    Location [facility]    Staff Present Leda Roys, MS, ACSM-CEP, Exercise Physiologist;Other;Lisa Lizbeth Bark, MS, ACSM-CEP, CCRP, Exercise Physiologist    Virtual Visit No    Medication changes reported     No    Fall or balance concerns reported    No    Tobacco Cessation No Change    Warm-up and Cool-down Performed as group-led instruction    Resistance Training Performed Yes    VAD Patient? No    PAD/SET Patient? No      Pain Assessment   Currently in Pain? Yes    Pain Score 10-Worst pain ever    Pain Location Shoulder    Pain Orientation Right    Pain Descriptors / Indicators Aching;Constant;Throbbing    Pain Type Chronic pain    Pain Onset More than a month ago    Multiple Pain Sites No           Capillary Blood Glucose: No results found for this or any previous visit (from the past 24 hour(s)).    Social History   Tobacco Use  Smoking Status Former Smoker  . Packs/day: 1.00  . Years: 40.00  . Pack years: 40.00  . Quit date: 07/23/2014  . Years since quitting: 5.8  Smokeless Tobacco Never Used    Goals Met:  Proper associated with RPD/PD & O2 Sat Exercise tolerated well No report of cardiac concerns or symptoms Strength training completed today  Goals Unmet:  Not Applicable  Comments: Service time is from 1015 to 1130    Dr. Fransico Him is Medical Director for Cardiac Rehab at [facility].

## 2020-06-06 ENCOUNTER — Encounter (HOSPITAL_COMMUNITY)
Admission: RE | Admit: 2020-06-06 | Discharge: 2020-06-06 | Disposition: A | Discharge status: Home / Self Care | Payer: No Typology Code available for payment source | Source: Ambulatory Visit | Attending: Cardiology | Admitting: Cardiology

## 2020-06-06 ENCOUNTER — Other Ambulatory Visit: Payer: Self-pay

## 2020-06-06 VITALS — Wt 292.3 lb

## 2020-06-06 DIAGNOSIS — R0609 Other forms of dyspnea: Secondary | ICD-10-CM

## 2020-06-06 DIAGNOSIS — R0600 Dyspnea, unspecified: Secondary | ICD-10-CM

## 2020-06-06 NOTE — Progress Notes (Signed)
I have reviewed a Home Exercise Prescription with Rayford Halsted II . Koua is currently exercising at home. He states he does the resistance and cool down exercises at home occassionally. The patient was advised to continue with resistance band exercises 2-3 days a week for 15-30 minutes. We are currently in the process of trying to get him supplemental oxygen through the [place]. Based off of our walk test he requires 2L of continuous oxygen during exertion. I do not feel comfortable giving him a home exercise prescription for anything other than seated band exercises knowing that his oxygen saturation drops below normal values with exertion.  Ugo and I discussed how to progress their exercise prescription.  The patient stated that their goals were lose weight and breath better. The patient stated that they understand the exercise prescription.  We reviewed exercise guidelines, target heart rate during exercise, RPE Scale, weather conditions, endpoints for exercise, warmup and cool down.  Patient is encouraged to come to me with any questions. I will continue to follow up with the patient to assist them with progression and safety.    Rick Duff MS, ACSM CEP

## 2020-06-06 NOTE — Progress Notes (Signed)
Daily Session Note  Patient Details  Name: Jeremy Landry MRN: [id number] Date of Birth: [birth year] Referring Provider:   [name] Pulmonary Rehab Walk Test from 04/24/2020 in [place]  Referring Provider Dr. Radford Pax ([place])      Encounter Date: 06/06/2020  Check In:  Session Check In - 06/06/20 1122      Check-In   Supervising physician immediately available to respond to emergencies [facility] immediately available    Physician(s) Dr. Myna Hidalgo    Staff Present Rosebud Poles, RN, Isaac Laud, MS, ACSM-CEP, Exercise Physiologist;Carlette Wilber Oliphant, RN, BSN;Ramon Dredge, RN, MHA    Virtual Visit No    Medication changes reported     No    Fall or balance concerns reported    No    Tobacco Cessation No Change    Warm-up and Cool-down Performed as group-led instruction    Resistance Training Performed Yes    VAD Patient? No    PAD/SET Patient? No      Pain Assessment   Currently in Pain? No/denies    Multiple Pain Sites No           Capillary Blood Glucose: No results found for this or any previous visit (from the past 24 hour(s)).   Exercise Prescription Changes - 06/06/20 1200      Response to Exercise   Blood Pressure (Admit) 148/82    Blood Pressure (Exercise) 140/70    Blood Pressure (Exit) 124/70    Heart Rate (Admit) 76 bpm    Heart Rate (Exercise) 74 bpm    Heart Rate (Exit) 76 bpm    Oxygen Saturation (Admit) 93 %    Oxygen Saturation (Exercise) 92 %    Oxygen Saturation (Exit) 96 %    Rating of Perceived Exertion (Exercise) 13    Perceived Dyspnea (Exercise) 3    Duration Continue with 30 min of aerobic exercise without signs/symptoms of physical distress.    Intensity THRR unchanged      Progression   Progression Continue to progress workloads to maintain intensity without signs/symptoms of physical distress.      Resistance Training   Training Prescription Yes    Weight red bands    Reps 10-15     Time 10 Minutes      Oxygen   Oxygen Continuous    Liters 2      NuStep   Level 2    SPM 80    Minutes 30    METs 1.6           Social History   Tobacco Use  Smoking Status Former Smoker  . Packs/day: 1.00  . Years: 40.00  . Pack years: 40.00  . Quit date: 07/23/2014  . Years since quitting: 5.8  Smokeless Tobacco Never Used    Goals Met:  Proper associated with RPD/PD & O2 Sat Exercise tolerated well No report of cardiac concerns or symptoms Strength training completed today  Goals Unmet:  Not Applicable  Comments: Service time is from 1015 to 1124    Dr. Fransico Him is Medical Director for Cardiac Rehab at [facility].

## 2020-06-08 ENCOUNTER — Other Ambulatory Visit: Payer: Self-pay

## 2020-06-08 ENCOUNTER — Encounter (HOSPITAL_COMMUNITY)
Admission: RE | Admit: 2020-06-08 | Discharge: 2020-06-08 | Disposition: A | Discharge status: Home / Self Care | Payer: No Typology Code available for payment source | Source: Ambulatory Visit | Attending: Cardiology | Admitting: Cardiology

## 2020-06-08 DIAGNOSIS — R0609 Other forms of dyspnea: Secondary | ICD-10-CM

## 2020-06-08 DIAGNOSIS — R0600 Dyspnea, unspecified: Secondary | ICD-10-CM | POA: Diagnosis present

## 2020-06-08 NOTE — Progress Notes (Signed)
Daily Session Note  Patient Details  Name: Jeremy Landry MRN: [id number] Date of Birth: 1945-05-24 Referring Provider:   [name] Pulmonary Rehab Walk Test from 04/24/2020 in [place]  Referring Provider Dr. Radford Pax ([place])      Encounter Date: 06/08/2020  Check In:  Session Check In - 06/08/20 1152      Check-In   Supervising physician immediately available to respond to emergencies [facility] immediately available    Physician(s) Dr. Tana Coast    Location [facility]    Staff Present Leda Roys, MS, ACSM-CEP, Exercise Physiologist;Annedrea Rosezella [place], RN, MHA;Carlette Wilber Oliphant, RN, BSN    Virtual Visit No    Medication changes reported     No    Fall or balance concerns reported    No    Tobacco Cessation No Change    Warm-up and Cool-down Performed on first and last piece of equipment    Resistance Training Performed Yes    VAD Patient? No    PAD/SET Patient? No      Pain Assessment   Currently in Pain? No/denies    Pain Score 0-No pain    Multiple Pain Sites No           Capillary Blood Glucose: No results found for this or any previous visit (from the past 24 hour(s)).    Social History   Tobacco Use  Smoking Status Former Smoker  . Packs/day: 1.00  . Years: 40.00  . Pack years: 40.00  . Quit date: 07/23/2014  . Years since quitting: 5.8  Smokeless Tobacco Never Used    Goals Met:  Proper associated with RPD/PD & O2 Sat Exercise tolerated well No report of cardiac concerns or symptoms Strength training completed today  Goals Unmet:  Not Applicable  Comments: Service time is from 1015 to 1130    Dr. Fransico Him is Medical Director for Cardiac Rehab at [facility].

## 2020-06-13 ENCOUNTER — Encounter (HOSPITAL_COMMUNITY)
Admission: RE | Admit: 2020-06-13 | Discharge: 2020-06-13 | Disposition: A | Discharge status: Home / Self Care | Payer: No Typology Code available for payment source | Source: Ambulatory Visit | Attending: Cardiology | Admitting: Cardiology

## 2020-06-13 ENCOUNTER — Other Ambulatory Visit: Payer: Self-pay

## 2020-06-13 DIAGNOSIS — R0600 Dyspnea, unspecified: Secondary | ICD-10-CM | POA: Diagnosis not present

## 2020-06-13 DIAGNOSIS — R0609 Other forms of dyspnea: Secondary | ICD-10-CM

## 2020-06-13 NOTE — Progress Notes (Signed)
Daily Session Note  Patient Details  Name: ARSLAN KIER MRN: [id number] Date of Birth: 04/24/1945 Referring Provider:   [name] Pulmonary Rehab Walk Test from 04/24/2020 in [place]  Referring Provider Dr. Radford Pax ([place])      Encounter Date: 06/13/2020  Check In:  Session Check In - 06/13/20 1149      Check-In   Supervising physician immediately available to respond to emergencies [facility] immediately available    Physician(s) Dr. Tana Coast    Location [facility]    Staff Present Leda Roys, MS, ACSM-CEP, Exercise Physiologist;Other;Lafreda Casebeer Lizbeth Bark, MS, ACSM-CEP, CCRP, Exercise Physiologist    Virtual Visit No    Medication changes reported     No    Fall or balance concerns reported    No    Tobacco Cessation No Change    Warm-up and Cool-down Performed as group-led instruction    Resistance Training Performed Yes    VAD Patient? No    PAD/SET Patient? No      Pain Assessment   Currently in Pain? No/denies    Multiple Pain Sites No           Capillary Blood Glucose: No results found for this or any previous visit (from the past 24 hour(s)).    Social History   Tobacco Use  Smoking Status Former Smoker  . Packs/day: 1.00  . Years: 40.00  . Pack years: 40.00  . Quit date: 07/23/2014  . Years since quitting: 5.8  Smokeless Tobacco Never Used    Goals Met:  Exercise tolerated well No report of cardiac concerns or symptoms Strength training completed today  Goals Unmet:  Not Applicable  Comments: Service time is from 1015 to 1125    Dr. Fransico Him is Medical Director for Cardiac Rehab at [facility].

## 2020-06-15 ENCOUNTER — Encounter (HOSPITAL_COMMUNITY)
Admission: RE | Admit: 2020-06-15 | Discharge: 2020-06-15 | Disposition: A | Discharge status: Home / Self Care | Payer: No Typology Code available for payment source | Source: Ambulatory Visit | Attending: Cardiology | Admitting: Cardiology

## 2020-06-15 ENCOUNTER — Other Ambulatory Visit: Payer: Self-pay

## 2020-06-15 DIAGNOSIS — R0609 Other forms of dyspnea: Secondary | ICD-10-CM

## 2020-06-15 DIAGNOSIS — R0600 Dyspnea, unspecified: Secondary | ICD-10-CM

## 2020-06-15 NOTE — Progress Notes (Signed)
Daily Session Note  Patient Details  Name: Jeremy Landry MRN: [id number] Date of Birth: 1945/11/29 Referring Provider:   [name] Pulmonary Rehab Walk Test from 04/24/2020 in [place]  Referring Provider Dr. Radford Pax ([place])       Encounter Date: 06/15/2020  Check In:  Session Check In - 06/15/20 1122       Check-In   Supervising physician immediately available to respond to emergencies [facility] immediately available    Physician(s) Dr, Lonny Prude    Location [facility]    Staff Present Rodney Langton, RN;Other;Jessica Hassell Done, MS, ACSM-CEP, Exercise Physiologist    Virtual Visit No    Medication changes reported     No    Fall or balance concerns reported    No    Tobacco Cessation No Change    Warm-up and Cool-down Performed as group-led instruction    Resistance Training Performed Yes    VAD Patient? No    PAD/SET Patient? No      Pain Assessment   Currently in Pain? Yes    Pain Score 10-Worst pain ever    Pain Location Other (Comment)    Pain Orientation Right    Pain Descriptors / Indicators Aching;Pressure    Pain Type Chronic pain    Pain Onset More than a month ago    Pain Frequency Constant    Effect of Pain on Daily Activities none    Multiple Pain Sites No             Capillary Blood Glucose: No results found for this or any previous visit (from the past 24 hour(s)).    Social History   Tobacco Use  Smoking Status Former   Packs/day: 1.00   Years: 40.00   Pack years: 40.00   Types: Cigarettes   Quit date: 07/23/2014   Years since quitting: 5.9  Smokeless Tobacco Never    Goals Met:  Exercise tolerated well No report of cardiac concerns or symptoms Strength training completed today  Goals Unmet:  Not Applicable  Comments:Service time is from 1015 to 1127    Dr. Fransico Him is Medical Director for Cardiac Rehab at [facility].

## 2020-06-20 ENCOUNTER — Encounter (HOSPITAL_COMMUNITY): Payer: No Typology Code available for payment source

## 2020-06-20 ENCOUNTER — Encounter (HOSPITAL_COMMUNITY)
Admission: RE | Admit: 2020-06-20 | Discharge: 2020-06-20 | Disposition: A | Discharge status: Home / Self Care | Payer: No Typology Code available for payment source | Source: Ambulatory Visit | Attending: Cardiology | Admitting: Cardiology

## 2020-06-20 ENCOUNTER — Other Ambulatory Visit: Payer: Self-pay

## 2020-06-20 VITALS — Wt 288.8 lb

## 2020-06-20 DIAGNOSIS — R0600 Dyspnea, unspecified: Secondary | ICD-10-CM

## 2020-06-20 DIAGNOSIS — R0609 Other forms of dyspnea: Secondary | ICD-10-CM

## 2020-06-20 NOTE — Progress Notes (Signed)
Daily Session Note  Patient Details  Name: Jeremy Landry MRN: [id number] Date of Birth: 1945/09/25 Referring Provider:   [name] Pulmonary Rehab Walk Test from 04/24/2020 in [place]  Referring Provider Dr. Radford Pax ([place])       Encounter Date: 06/20/2020  Check In:  Session Check In - 06/20/20 1147       Check-In   Supervising physician immediately available to respond to emergencies [facility] immediately available    Physician(s) Dr. Lonny Prude    [facility]    Staff Present Rosebud Poles, RN, Isaac Laud, MS, ACSM-CEP, Exercise Physiologist;Other;Lisa Ysidro Evert, RN    Virtual Visit No    Medication changes reported     No    Fall or balance concerns reported    No    Tobacco Cessation No Change    Warm-up and Cool-down Performed as group-led instruction    Resistance Training Performed No    VAD Patient? No    PAD/SET Patient? No      Pain Assessment   Currently in Pain? No/denies    Pain Orientation Right    Pain Descriptors / Indicators Aching;Nagging    Pain Type Chronic pain    Pain Onset More than a month ago    Pain Frequency Intermittent    Multiple Pain Sites No             Capillary Blood Glucose: No results found for this or any previous visit (from the past 24 hour(s)).   Exercise Prescription Changes - 06/20/20 1100       Response to Exercise   Blood Pressure (Admit) 158/84    Blood Pressure (Exercise) 138/88    Blood Pressure (Exit) 144/84    Heart Rate (Admit) 80 bpm    Heart Rate (Exercise) 88 bpm    Heart Rate (Exit) 72 bpm    Oxygen Saturation (Admit) 96 %    Oxygen Saturation (Exercise) 94 %    Oxygen Saturation (Exit) 96 %    Rating of Perceived Exertion (Exercise) 13    Perceived Dyspnea (Exercise) 3    Duration Continue with 30 min of aerobic exercise without signs/symptoms of physical distress.    Intensity THRR unchanged      Progression   Progression  Continue to progress workloads to maintain intensity without signs/symptoms of physical distress.      Resistance Training   Training Prescription Yes    Weight red bands    Reps 10-15    Time 10 Minutes      Oxygen   Oxygen Continuous    Liters 2      NuStep   Level 2    SPM 80    Minutes 30    METs 1.6             Social History   Tobacco Use  Smoking Status Former   Packs/day: 1.00   Years: 40.00   Pack years: 40.00   Types: Cigarettes   Quit date: 07/23/2014   Years since quitting: 5.9  Smokeless Tobacco Never    Goals Met:  Proper associated with RPD/PD & O2 Sat Exercise tolerated well No report of cardiac concerns or symptoms Strength training completed today  Goals Unmet:  Not Applicable  Comments: Service time is from 1015 to 43     Dr. Fransico Him is Medical Director for Cardiac Rehab at [facility].

## 2020-06-22 ENCOUNTER — Encounter (HOSPITAL_COMMUNITY): Payer: No Typology Code available for payment source

## 2020-06-22 ENCOUNTER — Encounter (HOSPITAL_COMMUNITY)
Admission: RE | Admit: 2020-06-22 | Discharge: 2020-06-22 | Disposition: A | Discharge status: Home / Self Care | Payer: No Typology Code available for payment source | Source: Ambulatory Visit | Attending: Cardiology | Admitting: Cardiology

## 2020-06-22 ENCOUNTER — Other Ambulatory Visit: Payer: Self-pay

## 2020-06-22 DIAGNOSIS — R0600 Dyspnea, unspecified: Secondary | ICD-10-CM

## 2020-06-22 DIAGNOSIS — R0609 Other forms of dyspnea: Secondary | ICD-10-CM

## 2020-06-22 NOTE — Progress Notes (Signed)
Daily Session Note  Patient Details  Name: Jeremy Landry MRN: [id number] Date of Birth: July 02, 1945 Referring Provider:   [name] Pulmonary Rehab Walk Test from 04/24/2020 in [place]  Referring Provider Dr. Radford Pax ([place])       Encounter Date: 06/22/2020  Check In:  Session Check In - 06/22/20 1032       Check-In   Supervising physician immediately available to respond to emergencies [facility] immediately available    Physician(s) Dr. Arbutus Ped    Location [facility]    Staff Present Rosebud Poles, RN, BSN;Miryah Ralls Ysidro Evert, Felipe Drone, RN, MHA;Other    Virtual Visit No    Medication changes reported     No    Fall or balance concerns reported    No    Tobacco Cessation No Change    Warm-up and Cool-down Performed as group-led instruction    Resistance Training Performed Yes    VAD Patient? No    PAD/SET Patient? No      Pain Assessment   Currently in Pain? No/denies    Multiple Pain Sites No             Capillary Blood Glucose: No results found for this or any previous visit (from the past 24 hour(s)).    Social History   Tobacco Use  Smoking Status Former   Packs/day: 1.00   Years: 40.00   Pack years: 40.00   Types: Cigarettes   Quit date: 07/23/2014   Years since quitting: 5.9  Smokeless Tobacco Never    Goals Met:  Exercise tolerated well No report of cardiac concerns or symptoms Strength training completed today  Goals Unmet:  Not Applicable  Comments: Service time is from 1012 to 1130    Dr. Fransico Him is Medical Director for Cardiac Rehab at [facility].

## 2020-06-27 ENCOUNTER — Encounter (HOSPITAL_COMMUNITY): Payer: No Typology Code available for payment source

## 2020-06-27 ENCOUNTER — Encounter (HOSPITAL_COMMUNITY)
Admission: RE | Admit: 2020-06-27 | Discharge: 2020-06-27 | Disposition: A | Discharge status: Home / Self Care | Payer: No Typology Code available for payment source | Source: Ambulatory Visit | Attending: Cardiology | Admitting: Cardiology

## 2020-06-27 ENCOUNTER — Other Ambulatory Visit: Payer: Self-pay

## 2020-06-27 DIAGNOSIS — R0609 Other forms of dyspnea: Secondary | ICD-10-CM

## 2020-06-27 DIAGNOSIS — R0600 Dyspnea, unspecified: Secondary | ICD-10-CM | POA: Diagnosis not present

## 2020-06-27 NOTE — Progress Notes (Signed)
Daily Session Note  Patient Details  Name: Jeremy Landry MRN: [id number] Date of Birth: 1945-09-26 Referring Provider:   [name] Pulmonary Rehab Walk Test from 04/24/2020 in [place]  Referring Provider Dr. Radford Pax ([place])       Encounter Date: 06/27/2020  Check In:  Session Check In - 06/27/20 1128       Check-In   Supervising physician immediately available to respond to emergencies [facility] immediately available    Physician(s) Dr. Arbutus Ped    Location [facility]    Staff Present Rosebud Poles, RN, BSN;Meredith Rosana Hoes RD, LDN;Lisa Ysidro Evert, RN;Jessica Hassell Done, MS, ACSM-CEP, Exercise Physiologist;Carlette Wilber Oliphant, RN, BSN;Ramon Dredge, RN, MHA    Virtual Visit No    Medication changes reported     No    Fall or balance concerns reported    No    Tobacco Cessation No Change    Warm-up and Cool-down Performed as group-led instruction    Resistance Training Performed Yes    VAD Patient? No    PAD/SET Patient? No      Pain Assessment   Currently in Pain? No/denies    Multiple Pain Sites No             Capillary Blood Glucose: No results found for this or any previous visit (from the past 24 hour(s)).    Social History   Tobacco Use  Smoking Status Former   Packs/day: 1.00   Years: 40.00   Pack years: 40.00   Types: Cigarettes   Quit date: 07/23/2014   Years since quitting: 5.9  Smokeless Tobacco Never    Goals Met:  Proper associated with RPD/PD & O2 Sat Exercise tolerated well Strength training completed today  Goals Unmet:  Not Applicable  Comments: Service time is from 1015 to 1140    Dr. Fransico Him is Medical Director for Cardiac Rehab at [facility].

## 2020-06-27 NOTE — Progress Notes (Signed)
Pulmonary Individual Treatment Plan  Patient Details  Name: Jeremy Landry MRN: [id number] Date of Birth: [birth year] Referring Provider:   Doristine Devoid Pulmonary Rehab Walk Test from 04/24/2020 in [facility] CARD[facility] REHAB  Ref[facility] Provider [facility] Mayford Knife ([facility])       Initial Encounter Date:  Flowsheet Row Pulmonary Rehab Walk Test from 04/24/2020 in [facility] CARDIAC REHAB  Date 04/24/20       Visit Diagnosis: Dyspnea on exertion  Patient's Home Medications on Admission:   Current Outpatient Medications:    albuterol (VENTOLIN HFA) 108 (90 Base) MCG/ACT inhaler, Inhale 2 puffs into the lungs every 6 (six) hours as needed for wheezing or shortness of breath., Disp: , Rfl:    atorvastatin (LIPITOR) 40 MG tablet, Take 40 mg by mouth daily., Disp: , Rfl:    brimonidine (ALPHAGAN P) 0.1 % SOLN, Place 1 drop into both eyes 2 (two) times daily., Disp: , Rfl:    carboxymethylcellulose (REFRESH PLUS) 0.5 % SOLN, Place 1 drop into both eyes 3 (three) times daily as needed (dry eyes)., Disp: , Rfl:    Cholecalciferol 50 MCG (2000 UT) TABS, Take 2,000 Units by mouth daily., Disp: , Rfl:    clopidogrel (PLAVIX) 75 MG tablet, Take 75 mg by mouth daily., Disp: , Rfl:    escitalopram (LEXAPRO) 10 MG tablet, Take 10 mg by mouth daily., Disp: , Rfl:    finasteride (PROSCAR) 5 MG tablet, Take 5 mg by mouth daily., Disp: , Rfl:    HYDROcodone-acetaminophen (NORCO) 5-325 MG tablet, Take 1 tablet by mouth every 6 (six) hours as needed for moderate pain., Disp: 5 tablet, Rfl: 0   nitroGLYCERIN (NITROSTAT) 0.4 MG SL tablet, Place 0.4 mg under the tongue every 5 (five) minutes as needed for chest pain., Disp: , Rfl:    omeprazole (PRILOSEC) 20 MG capsule, Take 20 mg by mouth daily., Disp: , Rfl:    oxyCODONE (OXY IR/ROXICODONE) 5 MG immediate release tablet, Take 1 tablet (5 mg total) by mouth every 6 (six) hours as needed for severe pain., Disp: 15 tablet, Rfl: 0   OXYGEN,  Inhale 3-4 L/min into the lungs as needed (short of breath). , Disp: , Rfl:    prazosin (MINIPRESS) 1 MG capsule, Take 1 mg by mouth at bedtime., Disp: , Rfl:    terazosin (HYTRIN) 5 MG capsule, Take 5 mg by mouth at bedtime., Disp: , Rfl:    traZODone (DESYREL) 50 MG tablet, Take 50 mg by mouth at bedtime as needed for sleep., Disp: , Rfl:   Past Medical History: Past Medical History:  Diagnosis Date   Alcoholism (HCC)    Anxiety    Arthritis    Chronic headaches    Congestive heart failure (HCC)    COPD (chronic obstructive pulmonary disease) (HCC)    Depression    GERD (gastroesophageal reflux disease)    Glaucoma    History of kidney stones    Hypertension    Kidney stones    Lung cancer (HCC)    Pneumonia    Prostate cancer (HCC)    Stroke (HCC)    " small stroke one time "   Wears glasses    Wears partial dentures     Tobacco Use: Social History   Tobacco Use  Smoking Status Former   Packs/day: 1.00   Years: 40.00   Pack years: 40.00   Types: Cigarettes   Quit date: 07/23/2014   Years since quitting: 5.9  Smokeless Tobacco Never    Labs: Recent Review Flowsheet Data     Labs for ITP Cardiac and Pulmonary Rehab Latest Ref Rng & Units 05/24/2010 08/12/2014 08/14/2014 10/21/2014   Cholestrol 0 - 200 mg/dL 121 156 165 -   LDLCALC 0 - 99 mg/dL 53 ... 92 109(H) -   HDL >40 mg/dL 38(L) 31(L) 41 -   Trlycerides <150 mg/dL 152(H) 167(H) 75 -   Hemoglobin A1c 4.8 - 5.6 % 5.1 ... 5.7(H) 5.5 -   PHART 7.350 - 7.450 - - - 7.410   PCO2ART 35.0 - 45.0 mmHg - - - 39.9   HCO3 20.0 - 24.0 mEq/L - - - 25.4(H)   TCO2 0 - 100 mmol/L - - - 27   O2SAT % - - - 93.0       Capillary Blood Glucose: Lab Results  Component Value Date   GLUCAP 110 (H) 01/30/2017   GLUCAP 151 (H) 08/16/2014   GLUCAP 119 (H) 08/15/2014   GLUCAP 148 (H) 08/14/2014   GLUCAP 144 (H) 08/13/2014     Pulmonary Assessment Scores:  Pulmonary Assessment Scores     Row Name 04/24/20 1215 04/24/20  1226       ADL UCSD   ADL Phase Entry Entry    SOB Score total -- 84         CAT Score      CAT Score -- 32         mMRC Score      mMRC Score 4 --           UCSD: Self-administered rating of dyspnea associated with activities of daily living (ADLs) 6-point scale (0 = "not at all" to 5 = "maximal or unable to do because of breathlessness")  Scoring Scores range from 0 to 120.  Minimally important difference is 5 units  CAT: CAT can identify the health impairment of COPD patients and is better correlated with disease progression.  CAT has a scoring range of zero to 40. The CAT score is classified into four groups of low (less than 10), medium (10 - 20), high (21-30) and very high (31-40) based on the impact level of disease on health status. A CAT score over 10 suggests significant symptoms.  A worsening CAT score could be explained by an exacerbation, poor medication adherence, poor inhaler technique, or progression of COPD or comorbid conditions.  CAT MCID is 2 points  mMRC: mMRC (Modified Medical Research Council) Dyspnea Scale is used to assess the degree of baseline functional disability in patients of respiratory disease due to dyspnea. No minimal important difference is established. A decrease in score of 1 point or greater is considered a positive change.   Pulmonary Function Assessment:  Pulmonary Function Assessment - 04/24/20 0959       Breath   Bilateral Breath Sounds Clear;Decreased    Shortness of Breath Yes;Limiting activity             Exercise Target Goals: Exercise Program Goal: Individual exercise prescription set using results from initial 6 min walk test and THRR while considering  patient's activity barriers and safety.   Exercise Prescription Goal: Initial exercise prescription builds to 30-45 minutes a day of aerobic activity, 2-3 days per week.  Home exercise guidelines will be given to patient during program as part of exercise prescription  that the participant will acknowledge.  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 Pt used wheelchair for assistance and was still very unsteady     Resting HR 73 bpm     Resting BP 144/90     Resting Oxygen Saturation  92 %     Exercise Oxygen Saturation  during 6 min walk 86 %     Max Ex. HR 97 bpm     Max Ex. BP 170/100     2 Minute Post BP 175/106           Interval HR     1 Minute HR 97     2 Minute HR 94     3 Minute HR 89     4 Minute HR 85     5 Minute HR 76     6 Minute HR 93     2 Minute Post HR 79     Interval Heart Rate? Yes           Interval Oxygen     Interval Oxygen? Yes     Baseline Oxygen Saturation % 92 %     1 Minute Oxygen Saturation % 86 %     1 Minute Liters of Oxygen 1 L  Increased to 2L     2 Minute Oxygen Saturation % 88 %     2 Minute Liters of Oxygen 2 L     3 Minute Oxygen Saturation % 92 %     3 Minute Liters of Oxygen 2 L     4 Minute Oxygen Saturation % 94 %     4 Minute Liters of Oxygen 2 L     5 Minute Oxygen Saturation % 96 %     5 Minute Liters of Oxygen 2 L     6 Minute Oxygen Saturation % 91 %     6 Minute Liters of Oxygen 2 L     2 Minute Post Oxygen Saturation % 93 %     2 Minute Post Liters of Oxygen 2 L             Oxygen Initial Assessment:  Oxygen Initial Assessment - 04/24/20 0959       Home Oxygen   Home Oxygen Device None    Sleep Oxygen  Prescription None    Home Exercise Oxygen Prescription None    Home Resting Oxygen Prescription None    Compliance with Home Oxygen Use --   Pt's oxygen got taken from him 2 weeks ago due to the [place] stating he does not need it anymore     Initial 6 min Walk   Oxygen Used Continuous    Liters per minute 2  Program Oxygen Prescription   Program Oxygen Prescription Continuous    Liters per minute 2    Comments Pt sats dropped to 88% on RA just from standing up from chair. Pt needs to be on supplemental oxygen with exertion and maybe even at rest.      Intervention   Short Term Goals To learn and exhibit compliance with exercise, home and travel O2 prescription;To learn and understand importance of monitoring SPO2 with pulse oximeter and demonstrate accurate use of the pulse oximeter.;To learn and understand importance of maintaining oxygen saturations>88%;To learn and demonstrate proper pursed lip breathing techniques or other breathing techniques. ;To learn and demonstrate proper use of respiratory medications    Long  Term Goals Exhibits compliance with exercise, home  and travel O2 prescription;Verbalizes importance of monitoring SPO2 with pulse oximeter and return demonstration;Maintenance of O2 saturations>88%;Exhibits proper breathing techniques, such as pursed lip breathing or other method taught during program session;Compliance with respiratory medication;Demonstrates proper use of MDI's             Oxygen Re-Evaluation:  Oxygen Re-Evaluation     Row Name 05/01/20 0901 05/25/20 0744 06/26/20 0948         Program Oxygen Prescription   Program Oxygen Prescription Continuous Continuous Continuous     Liters per minute $RemoveBe'2 2 2     'eMmpxYkfy$ Comments Pt sats dropped to 88% on RA just from standing up from chair. Pt needs to be on supplemental oxygen with exertion and maybe even at rest. -- Pt still has not recieved home O2 through [place].           Home Oxygen       Home Oxygen Device None  None None     Sleep Oxygen Prescription None None None     Home Exercise Oxygen Prescription None None None     Home Resting Oxygen Prescription None None None           Goals/Expected Outcomes       Short Term Goals To learn and exhibit compliance with exercise, home and travel O2 prescription;To learn and understand importance of monitoring SPO2 with pulse oximeter and demonstrate accurate use of the pulse oximeter.;To learn and understand importance of maintaining oxygen saturations>88%;To learn and demonstrate proper pursed lip breathing techniques or other breathing techniques. ;To learn and demonstrate proper use of respiratory medications To learn and exhibit compliance with exercise, home and travel O2 prescription;To learn and understand importance of monitoring SPO2 with pulse oximeter and demonstrate accurate use of the pulse oximeter.;To learn and understand importance of maintaining oxygen saturations>88%;To learn and demonstrate proper pursed lip breathing techniques or other breathing techniques. ;To learn and demonstrate proper use of respiratory medications To learn and exhibit compliance with exercise, home and travel O2 prescription;To learn and understand importance of monitoring SPO2 with pulse oximeter and demonstrate accurate use of the pulse oximeter.;To learn and understand importance of maintaining oxygen saturations>88%;To learn and demonstrate proper pursed lip breathing techniques or other breathing techniques. ;To learn and demonstrate proper use of respiratory medications     Long  Term Goals Exhibits compliance with exercise, home  and travel O2 prescription;Verbalizes importance of monitoring SPO2 with pulse oximeter and return demonstration;Maintenance of O2 saturations>88%;Exhibits proper breathing techniques, such as pursed lip breathing or other method taught during program session;Compliance with respiratory medication;Demonstrates proper use of MDI's Exhibits compliance  with exercise, home  and travel O2 prescription;Verbalizes importance of monitoring SPO2 with pulse oximeter and return demonstration;Maintenance of O2  saturations>88%;Exhibits proper breathing techniques, such as pursed lip breathing or other method taught during program session;Compliance with respiratory medication;Demonstrates proper use of MDI's Exhibits compliance with exercise, home  and travel O2 prescription;Verbalizes importance of monitoring SPO2 with pulse oximeter and return demonstration;Maintenance of O2 saturations>88%;Exhibits proper breathing techniques, such as pursed lip breathing or other method taught during program session;Compliance with respiratory medication;Demonstrates proper use of MDI's     Comments We are working on getting pt access to supplemental oxygen for home needs and will continue to monitor his oxygen needs while at rehab. Still trying to get pt set up with home oxygen. [facility]. According to our data he needs to be on supplemental oxygen with exertion and maybe even at rest. Still trying to get pt set up with home oxygen. [facility]. According to our data he needs to be on supplemental oxygen with exertion and maybe even at rest.     Goals/Expected Outcomes Compliance and understanding of oxygen saturation and pursed lip breathing. Compliance and understanding of oxygen saturation and pursed lip breathing. Compliance and understanding of oxygen saturation and pursed lip breathing.             Oxygen Discharge (Final Oxygen Re-Evaluation):  Oxygen Re-Evaluation - 06/26/20 0948       Program Oxygen Prescription   Program Oxygen Prescription Continuous    Liters per minute 2    Comments Pt still has not recieved home O2 through [place].      Home Oxygen   Home Oxygen Device None    Sleep Oxygen Prescription None    Home Exercise Oxygen Prescription None    Home Resting  Oxygen Prescription None      Goals/Expected Outcomes   Short Term Goals To learn and exhibit compliance with exercise, home and travel O2 prescription;To learn and understand importance of monitoring SPO2 with pulse oximeter and demonstrate accurate use of the pulse oximeter.;To learn and understand importance of maintaining oxygen saturations>88%;To learn and demonstrate proper pursed lip breathing techniques or other breathing techniques. ;To learn and demonstrate proper use of respiratory medications    Long  Term Goals Exhibits compliance with exercise, home  and travel O2 prescription;Verbalizes importance of monitoring SPO2 with pulse oximeter and return demonstration;Maintenance of O2 saturations>88%;Exhibits proper breathing techniques, such as pursed lip breathing or other method taught during program session;Compliance with respiratory medication;Demonstrates proper use of MDI's    Comments Still trying to get pt set up with home oxygen. [facility]. According to our data he needs to be on supplemental oxygen with exertion and maybe even at rest.    Goals/Expected Outcomes Compliance and understanding of oxygen saturation and pursed lip breathing.             Initial Exercise Prescription:  Initial Exercise Prescription - 04/24/20 1200       Date of Initial Exercise RX and Referring Provider   Date 04/24/20    Referring Provider Dr. Radford Pax ([facility])    Expected Discharge Date 06/29/20      Oxygen   Oxygen Continuous    Liters 2      NuStep   Level 1    SPM 75    Minutes 30      Prescription Details   Frequency (times per week) 2    Duration Progress to 30 minutes of  continuous aerobic without signs/symptoms of physical distress      Intensity   THRR 40-80% of Max Heartrate 58-117    Ratings of Perceived Exertion 11-13    Perceived Dyspnea 0-4      Progression   Progression Continue to progress workloads to maintain intensity  without signs/symptoms of physical distress.      Resistance Training   Training Prescription Yes    Weight Red bands    Reps 10-15             Perform Capillary Blood Glucose checks as needed.  Exercise Prescription Changes:   Exercise Prescription Changes     Row Name 05/09/20 1300 05/23/20 1200 06/06/20 1200 06/20/20 1100       Response to Exercise   Blood Pressure (Admit) 148/84 130/76 148/82 158/84    Blood Pressure (Exercise) 140/84 150/86 140/70 138/88    Blood Pressure (Exit) 140/88 118/64 124/70 144/84    Heart Rate (Admit) 83 bpm 86 bpm 76 bpm 80 bpm    Heart Rate (Exercise) 94 bpm 84 bpm 74 bpm 88 bpm    Heart Rate (Exit) 97 bpm 77 bpm 76 bpm 72 bpm    Oxygen Saturation (Admit) 93 % 91 % 93 % 96 %    Oxygen Saturation (Exercise) 94 % 93 % 92 % 94 %    Oxygen Saturation (Exit) 97 % 92 % 96 % 96 %    Rating of Perceived Exertion (Exercise) 13 13 13 13     Perceived Dyspnea (Exercise) 2 3 3 3     Duration Progress to 30 minutes of  aerobic without signs/symptoms of physical distress Continue with 30 min of aerobic exercise without signs/symptoms of physical distress. Continue with 30 min of aerobic exercise without signs/symptoms of physical distress. Continue with 30 min of aerobic exercise without signs/symptoms of physical distress.    Intensity --  40-80% HRR THRR unchanged THRR unchanged THRR unchanged         Progression        Progression -- -- Continue to progress workloads to maintain intensity without signs/symptoms of physical distress. Continue to progress workloads to maintain intensity without signs/symptoms of physical distress.         Resistance Training        Training Prescription Yes Yes Yes Yes    Weight Red bands red bands red bands red bands    Reps 10-15 10-15 10-15 10-15    Time 10 Minutes 10 Minutes 10 Minutes 10 Minutes         Oxygen        Oxygen Continuous Continuous Continuous Continuous    Liters 2 2 2 2          NuStep         Level 2 -- 2 2    SPM 80 80 80 80    Minutes 30 30 30 30     METs 1.9 1.4 1.6 1.6         Home Exercise Plan        Plans to continue exercise at -- -- Home (comment) --    Frequency -- -- Add 2 additional days to program exercise sessions. --    Initial Home Exercises Provided -- -- 06/06/20 --            Exercise Comments:   Exercise Comments     Row Name 05/02/20 1207 06/06/20 1226         Exercise Comments Pt completed first day  of exercise and tolerated well for the most part. He was able to do the 30 minutes on the Nustep with no concerns. He was a little unsteady while doing the resistance bands standing and had to sit down several times to rest. He remained on 2L of continuous oxygen throughout the whole exercise session. 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 Will continue to monitor. Completed home exercise with pt. Pt currently does the band exercises at home. He is limited to activity because the [place] has not scheduled him for a walk test to get him the supplemental oxygen that he needs. For now, I have only encouraged continued resistance band exercises until he receives supplemental oxygen.               Exercise Goals and Review:   Exercise Goals     Row Name 04/24/20 1212             Exercise Goals   Increase Physical Activity Yes       Intervention Provide advice, education, support and counseling about physical activity/exercise needs.;Develop an individualized exercise prescription for aerobic and resistive training based on initial evaluation findings, risk stratification, comorbidities and participant's personal goals.       Expected Outcomes Short Term: Attend rehab on a regular basis to increase amount of physical activity.;Long Term: Add in home exercise to make exercise part of routine and to increase amount of physical activity.;Long Term: Exercising  regularly at least 3-5 days a week.       Increase Strength and Stamina Yes       Intervention Provide advice, education, support and counseling about physical activity/exercise needs.;Develop an individualized exercise prescription for aerobic and resistive training based on initial evaluation findings, risk stratification, comorbidities and participant's personal goals.       Expected Outcomes Short Term: Increase workloads from initial exercise prescription for resistance, speed, and METs.;Short Term: Perform resistance training exercises routinely during rehab and add in resistance training at home;Long Term: Improve cardiorespiratory fitness, muscular endurance and strength as measured by increased METs and functional capacity ( )       Able to understand and use rate of perceived exertion (RPE) scale Yes       Intervention Provide education and explanation on how to use RPE scale       Expected Outcomes Short Term: Able to use RPE daily in rehab to express subjective intensity level;Long Term:  Able to use RPE to guide intensity level when exercising independently       Able to understand and use Dyspnea scale Yes       Intervention Provide education and explanation on how to use Dyspnea scale       Expected Outcomes Short Term: Able to use Dyspnea scale daily in rehab to express subjective sense of shortness of breath during exertion;Long Term: Able to use Dyspnea scale to guide intensity level when exercising independently       Knowledge and understanding of Target Heart Rate Range (THRR) Yes       Intervention Provide education and explanation of THRR including how the numbers were predicted and where they are located for reference       Expected Outcomes Short Term: Able to state/look up THRR;Long Term: Able to use THRR to govern intensity when exercising independently;Short Term: Able to use daily as guideline for intensity in rehab  Understanding of Exercise Prescription Yes        Intervention Provide education, explanation, and written materials on patient's individual exercise prescription       Expected Outcomes Short Term: Able to explain program exercise prescription;Long Term: Able to explain home exercise prescription to exercise independently                Exercise Goals Re-Evaluation :  Exercise Goals Re-Evaluation     Row Name 05/01/20 0900 05/25/20 0742 06/26/20 0945         Exercise Goal Re-Evaluation   Exercise Goals Review Increase Physical Activity;Increase Strength and Stamina;Able to understand and use rate of perceived exertion (RPE) scale;Able to understand and use Dyspnea scale;Knowledge and understanding of Target Heart Rate Range (THRR);Understanding of Exercise Prescription Increase Physical Activity;Increase Strength and Stamina;Able to understand and use rate of perceived exertion (RPE) scale;Able to understand and use Dyspnea scale;Knowledge and understanding of Target Heart Rate Range (THRR);Understanding of Exercise Prescription Increase Physical Activity;Increase Strength and Stamina;Able to understand and use rate of perceived exertion (RPE) scale;Able to understand and use Dyspnea scale;Knowledge and understanding of Target Heart Rate Range (THRR);Understanding of Exercise Prescription     Comments Pt is scheduled to start exercise this week. We will monitor and make changes as needed. Firas has completed 7 exercise sessions and has tolerated well so far. He is very deconditioned but is able to do 30 minutes on the Nustep with resting. He is also able to do the warm up and resistance exercises sitting in a chair. He is exercising at 1.4 METS on the Nustep. Will continue to monitor and progress as he is able. [place] has completed 16 exercise sessions and has been slow to make much progress due to severe deconditioning and SOB. He still has not recieved home supplemental O2 so he is unable to do exercise at home. He is currently exercising at 1.6  METS on the Nustep. He is scheduled to graduate from the program this week. Will continue to monitor until then.     Expected Outcomes Through exercise at rehab and home, the patient will decrease shortness of breath with daily activities and feel confident in carrying out an exercise regimn at home. Through exercise at rehab and home, the patient will decrease shortness of breath with daily activities and feel confident in carrying out an exercise regimn at home. Through exercise at rehab and home, the patient will decrease shortness of breath with daily activities and feel confident in carrying out an exercise regimn at home.              Discharge Exercise Prescription (Final Exercise Prescription Changes):  Exercise Prescription Changes - 06/20/20 1100       Response to Exercise   Blood Pressure (Admit) 158/84    Blood Pressure (Exercise) 138/88    Blood Pressure (Exit) 144/84    Heart Rate (Admit) 80 bpm    Heart Rate (Exercise) 88 bpm    Heart Rate (Exit) 72 bpm    Oxygen Saturation (Admit) 96 %    Oxygen Saturation (Exercise) 94 %    Oxygen Saturation (Exit) 96 %    Rating of Perceived Exertion (Exercise) 13    Perceived Dyspnea (Exercise) 3    Duration Continue with 30 min of aerobic exercise without signs/symptoms of physical distress.    Intensity THRR unchanged      Progression   Progression Continue to progress workloads to maintain intensity without signs/symptoms of physical distress.  Resistance Training   Training Prescription Yes    Weight red bands    Reps 10-15    Time 10 Minutes      Oxygen   Oxygen Continuous    Liters 2      NuStep   Level 2    SPM 80    Minutes 30    METs 1.6             Nutrition:  Target Goals: Understanding of nutrition guidelines, daily intake of sodium '1500mg'$ , cholesterol '200mg'$ , calories 30% from fat and 7% or less from saturated fats, daily to have 5 or more servings of fruits and vegetables.  Biometrics:   Pre Biometrics - 04/24/20 0914       Pre Biometrics   Height $Remov'6\' 2"'tkdTCg$  (1.88 m)    Weight 133.2 kg    BMI (Calculated) 37.69    Grip Strength 25 kg              Nutrition Therapy Plan and Nutrition Goals:  Nutrition Therapy & Goals - 05/08/20 1041       Personal Nutrition Goals   Nutrition Goal Pt to identify food quantities necessary to achieve weight loss of 6-24 lb at graduation from cardiac rehab.    Personal Goal #2 Pt to build a healthy plate including vegetables, fruits, whole grains, and low-fat dairy products in a heart healthy meal plan.    Personal Goal #3 Pt to reduce sugary beverages to <1 per day      Intervention Plan   Intervention Prescribe, educate and counsel regarding individualized specific dietary modifications aiming towards targeted core components such as weight, hypertension, lipid management, diabetes, heart failure and other comorbidities.;Nutrition handout(s) given to patient.    Expected Outcomes Short Term Goal: A plan has been developed with personal nutrition goals set during dietitian appointment.;Long Term Goal: Adherence to prescribed nutrition plan.             Nutrition Assessments:  MEDIFICTS Score Key: ?70 Need to make dietary changes  40-70 Heart Healthy Diet ? 40 Therapeutic Level Cholesterol Diet  Flowsheet Row PULMONARY REHAB OTHER RESPIRATORY from 05/04/2020 in [place]  Picture Your Plate Total Score on Admission 43      Picture Your Plate Scores: <15 Unhealthy dietary pattern with much room for improvement. 41-50 Dietary pattern unlikely to meet recommendations for good health and room for improvement. 51-60 More healthful dietary pattern, with some room for improvement.  >60 Healthy dietary pattern, although there may be some specific behaviors that could be improved.    Nutrition Goals Re-Evaluation:  Nutrition Goals Re-Evaluation     [place] Name 05/08/20 1042 05/29/20 1205 06/27/20  0645         Goals   Current Weight 293 lb (132.9 kg) 293 lb 14 oz (133.3 kg) 288 lb 12.8 oz (131 kg)     Nutrition Goal Pt to identify food quantities necessary to achieve weight loss of 6-24 lb at graduation from cardiac rehab. Pt to identify food quantities necessary to achieve weight loss of 6-24 lb at graduation from cardiac rehab. Pt to identify food quantities necessary to achieve weight loss of 6-24 lb at graduation from cardiac rehab.     Comment -- No weight loss. Pt has lost 5 lbs           Personal Goal #2 Re-Evaluation       Personal Goal #2 Pt to build a healthy plate including vegetables, fruits,  whole grains, and low-fat dairy products in a heart healthy meal plan. Pt to build a healthy plate including vegetables, fruits, whole grains, and low-fat dairy products in a heart healthy meal plan. Pt to build a healthy plate including vegetables, fruits, whole grains, and low-fat dairy products in a heart healthy meal plan.           Personal Goal #3 Re-Evaluation       Personal Goal #3 Pt to reduce sugary beverages to <1 per day Pt to reduce sugary beverages to <1 per day Pt to reduce sugary beverages to <1 per day             Nutrition Goals Discharge (Final Nutrition Goals Re-Evaluation):  Nutrition Goals Re-Evaluation - 06/27/20 0645       Goals   Current Weight 288 lb 12.8 oz (131 kg)    Nutrition Goal Pt to identify food quantities necessary to achieve weight loss of 6-24 lb at graduation from cardiac rehab.    Comment Pt has lost 5 lbs      Personal Goal #2 Re-Evaluation   Personal Goal #2 Pt to build a healthy plate including vegetables, fruits, whole grains, and low-fat dairy products in a heart healthy meal plan.      Personal Goal #3 Re-Evaluation   Personal Goal #3 Pt to reduce sugary beverages to <1 per day             Psychosocial: Target Goals: Acknowledge presence or absence of significant depression and/or stress, maximize coping skills, provide  positive support system. Participant is able to verbalize types and ability to use techniques and skills needed for reducing stress and depression.  Initial Review & Psychosocial Screening:  Initial Psych Review & Screening - 04/24/20 1012       Initial Review   Current issues with Current Depression;History of Depression;Current Anxiety/Panic;Current Stress Concerns    Source of Stress Concerns Chronic Illness;Unable to perform yard/household activities;Unable to participate in former interests or hobbies;Retirement/disability    Comments Disabled from exposure to agent orange      Family Dynamics   Good Support System? Yes   brother     Barriers   Psychosocial barriers to participate in program The patient should benefit from training in stress management and relaxation.      Screening Interventions   Interventions Encouraged to exercise    Expected Outcomes Short Term goal: Utilizing psychosocial counselor, staff and physician to assist with identification of specific Stressors or current issues interfering with healing process. Setting desired goal for each stressor or current issue identified.;Long Term Goal: Stressors or current issues are controlled or eliminated.             Quality of Life Scores:  Scores of 19 and below usually indicate a poorer quality of life in these areas.  A difference of  2-3 points is a clinically meaningful difference.  A difference of 2-3 points in the total score of the Quality of Life Index has been associated with significant improvement in overall quality of life, self-image, physical symptoms, and general health in studies assessing change in quality of life.  PHQ-9: Recent Review Flowsheet Data     Depression screen [facility] 2/9 04/24/2020   Decreased Interest 1   Down, Depressed, Hopeless 1   PHQ - 2 Score 2   Altered sleeping 0   Tired, decreased energy 3   Change in appetite 0   Feeling bad or failure about yourself  0  Trouble  concentrating 0   Moving slowly or fidgety/restless 0   Suicidal thoughts 0   PHQ-9 Score 5   Difficult doing work/chores Somewhat difficult      Interpretation of Total Score  Total Score Depression Severity:  1-4 = Minimal depression, 5-9 = Mild depression, 10-14 = Moderate depression, 15-19 = Moderately severe depression, 20-27 = Severe depression   Psychosocial Evaluation and Intervention:  Psychosocial Evaluation - 04/24/20 1014       Psychosocial Evaluation & Interventions   Interventions Encouraged to exercise with the program and follow exercise prescription;Stress management education;Relaxation education    Comments Sees a therapist every 3 months, is on lexapro    Expected Outcomes Ability to handle psychosocial issues in healthy ways    Continue Psychosocial Services  Follow up required by staff             Psychosocial Re-Evaluation:  Psychosocial Re-Evaluation     [place] Name 04/24/20 1015 05/22/20 1215 06/26/20 1328         Psychosocial Re-Evaluation   Current issues with Current Depression;History of Depression;Current Stress Concerns;Current Anxiety/Panic Current Depression;History of Depression;Current Stress Concerns;Current Anxiety/Panic Current Depression;History of Depression;Current Anxiety/Panic     Comments -- Depression is stable at this point, he is being treated for depression through the [place] with medications and therapy. Makye's depression and anxiety are stable at this time.  He talks with a Social worker and takes medication through the [place].     Expected Outcomes -- For Clever to continue to handle his depression and stress in healthy ways. For Jarred to continue to have no psychosocial concerns while participating in pulmonary rehab.     Interventions Relaxation education;Stress management education;Encouraged to attend Pulmonary Rehabilitation for the exercise Stress management education;Relaxation education;Encouraged to attend Pulmonary Rehabilitation for  the exercise Encouraged to attend Pulmonary Rehabilitation for the exercise     Continue Psychosocial Services  Follow up required by staff Follow up required by staff Follow up required by staff           Initial Review       Source of Stress Concerns Chronic Illness;Unable to perform yard/household activities;Unable to participate in former interests or hobbies;Retirement/disability Chronic Illness;Retirement/disability;Unable to participate in former interests or hobbies;Unable to perform yard/household activities;Financial Chronic Illness;Unable to participate in former interests or hobbies;Unable to perform yard/household activities;Retirement/disability             Psychosocial Discharge (Final Psychosocial Re-Evaluation):  Psychosocial Re-Evaluation - 06/26/20 1328       Psychosocial Re-Evaluation   Current issues with Current Depression;History of Depression;Current Anxiety/Panic    Comments Aidyn's depression and anxiety are stable at this time.  He talks with a Social worker and takes medication through the [place].    Expected Outcomes For Danell to continue to have no psychosocial concerns while participating in pulmonary rehab.    Interventions Encouraged to attend Pulmonary Rehabilitation for the exercise    Continue Psychosocial Services  Follow up required by staff      Initial Review   Source of Stress Concerns Chronic Illness;Unable to participate in former interests or hobbies;Unable to perform yard/household activities;Retirement/disability             Education: Education Goals: Education classes will be provided on a weekly basis, covering required topics. Participant will state understanding/return demonstration of topics presented.  Learning Barriers/Preferences:  Learning Barriers/Preferences - 04/24/20 1015       Learning Barriers/Preferences   Learning Barriers None    Learning Preferences  Written Material;Verbal Instruction;Video;Skilled  Demonstration;Pictoral;Individual Instruction;Group Instruction;Audio             Education Topics: Risk Factor Reduction:  -Group instruction that is supported by a PowerPoint presentation. Instructor discusses the definition of a risk factor, different risk factors for pulmonary disease, and how the heart and lungs work together.     Nutrition for Pulmonary Patient:  -Group instruction provided by PowerPoint slides, verbal discussion, and written materials to support subject matter. The instructor gives an explanation and review of healthy diet recommendations, which includes a discussion on weight management, recommendations for fruit and vegetable consumption, as well as protein, fluid, caffeine, fiber, sodium, sugar, and alcohol. Tips for eating when patients are short of breath are discussed. Flowsheet Row PULMONARY REHAB OTHER RESPIRATORY from 06/22/2020 in [place]  Date 06/22/20  Educator Handout       Pursed Lip Breathing:  -Group instruction that is supported by demonstration and informational handouts. Instructor discusses the benefits of pursed lip and diaphragmatic breathing and detailed demonstration on how to preform both.     Oxygen Safety:  -Group instruction provided by PowerPoint, verbal discussion, and written material to support subject matter. There is an overview of "What is Oxygen" and "Why do we need it".  Instructor also reviews how to create a safe environment for oxygen use, the importance of using oxygen as prescribed, and the risks of noncompliance. There is a brief discussion on traveling with oxygen and resources the patient may utilize. Flowsheet Row PULMONARY REHAB OTHER RESPIRATORY from 06/22/2020 in [place]  Date 06/15/20  Educator Handout       Oxygen Equipment:  -Group instruction provided by [facility] Staff utilizing handouts, written materials, and equipment  demonstrations.   Signs and Symptoms:  -Group instruction provided by written material and verbal discussion to support subject matter. Warning signs and symptoms of infection, stroke, and heart attack are reviewed and when to call the physician/911 reinforced. Tips for preventing the spread of infection discussed.   Advanced Directives:  -Group instruction provided by verbal instruction and written material to support subject matter. Instructor reviews Advanced Directive laws and proper instruction for filling out document.   Pulmonary Video:  -Group video education that reviews the importance of medication and oxygen compliance, exercise, good nutrition, pulmonary hygiene, and pursed lip and diaphragmatic breathing for the pulmonary patient.   Exercise for the Pulmonary Patient:  -Group instruction that is supported by a PowerPoint presentation. Instructor discusses benefits of exercise, core components of exercise, frequency, duration, and intensity of an exercise routine, importance of utilizing pulse oximetry during exercise, safety while exercising, and options of places to exercise outside of rehab.   Flowsheet Row PULMONARY REHAB OTHER RESPIRATORY from 06/22/2020 in [place]  Date 05/11/20  Educator handout       Pulmonary Medications:  -Verbally interactive group education provided by instructor with focus on inhaled medications and proper administration.   Anatomy and Physiology of the Respiratory System and Intimacy:  -Group instruction provided by PowerPoint, verbal discussion, and written material to support subject matter. Instructor reviews respiratory cycle and anatomical components of the respiratory system and their functions. Instructor also reviews differences in obstructive and restrictive respiratory diseases with examples of each. Intimacy, Sex, and Sexuality differences are reviewed with a discussion on how relationships can change  when diagnosed with pulmonary disease. Common sexual concerns are reviewed. Flowsheet Row PULMONARY REHAB OTHER RESPIRATORY from  06/22/2020 in [place]  Date 06/08/20  Educator H/0 -Keane Scrape  Instruction Review Code 1- Verbalizes Understanding       MD DAY -A group question and answer session with a medical doctor that allows participants to ask questions that relate to their pulmonary disease state.   OTHER EDUCATION -Group or individual verbal, written, or video instructions that support the educational goals of the pulmonary rehab program. [place] from 06/22/2020 in [place]  Date 05/25/20  [place] Reading]  Educator handout       Holiday Eating Survival Tips:  -Group instruction provided by PowerPoint slides, verbal discussion, and written materials to support subject matter. The instructor gives patients tips, tricks, and techniques to help them not only survive but enjoy the holidays despite the onslaught of food that accompanies the holidays.   Knowledge Questionnaire Score:  Knowledge Questionnaire Score - 04/24/20 1227       Knowledge Questionnaire Score   Pre Score 14/18             Core Components/Risk Factors/Patient Goals at Admission:  Personal Goals and Risk Factors at Admission - 04/24/20 1015       Core Components/Risk Factors/Patient Goals on Admission   Improve shortness of breath with ADL's Yes    Intervention Provide education, individualized exercise plan and daily activity instruction to help decrease symptoms of SOB with activities of daily living.    Expected Outcomes Short Term: Improve cardiorespiratory fitness to achieve a reduction of symptoms when performing ADLs;Long Term: Be able to perform more ADLs without symptoms or delay the onset of symptoms             Core Components/Risk Factors/Patient Goals Review:   Goals and Risk  Factor Review     Row Name 04/24/20 1016 05/22/20 1217 06/26/20 1330         Core Components/Risk Factors/Patient Goals Review   Personal Goals Review Develop more efficient breathing techniques such as purse lipped breathing and diaphragmatic breathing and practicing self-pacing with activity.;Increase knowledge of respiratory medications and ability to use respiratory devices properly.;Improve shortness of breath with ADL's Develop more efficient breathing techniques such as purse lipped breathing and diaphragmatic breathing and practicing self-pacing with activity.;Increase knowledge of respiratory medications and ability to use respiratory devices properly.;Improve shortness of breath with ADL's Develop more efficient breathing techniques such as purse lipped breathing and diaphragmatic breathing and practicing self-pacing with activity.;Increase knowledge of respiratory medications and ability to use respiratory devices properly.;Improve shortness of breath with ADL's     Review -- Netty Starring is 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  He requires 2L of supplemental oxygen to exercise in pulmonary rehab.  [facility] to [facility] him for oxgen at home. He has been taught purse lip breathing to use when exercising . 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  His attendance is great and he works hard when he is here, but he has been difficult to increase workloads d/t his shortness of breath and fatigue.     Expected Outcomes -- See admission goals. See admission goals.              Core Components/Risk Factors/Patient Goals at Discharge (Final Review):   Goals and Risk Factor Review - 06/26/20 1330       Core Components/Risk Factors/Patient Goals  Review   Personal Goals Review Develop more efficient breathing techniques such as purse lipped breathing and diaphragmatic breathing and practicing self-pacing with  activity.;Increase knowledge of respiratory medications and ability to use respiratory devices properly.;Improve shortness of breath with ADL's    Review Jaxxon has severe COPD and has been very slow to progress if at all.  His attendance is great and he works hard when he is here, but he has been difficult to increase workloads d/t his shortness of breath and fatigue.    Expected Outcomes See admission goals.             ITP Comments:   Comments: ITP REVIEW Pt is making expected progress toward pulmonary rehab goals after completing 16 sessions. Recommend continued exercise, life style modification, education, and utilization of breathing techniques to increase stamina and strength and decrease shortness of breath with exertion.

## 2020-06-29 ENCOUNTER — Other Ambulatory Visit: Payer: Self-pay

## 2020-06-29 ENCOUNTER — Encounter (HOSPITAL_COMMUNITY): Payer: No Typology Code available for payment source

## 2020-06-29 ENCOUNTER — Encounter (HOSPITAL_COMMUNITY)
Admission: RE | Admit: 2020-06-29 | Discharge: 2020-06-29 | Disposition: A | Discharge status: Home / Self Care | Payer: No Typology Code available for payment source | Source: Ambulatory Visit | Attending: Cardiology | Admitting: Cardiology

## 2020-06-29 DIAGNOSIS — R0600 Dyspnea, unspecified: Secondary | ICD-10-CM | POA: Diagnosis not present

## 2020-06-29 DIAGNOSIS — R0609 Other forms of dyspnea: Secondary | ICD-10-CM

## 2020-06-29 NOTE — Progress Notes (Signed)
Daily Session Note  Patient Details  Name: Jeremy Landry MRN: [id number] Date of Birth: 1945-11-04 Referring Provider:   [name] Pulmonary Rehab Walk Test from 04/24/2020 in [place]  Referring Provider Dr. Radford Pax ([place])       Encounter Date: 06/29/2020  Check In:  Session Check In - 06/29/20 1150       Check-In   Supervising physician immediately available to respond to emergencies [facility] immediately available    Physician(s) Dr. Lonny Prude    Location [facility]    Staff Present Rosebud Poles, RN, BSN;Lisa Ysidro Evert, RN;Cleo Santucci Hassell Done, MS, ACSM-CEP, Exercise Physiologist;Carlette Wilber Oliphant, RN, BSN;Ramon Dredge, RN, MHA    Virtual Visit No    Medication changes reported     No    Fall or balance concerns reported    No    Tobacco Cessation No Change    Warm-up and Cool-down Performed as group-led instruction    Resistance Training Performed Yes    VAD Patient? No    PAD/SET Patient? No      Pain Assessment   Currently in Pain? No/denies    Multiple Pain Sites No             Capillary Blood Glucose: No results found for this or any previous visit (from the past 24 hour(s)).    Social History   Tobacco Use  Smoking Status Former   Packs/day: 1.00   Years: 40.00   Pack years: 40.00   Types: Cigarettes   Quit date: 07/23/2014   Years since quitting: 5.9  Smokeless Tobacco Never    Goals Met:  Proper associated with RPD/PD & O2 Sat Using PLB without cueing & demonstrates good technique Exercise tolerated well No report of cardiac concerns or symptoms Patient completed pulmonary rehab program  Goals Unmet:  Not Applicable  Comments: Service time is from 1015 to 1100 Patient completed pulmonary rehab program today and made improvement with post 6 minute walk test.     Dr. Fransico Him is Medical Director for Cardiac Rehab at [facility].

## 2020-08-02 NOTE — Addendum Note (Signed)
Encounter addended by: George Ina, RD on: 08/02/2020 10:46 AM  Actions taken: Flowsheet data copied forward, Flowsheet accepted

## 2020-08-03 ENCOUNTER — Ambulatory Visit (INDEPENDENT_AMBULATORY_CARE_PROVIDER_SITE_OTHER): Payer: No Typology Code available for payment source | Admitting: Plastic Surgery

## 2020-08-03 ENCOUNTER — Other Ambulatory Visit: Payer: Self-pay

## 2020-08-03 DIAGNOSIS — L910 Hypertrophic scar: Secondary | ICD-10-CM | POA: Diagnosis not present

## 2020-08-03 NOTE — Progress Notes (Signed)
   Referring Provider Sonia Side., FNP [place],  [place] [id number]   CC:  Chief Complaint  Patient presents with   Follow-up      Jeremy Landry is an 75 y.o. male.  HPI: Patient presents to discuss another steroid injection for keloid on his right neck.  This was a recurrence after an excision.  It still much smaller than it was before but he wants it to be a little bit flatter if possible.  I have injected this before and it got a little bit better but he wants to see if we can do even better with another injection.  Review of Systems General: Denies fevers and chills  Physical Exam Vitals with BMI 06/20/2020 06/06/2020 05/23/2020  Height - - -  Weight 288 lbs 13 oz 292 lbs 5 oz 293 lbs 14 oz  BMI - - 80.88  Systolic - - -  Diastolic - - -  Pulse - - -    General:  No acute distress,  Alert and oriented, Non-Toxic, Normal speech and affect Examination shows a 3 to 4 cm longitudinal raised scar along his excision line.  It looks to be a bit flatter than it was last time.  Assessment/Plan Patient presents with a small keloid recurrence.  We discussed the pros and cons of steroid injection he is interested in moving forward.  The risks were reviewed.  The area was prepped with an alcohol pad and 1 cc of Kenalog 40 was mixed with 1 cc lidocaine with epinephrine and this was just gently injected along the lesion.  He tolerated this fine.  We will plan to see him again in 2 months to see if he needs another injection.  All of his questions were answered.  Cindra Presume 08/03/2020, 11:55 AM

## 2020-08-07 NOTE — Addendum Note (Signed)
Encounter addended by: Lance Morin, RN on: 08/07/2020 2:02 PM  Actions taken: Clinical Note Signed, Episode resolved

## 2020-08-07 NOTE — Progress Notes (Signed)
Discharge Progress Report  Patient Details  Name: Jeremy Landry MRN: [id number] Date of Birth: 13-Jul-1945 Referring Provider:   [name] Walk Test from 04/24/2020 in [place]  Referring Provider Dr. Radford Pax ([place])        Number of Visits: 18  Reason for Discharge:  Patient reached a stable level of exercise. Patient independent in their exercise. Patient has met program and personal goals.  Smoking History:  Social History   Tobacco Use  Smoking Status Former   Packs/day: 1.00   Years: 40.00   Pack years: 40.00   Types: Cigarettes   Quit date: 07/23/2014   Years since quitting: 6.0  Smokeless Tobacco Never    Diagnosis:  Dyspnea on exertion  ADL [facility]SD:  Pulmonary Assessment Scores     Row Name 04/24/20 1215 04/24/20 1226 06/29/20 1209     ADL UCSD   ADL Phase Entry Entry Exit   SOB Score total -- 84 92     CAT Score   CAT Score -- 32 33     mMRC Score   mMRC Score 4 -- 4            Initial Exercise Prescription:  Initial Exercise Prescription - 04/24/20 1200       Date of Initial Exercise RX and Referring Provider   Date 04/24/20    Referring Provider Dr. Radford Pax ([place])    Expected Discharge Date 06/29/20      Oxygen   Oxygen Continuous    Liters 2      NuStep   Level 1    SPM 75    Minutes 30      Prescription Details   Frequency (times per week) 2    Duration Progress to 30 minutes of continuous aerobic without signs/symptoms of physical distress      Intensity   THRR 40-80% of Max Heartrate 58-117    Ratings of Perceived Exertion 11-13    Perceived Dyspnea 0-4      Progression   Progression Continue to progress workloads to maintain intensity without signs/symptoms of physical distress.      Resistance Training   Training Prescription Yes    Weight Red bands    Reps 10-15             Discharge Exercise Prescription (Final Exercise Prescription Changes):  Exercise  Prescription Changes - 06/20/20 1100       Response to Exercise   Blood Pressure (Admit) 158/84    Blood Pressure (Exercise) 138/88    Blood Pressure (Exit) 144/84    Heart Rate (Admit) 80 bpm    Heart Rate (Exercise) 88 bpm    Heart Rate (Exit) 72 bpm    Oxygen Saturation (Admit) 96 %    Oxygen Saturation (Exercise) 94 %    Oxygen Saturation (Exit) 96 %    Rating of Perceived Exertion (Exercise) 13    Perceived Dyspnea (Exercise) 3    Duration Continue with 30 min of aerobic exercise without signs/symptoms of physical distress.    Intensity THRR unchanged      Progression   Progression Continue to progress workloads to maintain intensity without signs/symptoms of physical distress.      Resistance Training   Training Prescription Yes    Weight red bands    Reps 10-15    Time 10 Minutes      Oxygen   Oxygen Continuous    Liters 2  NuStep   Level 2    SPM 80    Minutes 30    METs 1.6             Functional Capacity:  6 Minute Walk     Row Name 04/24/20 1216 06/29/20 1204       6 Minute Walk   Phase Initial Discharge    Distance 413 feet 600 feet    Distance % Change -- 45.28 %    Distance Feet Change -- 187 ft    Walk Time 6 minutes 6 minutes    # of Rest Breaks 1  1 seated break for 1 minute 0    MPH 0.78 1.14    METS 0.81 1.09    RPE 13 15    Perceived Dyspnea  3 3.5    VO2 Peak 2.84 3.82    Symptoms Yes (comment) Yes (comment)    Comments SOB and fatigue. Pt used wheelchair for assistance and was still very unsteady Chronic back pain rated 10/10 at the end of walk test. Used a walker for assistance    Resting HR 73 bpm 74 bpm    Resting BP 144/90 134/82    Resting Oxygen Saturation  92 % 98 %    Exercise Oxygen Saturation  during 6 min walk 86 % 77 %    Max Ex. HR 97 bpm 100 bpm    Max Ex. BP 170/100 160/92    2 Minute Post BP 175/106 144/90         Interval HR      1 Minute HR 97 94    2 Minute HR 94 93    3 Minute HR 89 97    4  Minute HR 85 97    5 Minute HR 76 100    6 Minute HR 93 100    2 Minute Post HR 79 77    Interval Heart Rate? Yes --         Interval Oxygen      Interval Oxygen? Yes --    Baseline Oxygen Saturation % 92 % 98 %    1 Minute Oxygen Saturation % 86 % 77 %    1 Minute Liters of Oxygen 1 L  Increased to 2L 2 L  Increased to 3L continuous    2 Minute Oxygen Saturation % 88 % 95 %    2 Minute Liters of Oxygen 2 L 3 L    3 Minute Oxygen Saturation % 92 % 93 %    3 Minute Liters of Oxygen 2 L 3 L    4 Minute Oxygen Saturation % 94 % 94 %    4 Minute Liters of Oxygen 2 L 3 L    5 Minute Oxygen Saturation % 96 % 93 %    5 Minute Liters of Oxygen 2 L 3 L    6 Minute Oxygen Saturation % 91 % 94 %    6 Minute Liters of Oxygen 2 L 3 L    2 Minute Post Oxygen Saturation % 93 % 98 %    2 Minute Post Liters of Oxygen 2 L 3 L            Psychological, QOL, Others - Outcomes: PHQ 2/9: Depression screen [facility] 2/9 06/29/2020 04/24/2020  Decreased Interest 0 1  Down, Depressed, Hopeless 0 1  PHQ - 2 Score 0 2  Altered sleeping 0 0  Tired, decreased energy 3 3  Change in appetite  0 0  Feeling bad or failure about yourself  0 0  Trouble concentrating 0 0  Moving slowly or fidgety/restless 0 0  Suicidal thoughts 0 0  PHQ-9 Score 3 5  Difficult doing work/chores Somewhat difficult Somewhat difficult    Quality of Life:   Personal Goals: Goals established at orientation with interventions provided to work toward goal.  Personal Goals and Risk Factors at Admission - 04/24/20 1015       Core Components/Risk Factors/Patient Goals on Admission   Improve shortness of breath with ADL's Yes    Intervention Provide education, individualized exercise plan and daily activity instruction to help decrease symptoms of SOB with activities of daily living.    Expected Outcomes Short Term: Improve cardiorespiratory fitness to achieve a reduction of symptoms when performing ADLs;Long Term: Be able to  perform more ADLs without symptoms or delay the onset of symptoms              Personal Goals Discharge:  Goals and Risk Factor Review     Row Name 04/24/20 1016 05/22/20 1217 06/26/20 1330         Core Components/Risk Factors/Patient Goals Review   Personal Goals Review Develop more efficient breathing techniques such as purse lipped breathing and diaphragmatic breathing and practicing self-pacing with activity.;Increase knowledge of respiratory medications and ability to use respiratory devices properly.;Improve shortness of breath with ADL's Develop more efficient breathing techniques such as purse lipped breathing and diaphragmatic breathing and practicing self-pacing with activity.;Increase knowledge of respiratory medications and ability to use respiratory devices properly.;Improve shortness of breath with ADL's Develop more efficient breathing techniques such as purse lipped breathing and diaphragmatic breathing and practicing self-pacing with activity.;Increase knowledge of respiratory medications and ability to use respiratory devices properly.;Improve shortness of breath with ADL's     Review -- Netty Starring is 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  He requires 2L of supplemental oxygen to exercise in pulmonary rehab.  [facility] to qualify him for oxgen at home. He has been taught purse lip breathing to use when exercising . 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  His attendance is great and he works hard when he is here, but he has been difficult to increase workloads d/t his shortness of breath and fatigue.     Expected Outcomes -- See admission goals. See admission goals.              Exercise Goals and Review:  Exercise Goals     Row Name 04/24/20 1212             Exercise Goals   Increase Physical Activity Yes       Intervention Provide advice, education, support and counseling about  physical activity/exercise needs.;Develop an individualized exercise prescription for aerobic and resistive training based on initial evaluation findings, risk stratification, comorbidities and participant's personal goals.       Expected Outcomes Short Term: Attend rehab on a regular basis to increase amount of physical activity.;Long Term: Add in home exercise to make exercise part of routine and to increase amount of physical activity.;Long Term: Exercising regularly at least 3-5 days a week.       Increase Strength and Stamina Yes       Intervention Provide advice, education, support and counseling about physical activity/exercise needs.;Develop an individualized exercise prescription for aerobic and resistive training based on initial  evaluation findings, risk stratification, comorbidities and participant's personal goals.       Expected Outcomes Short Term: Increase workloads from initial exercise prescription for resistance, speed, and METs.;Short Term: Perform resistance training exercises routinely during rehab and add in resistance training at home;Long Term: Improve cardiorespiratory fitness, muscular endurance and strength as measured by increased METs and functional capacity (6MWT)       Able to understand and use rate of perceived exertion (RPE) scale Yes       Intervention Provide education and explanation on how to use RPE scale       Expected Outcomes Short Term: Able to use RPE daily in rehab to express subjective intensity level;Long Term:  Able to use RPE to guide intensity level when exercising independently       Able to understand and use Dyspnea scale Yes       Intervention Provide education and explanation on how to use Dyspnea scale       Expected Outcomes Short Term: Able to use Dyspnea scale daily in rehab to express subjective sense of shortness of breath during exertion;Long Term: Able to use Dyspnea scale to guide intensity level when exercising independently       Knowledge  and understanding of Target Heart Rate Range (THRR) Yes       Intervention Provide education and explanation of THRR including how the numbers were predicted and where they are located for reference       Expected Outcomes Short Term: Able to state/look up THRR;Long Term: Able to use THRR to govern intensity when exercising independently;Short Term: Able to use daily as guideline for intensity in rehab       Understanding of Exercise Prescription Yes       Intervention Provide education, explanation, and written materials on patient's individual exercise prescription       Expected Outcomes Short Term: Able to explain program exercise prescription;Long Term: Able to explain home exercise prescription to exercise independently                Exercise Goals Re-Evaluation:  Exercise Goals Re-Evaluation     Row Name 05/01/20 0900 05/25/20 0742 06/26/20 0945         Exercise Goal Re-Evaluation   Exercise Goals Review Increase Physical Activity;Increase Strength and Stamina;Able to understand and use rate of perceived exertion (RPE) scale;Able to understand and use Dyspnea scale;Knowledge and understanding of Target Heart Rate Range (THRR);Understanding of Exercise Prescription Increase Physical Activity;Increase Strength and Stamina;Able to understand and use rate of perceived exertion (RPE) scale;Able to understand and use Dyspnea scale;Knowledge and understanding of Target Heart Rate Range (THRR);Understanding of Exercise Prescription Increase Physical Activity;Increase Strength and Stamina;Able to understand and use rate of perceived exertion (RPE) scale;Able to understand and use Dyspnea scale;Knowledge and understanding of Target Heart Rate Range (THRR);Understanding of Exercise Prescription     Comments Pt is scheduled to start exercise this week. We will monitor and make changes as needed. Yomar has completed 7 exercise sessions and has tolerated well so far. He is very deconditioned but is  able to do 30 minutes on the Nustep with resting. He is also able to do the warm up and resistance exercises sitting in a chair. He is exercising at 1.4 METS on the Nustep. Will continue to monitor and progress as he is able. Jarius has completed 16 exercise sessions and has been slow to make much progress due to severe deconditioning and SOB. He still has not recieved home supplemental  O2 so he is unable to do exercise at home. He is currently exercising at 1.6 METS on the Nustep. He is scheduled to graduate from the program this week. Will continue to monitor until then.     Expected Outcomes Through exercise at rehab and home, the patient will decrease shortness of breath with daily activities and feel confident in carrying out an exercise regimn at home. Through exercise at rehab and home, the patient will decrease shortness of breath with daily activities and feel confident in carrying out an exercise regimn at home. Through exercise at rehab and home, the patient will decrease shortness of breath with daily activities and feel confident in carrying out an exercise regimn at home.              Nutrition & Weight - Outcomes:  Pre Biometrics - 04/24/20 0914       Pre Biometrics   Height $Remov'6\' 2"'HEmYjc$  (1.88 m)    Weight 133.2 kg    BMI (Calculated) 37.69    Grip Strength 25 kg             Post Biometrics - 06/29/20 1207        Post  Biometrics   Grip Strength 11.5 kg             Nutrition:  Nutrition Therapy & Goals - 05/08/20 1041       Personal Nutrition Goals   Nutrition Goal Pt to identify food quantities necessary to achieve weight loss of 6-24 lb at graduation from cardiac rehab.    Personal Goal #2 Pt to build a healthy plate including vegetables, fruits, whole grains, and low-fat dairy products in a heart healthy meal plan.    Personal Goal #3 Pt to reduce sugary beverages to <1 per day      Intervention Plan   Intervention Prescribe, educate and counsel regarding  individualized specific dietary modifications aiming towards targeted core components such as weight, hypertension, lipid management, diabetes, heart failure and other comorbidities.;Nutrition handout(s) given to patient.    Expected Outcomes Short Term Goal: A plan has been developed with personal nutrition goals set during dietitian appointment.;Long Term Goal: Adherence to prescribed nutrition plan.             Nutrition Discharge:   Education Questionnaire Score:  Knowledge Questionnaire Score - 06/29/20 1209       Knowledge Questionnaire Score   Post Score 15/18             Goals reviewed with patient; copy given to patient.

## 2020-10-04 ENCOUNTER — Ambulatory Visit: Payer: No Typology Code available for payment source | Admitting: Plastic Surgery

## 2020-10-12 ENCOUNTER — Ambulatory Visit (INDEPENDENT_AMBULATORY_CARE_PROVIDER_SITE_OTHER): Payer: No Typology Code available for payment source | Admitting: Plastic Surgery

## 2020-10-12 ENCOUNTER — Other Ambulatory Visit: Payer: Self-pay

## 2020-10-12 DIAGNOSIS — L910 Hypertrophic scar: Secondary | ICD-10-CM | POA: Diagnosis not present

## 2020-10-12 NOTE — Progress Notes (Signed)
Patient presents as a follow-up for right neck keloid.  I excised this previously but it has recurred.  I have tried steroid injections with some success.  The patient is interested in additional attempt at surgical excision.  On exam he has his right neck keloid.  It looks a little flatter and softer than last time.  There is a little bit of skin hypopigmentation around.  Had a long discussion with the patient about his options.  We discussed an additional steroid injection versus a second attempt at excision.  He would prefer to do a second attempt at excision.  I explained I could inject some steroid at the time of the excision which would hopefully head off the recurrence the second time around.  I did explain to him that a recurrence in some capacity is likely and he is fully understanding of that.  Additional risks we discussed were bleeding, infection, damage to surrounding structures and need for additional procedures.  We will plan to get this scheduled for him.

## 2020-12-06 ENCOUNTER — Other Ambulatory Visit: Payer: Self-pay

## 2020-12-06 ENCOUNTER — Encounter: Payer: Self-pay | Admitting: Plastic Surgery

## 2020-12-06 ENCOUNTER — Ambulatory Visit (INDEPENDENT_AMBULATORY_CARE_PROVIDER_SITE_OTHER): Payer: Medicare HMO | Admitting: Plastic Surgery

## 2020-12-06 VITALS — BP 145/85 | HR 78

## 2020-12-06 DIAGNOSIS — L910 Hypertrophic scar: Secondary | ICD-10-CM

## 2020-12-06 MED ORDER — HYDROCODONE-ACETAMINOPHEN 5-325 MG PO TABS: 1.0000 | ORAL_TABLET | Freq: Four times a day (QID) | ORAL | 0 refills | Status: DC | PRN

## 2020-12-06 NOTE — Addendum Note (Signed)
Addended by: Cindra Presume on: 12/06/2020 05:24 PM   Modules accepted: Orders

## 2020-12-06 NOTE — Progress Notes (Signed)
Operative Note   DATE OF OPERATION: 12/06/2020  LOCATION:    SURGICAL DEPARTMENT: Plastic Surgery  PREOPERATIVE DIAGNOSES: Right neck keloid  POSTOPERATIVE DIAGNOSES:  same  PROCEDURE:  Excision of right neck keloid measuring 4.5 cm Complex closure measuring 4.5 cm  SURGEON: Talmadge Coventry, MD  ANESTHESIA:  Local  COMPLICATIONS: None.   INDICATIONS FOR PROCEDURE:  The patient, Jeremy Landry is a 75 y.o. male born on 1945-08-21, is here for treatment of right neck keloid MRN: [id number]  CONSENT:  Informed consent was obtained directly from the patient. Risks, benefits and alternatives were fully discussed. Specific risks including but not limited to bleeding, infection, hematoma, seroma, scarring, pain, infection, wound healing problems, and need for further surgery were all discussed. The patient did have an ample opportunity to have questions answered to satisfaction.   DESCRIPTION OF PROCEDURE:  Local anesthesia was administered. The patient's operative site was prepped and draped in a sterile fashion. A time out was performed and all information was confirmed to be correct.  The lesion was excised with a 15 blade.  Hemostasis was obtained.  Circumferential undermining was performed and the skin was advanced and closed in layers with interrupted buried Monocryl sutures and 5-0 fast gut for the skin.  The lesion excised measured 4.5 cm, and the total length of closure measured 4.5 cm.    The patient tolerated the procedure well.  There were no complications.

## 2020-12-28 ENCOUNTER — Ambulatory Visit (INDEPENDENT_AMBULATORY_CARE_PROVIDER_SITE_OTHER): Payer: Medicare HMO | Admitting: Surgical

## 2020-12-28 ENCOUNTER — Other Ambulatory Visit: Payer: Self-pay

## 2020-12-28 DIAGNOSIS — L910 Hypertrophic scar: Secondary | ICD-10-CM

## 2020-12-28 NOTE — Progress Notes (Signed)
° °  Referring Provider Sonia Side., FNP [place],  [place] [id number]   CC: No chief complaint on file.     Jeremy Landry is an 75 y.o. male.  HPI: Patient is a 75 year old male here for follow-up after excision of right neck keloid with Dr. Claudia Desanctis on 12/06/2020.  He is 3 weeks postop.  He is overall very pleased with his outcome and has no complaints.  He previously did have some steroid injections in this area.  He is not having any infectious symptoms  Review of Systems General: Fevers or chills  Physical Exam Vitals with BMI 12/06/2020 06/20/2020 06/06/2020  Height - - -  Weight - 288 lbs 13 oz 292 lbs 5 oz  BMI - - -  Systolic 094 - -  Diastolic 85 - -  Pulse 78 - -    General:  No acute distress,  Alert and oriented, Non-Toxic, Normal speech and affect Neck: Right neck incision is intact and healing well, the area is not hypertrophic or keloiding at this time.  Do not feel any subcutaneous fluid collections with palpation.  There is no surrounding erythema or cellulitic changes.    Assessment/Plan We discussed watchful waiting versus steroid injection today to decrease risk of further keloiding.  We discussed the risks of steroid injection.  All of his questions were answered to his content.  Patient opted to continue to monitor the area and if he notices any changes he will call to schedule Kenalog injection.  There is no signs of infection on exam.  Recommend calling with questions or concerns.  Jeremy Landry Jeremy Landry 12/28/2020, 10:38 AM

## 2021-06-06 ENCOUNTER — Encounter: Payer: Self-pay | Admitting: Internal Medicine

## 2021-06-06 ENCOUNTER — Ambulatory Visit (INDEPENDENT_AMBULATORY_CARE_PROVIDER_SITE_OTHER): Payer: Medicare HMO | Admitting: Internal Medicine

## 2021-06-06 VITALS — BP 124/82 | HR 85 | Temp 98.2°F | Ht 74.0 in | Wt 283.0 lb

## 2021-06-06 DIAGNOSIS — J449 Chronic obstructive pulmonary disease, unspecified: Secondary | ICD-10-CM

## 2021-06-06 DIAGNOSIS — C3402 Malignant neoplasm of left main bronchus: Secondary | ICD-10-CM | POA: Diagnosis not present

## 2021-06-06 DIAGNOSIS — J9611 Chronic respiratory failure with hypoxia: Secondary | ICD-10-CM

## 2021-06-06 DIAGNOSIS — G4733 Obstructive sleep apnea (adult) (pediatric): Secondary | ICD-10-CM

## 2021-06-06 NOTE — Patient Instructions (Addendum)
Please schedule follow up scheduled with myself in 2 months.  If my schedule is not open yet, we will contact you with a reminder closer to that time. Please call [phone] if you haven't heard from [place] a month before.   Before your next visit I would like you to have: Full set of PFTs - same day as next visit.  Split night sleep study - will call you to schedule, there is a wait.   I will obtain your records from the [place] in the meantime.   Start taking you breztri inhaler 2 puffs in the morning, 2 puffs at night, gargle after use.   Take the albuterol rescue inhaler every 4 to 6 hours as needed for wheezing or shortness of breath. You can also take it 15 minutes before exercise or exertional activity. Side effects include heart racing or pounding, jitters or anxiety. If you have a history of an irregular heart rhythm, it can make this worse. Can also give some patients a hard time sleeping.  Understanding COPD   What is COPD? COPD stands for chronic obstructive pulmonary (lung) disease. COPD is a general term used for several lung diseases.  COPD is an umbrella term and encompasses other  common diseases in this group like chronic bronchitis and emphysema. Chronic asthma may also be included in this group. While some patients with COPD have only chronic bronchitis or emphysema, most patients have a combination of both.  You might hear these terms used in exchange for one another.   COPD adds to the work of the heart. Diseased lungs may reduce the amount of oxygen that goes to the blood. High blood pressure in blood vessels from the heart to the lungs makes it difficult for the heart to pump. Lung disease can also cause the body to produce too many red blood cells which may make the blood thicker and harder to pump.   Patients who have COPD with low oxygen levels may develop an enlarged heart (cor pulmonale). This condition weakens the heart and causes increased shortness of breath and  swelling in the legs and feet.   Chronic bronchitis Chronic bronchitis is irritation and inflammation (swelling) of the lining in the bronchial tubes (air passages). The irritation causes coughing and an excess amount of mucus in the airways. The swelling makes it difficult to get air in and out of the lungs. The small, hair-like structures on the inside of the airways (called cilia) may be damaged by the irritation. The cilia are then unable to help clean mucus from the airways.  Bronchitis is generally considered to be chronic when you have: a productive cough (cough up mucus) and shortness of breath that lasts about 3 months or more each year for 2 or more years in a row. Your doctor may define chronic bronchitis differently.   Emphysema Emphysema is the destruction, or breakdown, of the walls of the alveoli (air sacs) located at the end of the bronchial tubes. The damaged alveoli are not able to exchange oxygen and carbon dioxide between the lungs and the blood. The bronchioles lose their elasticity and collapse when you exhale, trapping air in the lungs. The trapped air keeps fresh air and oxygen from entering the lungs.   Who is affected by COPD? Emphysema and chronic bronchitis affect approximately 16 million people in the [place], or close to 11 percent of the population.   Symptoms of COPD  Shortness of breath  Shortness of breath with mild  exercise (walking, using the stairs, etc.)  Chronic, productive cough (with mucus)  A feeling of "tightness" in the chest  Wheezing   What causes COPD? The two primary causes of COPD are cigarette smoking and alpha1-antitrypsin (AAT) deficiency. Air pollution and occupational dusts may also contribute to COPD, especially when the person exposed to these substances is a cigarette smoker.  Cigarette smoke causes COPD by irritating the airways and creating inflammation that narrows the airways, making it more difficult to breathe. Cigarette smoke  also causes the cilia to stop working properly so mucus and trapped particles are not cleaned from the airways. As a result, chronic cough and excess mucus production develop, leading to chronic bronchitis.  In some people, chronic bronchitis and infections can lead to destruction of the small airways, or emphysema.  AAT deficiency, an inherited disorder, can also lead to emphysema. Alpha antitrypsin (AAT) is a protective material produced in the liver and transported to the lungs to help combat inflammation. When there is not enough of the chemical AAT, the body is no longer protected from an enzyme in the white blood cells.   How is COPD diagnosed?  To diagnose COPD, the physician needs to know: Do you smoke?  Have you had chronic exposure to dust or air pollutants?  Do other members of your family have lung disease?  Are you short of breath?  Do you get short of breath with exercise?  Do you have chronic cough and/or wheezing?  Do you cough up excess mucus?  To help with the diagnosis, the physician will conduct a thorough physical exam which includes:  Listening to your lungs and heart  Checking your blood pressure and pulse  Examining your nose and throat  Checking your feet and ankles for swelling   Laboratory and other tests Several laboratory and other tests are needed to confirm a diagnosis of COPD. These tests may include:  Chest X-ray to look for lung changes that could be caused by COPD   Spirometry and pulmonary function tests (PFTs) to determine lung volume and air flow  Pulse oximetry to measure the saturation of oxygen in the blood  Arterial blood gases (ABGs) to determine the amount of oxygen and carbon dioxide in the blood  Exercise testing to determine if the oxygen level in the blood drops during exercise   Treatment In the beginning stages of COPD, there is minimal shortness of breath that may be noticed only during exercise. As the disease progresses, shortness of  breath may worsen and you may need to wear an oxygen device.   To help control other symptoms of COPD, the following treatments and lifestyle changes may be prescribed.  Quitting smoking  Avoiding cigarette smoke and other irritants  Taking medications including: a. bronchodilators b. anti-inflammatory agents c. oxygen d. antibiotics  Maintaining a healthy diet  Following a structured exercise program such as pulmonary rehabilitation Preventing respiratory infections  Controlling stress   If your COPD progresses, you may be eligible to be evaluated for lung volume reduction surgery or lung transplantation. You may also be eligible to participate in certain clinical trials (research studies). Ask your health care providers about studies being conducted in your hospital.   What is the outlook? Although COPD can not be cured, its symptoms can be treated and your quality of life can be improved. Your prognosis or outlook for the future will depend on how well your lungs are functioning, your symptoms, and how well you respond to and  follow your treatment plan.

## 2021-06-06 NOTE — Progress Notes (Signed)
Jeremy Landry Landry    [id number]    08/04/1945  Primary Care Physician:Landry, Jeremy Limes., FNP  Referring Physician: Sonia Side., Jeremy Landry,  Jeremy Landry [id number] Reason for Consultation: shortness of breath Date of Consultation: 06/06/2021  Chief complaint:   Chief Complaint  Patient presents with   Consult    He reports that he has noticed increase shortness of breath with exertion in the last couple of months.      HPI: Jeremy Landry is a 76 y.o. man who presents for new patient evaluation of shortness of breath. Has been seen in our practice over 3 years ago by Dr. Melvyn Landry so here as a new patient today for shortness of breath. He feels short of breath at rest and with exertion. He has dyspnea that wakes him up in his sleep. He has never had a sleep study for sleep apnea - he was scheduled in the past and wasn't able to make the appointment.   He has been on oxygen for over 10 years. He is currently on 2-3LNC.   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 Never saw a surgeon or had discussion of surgery. This was done at the [place]. His last scan at the [place] was just a couple of months ago.   He is able to do most of his ADLs but relies on his brother as well.   He takes breztri 2 puffs once a day. His inhaler is on empty and he had no idea.  Albuterol inhaler 2-3 times/day.   Denies frequent flares of his COPD requiring steroids or abx.   He did pulmonary rehab at [place] last year sent from the [place] and had a good experience.   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 ives at home with his brother.  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   Sexual activity: Not on file    Relevant family history:  Family History  Problem Relation Age of Onset   Bone cancer Brother        in leg   Colon cancer Maternal Uncle     Past Medical History:  Diagnosis Date   Alcoholism ([place])    Anxiety    Arthritis    Chronic headaches    Congestive heart failure (HCC)    COPD (chronic obstructive pulmonary disease) (HCC)    Depression    GERD (gastroesophageal reflux disease)  Glaucoma    History of kidney stones    Hypertension    Kidney stones    Lung cancer ([place])    Pneumonia    Prostate cancer ([place])    Stroke ([place])    " small stroke one time "   Wears glasses    Wears partial dentures     Past Surgical History:  Procedure Laterality Date   CERVICAL DISC SURGERY     COLONOSCOPY WITH PROPOFOL N/A 09/05/2015   Procedure: COLONOSCOPY WITH PROPOFOL;  Surgeon: Doran Stabler, MD;  [facility]: [facility];  Service: Gastroenterology;  Laterality: N/A;   LIPOMA EXCISION Left 10/28/2018   Procedure: EXCISION SUBCUTANEOUS LIPOMA LEFT SHOULDER;  Surgeon: Donnie Mesa, MD;  Location: [place];  Service: General;  Laterality: Left;  LMA   LUMBAR DISC SURGERY     x 3   RADIOLOGY WITH ANESTHESIA N/A 02/06/2017   Procedure: MRI OF THE BRAIN WITH AND WITHOUT CONTRAST;  Surgeon: Radiologist, Medication, MD;  Location: [place];  Service: Radiology;  Laterality: N/A;     Physical Exam: Blood pressure 124/82, pulse 85,  temperature 98.2 F (36.8 C), temperature source Oral, height 6\' 2"  (1.88 m), weight 283 lb (128.4 kg), SpO2 93 %. Gen:      No acute distress on nasal cannula ENT:  no nasal polyps, mucus membranes moist Lungs:    No increased respiratory effort, symmetric chest wall excursion, clear to auscultation bilaterally, no wheezes or crackles CV:         Regular rate and rhythm; no murmurs, rubs, or gallops.  No pedal edema Abd:      + bowel sounds; soft, non-tender; obese MSK: no acute synovitis of DIP or PIP joints, no mechanics hands.  Skin:      Warm and dry; no rashes Neuro: normal speech, no focal facial asymmetry Psych: alert and oriented x3, normal mood and affect   Data Reviewed/Medical Decision Making:  Independent interpretation of tests: Imaging:  Review of patient's PET scan images revealed right hilar mass, pet avid, consistent with lung cancer. Centrilobular emphysema. The patient's images have been independently reviewed by me.    PFTs: I have personally reviewed the patient's PFTs and spirometry from 2019 shows no airflow limitation and suggests restriction.     View : No data to display.          Labs:  Lab Results  Component Value Date   WBC 4.2 10/28/2018   HGB 12.3 (L) 10/28/2018   HCT 38.3 (L) 10/28/2018   MCV 93.6 10/28/2018   PLT 217 10/28/2018   Lab Results  Component Value Date   NA 139 10/28/2018   K 4.2 10/28/2018   CL 108 10/28/2018   CO2 21 (L) 10/28/2018     Immunization status:  Immunization History  Administered Date(s) Administered   Fluad Quad(high Dose 65+) 11/16/2020   H1N1 03/21/2008   Influenza Split 11/18/2015   Influenza, High Dose Seasonal PF 10/07/2016, 09/30/2017   Influenza, Seasonal, Injecte, Preservative Fre 12/29/2008, 09/27/2009, 12/24/2010, 09/25/2012, 10/04/2013   Influenza-Unspecified 11/25/2000, 10/26/2001, 01/04/2005, 12/03/2005, 11/21/2006, 10/12/2007, 10/04/2016, 09/30/2017, 09/08/2018, 10/08/2019   Moderna  Covid-19 Vaccine Bivalent Booster 29yrs & up 12/01/2020   Moderna Sars-Covid-2 Vaccination 03/08/2019   PFIZER(Purple Top)SARS-COV-2 Vaccination 03/05/2019, 03/30/2019, 09/28/2019, 06/12/2020   Pneumococcal Conjugate-13 09/07/2015, 10/09/2016   Pneumococcal Polysaccharide-23 11/25/2000, 12/03/2005, 09/07/2015   Tdap 09/07/2015     I reviewed prior external note(s) from pulmonary, cardiac rehab  I reviewed the result(s) of the labs  and imaging as noted above.   I have ordered sleep study, pft,    Assessment:  Chronic respiratory failure COPD, stage unknown High concern for OSA  Plan/Recommendations:  Increase breztri to 2 puffs twice a day. He is currently only taking daily. Gargle after use. Continue prn albuterol Obtain updated pfts Split night study ordered.   Will need to obtain records from the [place] for his recent chest imaging to get a sense of where his lung cancer stands.   We discussed disease management and progression at length today.    Return to Care: Return in about 2 months (around 08/06/2021).  Lenice Llamas, MD Pulmonary and [place]  CC: Jeremy Side., FNP

## 2021-06-19 ENCOUNTER — Telehealth: Payer: Self-pay | Admitting: Internal Medicine

## 2021-07-04 NOTE — Telephone Encounter (Signed)
Noted     Closing encounter

## 2021-08-02 ENCOUNTER — Ambulatory Visit
Admission: RE | Admit: 2021-08-02 | Discharge: 2021-08-02 | Disposition: A | Discharge status: Home / Self Care | Payer: Medicare HMO | Source: Ambulatory Visit | Attending: Family | Admitting: Family

## 2021-08-02 ENCOUNTER — Other Ambulatory Visit: Payer: Self-pay | Admitting: Family

## 2021-08-02 DIAGNOSIS — M25512 Pain in left shoulder: Secondary | ICD-10-CM

## 2021-08-02 DIAGNOSIS — M79602 Pain in left arm: Secondary | ICD-10-CM

## 2021-08-02 DIAGNOSIS — M79642 Pain in left hand: Secondary | ICD-10-CM

## 2021-08-05 ENCOUNTER — Ambulatory Visit (HOSPITAL_BASED_OUTPATIENT_CLINIC_OR_DEPARTMENT_OTHER): Payer: Medicare HMO | Attending: Internal Medicine | Admitting: Pulmonary Disease

## 2021-08-05 DIAGNOSIS — E669 Obesity, unspecified: Secondary | ICD-10-CM | POA: Diagnosis not present

## 2021-08-05 DIAGNOSIS — J9611 Chronic respiratory failure with hypoxia: Secondary | ICD-10-CM | POA: Diagnosis not present

## 2021-08-05 DIAGNOSIS — J449 Chronic obstructive pulmonary disease, unspecified: Secondary | ICD-10-CM | POA: Insufficient documentation

## 2021-08-05 DIAGNOSIS — G4733 Obstructive sleep apnea (adult) (pediatric): Secondary | ICD-10-CM | POA: Diagnosis present

## 2021-08-08 ENCOUNTER — Ambulatory Visit: Payer: Medicare HMO | Admitting: Internal Medicine

## 2021-08-08 ENCOUNTER — Encounter: Payer: Self-pay | Admitting: Internal Medicine

## 2021-08-08 ENCOUNTER — Ambulatory Visit (INDEPENDENT_AMBULATORY_CARE_PROVIDER_SITE_OTHER): Payer: Medicare HMO | Admitting: Internal Medicine

## 2021-08-08 VITALS — BP 144/94 | HR 83 | Temp 98.3°F | Ht 74.0 in | Wt 285.0 lb

## 2021-08-08 DIAGNOSIS — Z85118 Personal history of other malignant neoplasm of bronchus and lung: Secondary | ICD-10-CM | POA: Diagnosis not present

## 2021-08-08 DIAGNOSIS — J449 Chronic obstructive pulmonary disease, unspecified: Secondary | ICD-10-CM | POA: Diagnosis not present

## 2021-08-08 DIAGNOSIS — J441 Chronic obstructive pulmonary disease with (acute) exacerbation: Secondary | ICD-10-CM

## 2021-08-08 DIAGNOSIS — J4489 Other specified chronic obstructive pulmonary disease: Secondary | ICD-10-CM

## 2021-08-08 DIAGNOSIS — J9611 Chronic respiratory failure with hypoxia: Secondary | ICD-10-CM

## 2021-08-08 NOTE — Patient Instructions (Signed)
Please schedule follow up scheduled with myself in 3 months.  If my schedule is not open yet, we will contact you with a reminder closer to that time. Please call [phone] if you haven't heard from [place] a month before.   Continue Breztri 2 puffs twice a day. Continue albuterol as needed.   Continue to stay as active as you can.   Will see you in the fall to make sure you get all you immunizations.   Will review your scans from the [facility] for your lung cancer surveillance.

## 2021-08-08 NOTE — Patient Instructions (Signed)
Full PFT performed today. °

## 2021-08-08 NOTE — Progress Notes (Signed)
Full PFT performed today. °

## 2021-08-08 NOTE — Progress Notes (Signed)
Jeremy Landry    [id number]    [birth year]  Primary [facility], Malva Limes., FNP Date of Appointment: 08/08/2021 Established Patient Visit  Chief complaint:   Chief Complaint  Patient presents with   Follow-up    Patient is here to go over PFT results.      HPI: Jeremy Landry  is a 76 y.o.a man who follows at the [place]. He has COPD on home oxygen 4L pulse through POC and stage 1B lung cancer treated with radiation at the [place] in 2019.   Interval Updates: Here for follow up after PFTS which show severe airflow limitation FEV1 43% of predicted.  Feels improvement with increased dose breztri. Albuterol use approx once/day.  Drove himself here today. Tries to stay active. Lives at home with brother who assists with some advanced ADLs but he can take care of himself.   I have reviewed the patient's family social and past medical history and updated as appropriate.   Past Medical History:  Diagnosis Date   Alcoholism ([place])    Anxiety    Arthritis    Chronic headaches    Congestive heart failure (HCC)    COPD (chronic obstructive pulmonary disease) (HCC)    Depression    GERD (gastroesophageal reflux disease)    Glaucoma    History of kidney stones    Hypertension    Kidney stones    Lung cancer ([place])    Pneumonia    Prostate cancer ([place])    Stroke ([place])    " small stroke one time "   Wears glasses    Wears partial dentures     Past Surgical History:  Procedure Laterality Date   CERVICAL DISC SURGERY     COLONOSCOPY WITH PROPOFOL N/A 09/05/2015   Procedure: COLONOSCOPY WITH PROPOFOL;  Surgeon: Doran Stabler, MD;  [facility];  Service: Gastroenterology;  Laterality: N/A;   LIPOMA EXCISION Left 10/28/2018   Procedure: EXCISION SUBCUTANEOUS LIPOMA LEFT SHOULDER;  Surgeon: Donnie Mesa, MD;  Location: [place];  Service: General;  Laterality: Left;  LMA   LUMBAR DISC SURGERY     x 3   RADIOLOGY WITH ANESTHESIA N/A 02/06/2017   Procedure:  MRI OF THE BRAIN WITH AND WITHOUT CONTRAST;  Surgeon: Radiologist, Medication, MD;  Location: [place];  Service: Radiology;  Laterality: N/A;    Family History  Problem Relation Age of Onset   Bone cancer Brother        in leg   Colon cancer Maternal Uncle     Social History   Occupational History   Occupation: retired  Tobacco Use   Smoking status: Former    Packs/day: 1.00    Years: 40.00    Total pack years: 40.00    Types: Cigarettes    Quit date: 07/23/2014    Years since quitting: 7.0   Smokeless tobacco: Never  Vaping Use   Vaping Use: Former   Quit date: 12/07/2016  Substance and Sexual Activity   Alcohol use: Yes    Alcohol/week: 0.0 standard drinks of alcohol    Comment: ocassional beer once or twice a week   Drug use: No    Comment: 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  Gen:      No acute distress, on nasal cannula ENT:  no nasal polyps, mucus membranes moist Lungs:    No increased respiratory effort, symmetric chest wall excursion, clear to auscultation bilaterally, diminished, no wheezes CV:         Regular rate and rhythm; no murmurs, rubs, or gallops.  No pedal edema   Data Reviewed: Imaging: I have personally reviewed the   PFTs:      No data to display         I have personally reviewed the patient's PFTs and severe airflow limitation with significant response to BD and reduced DLCO.   Labs:  Immunization status: Immunization History  Administered Date(s) Administered   Fluad Quad(high Dose 65+) 11/16/2020   H1N1 03/21/2008   Influenza Split 11/18/2015   Influenza, High Dose Seasonal PF 10/07/2016, 09/30/2017   Influenza, Seasonal, Injecte, Preservative Fre 12/29/2008, 09/27/2009, 12/24/2010, 09/25/2012, 10/04/2013    Influenza-Unspecified 11/25/2000, 10/26/2001, 01/04/2005, 12/03/2005, 11/21/2006, 10/12/2007, 10/04/2016, 09/30/2017, 09/08/2018, 10/08/2019   Moderna Covid-19 Vaccine Bivalent Booster 57yrs & up 12/01/2020   Moderna Sars-Covid-2 Vaccination 03/08/2019   PFIZER(Purple Top)SARS-COV-2 Vaccination 03/05/2019, 03/30/2019, 09/28/2019, 06/12/2020   PNEUMOCOCCAL CONJUGATE-20 06/22/2021   Pneumococcal Conjugate-13 09/07/2015, 10/09/2016   Pneumococcal Polysaccharide-23 11/25/2000, 12/03/2005, 09/07/2015   Tdap 09/07/2015    External Records Personally Reviewed:   Assessment:  COPD Gold Stage 4 FEV1 43% of predicted Stage 1B lung cancer 2019 treated by SBRT only   Plan/Recommendations: Continue Breztri 2 puffs twice a day. Continue albuterol as needed.   Continue to stay as active as you can.   Will see you in the fall to make sure you get all you immunizations.   Will review your scans from the [facility] for your lung cancer surveillance.      Return to Care: Return in about 3 months (around 11/08/2021).   Lenice Llamas, MD Pulmonary and [place]

## 2021-08-09 LAB — PULMONARY FUNCTION TEST
DL/VA % pred: 56 %
DL/VA: 2.19 ml/min/mmHg/L
DLCO cor % pred: 39 %
DLCO cor: 11.03 ml/min/mmHg
DLCO unc % pred: 39 %
DLCO unc: 11.03 ml/min/mmHg
FEF 25-75 Post: 1.26 L/sec
FEF 25-75 Pre: 0.98 L/sec
FEF2575-%Change-Post: 28 %
FEF2575-%Pred-Post: 49 %
FEF2575-%Pred-Pre: 38 %
FEV1-%Change-Post: 12 %
FEV1-%Pred-Post: 48 %
FEV1-%Pred-Pre: 43 %
FEV1-Post: 1.72 L
FEV1-Pre: 1.53 L
FEV1FVC-%Change-Post: 0 %
FEV1FVC-%Pred-Pre: 89 %
FEV6-%Change-Post: 12 %
FEV6-%Pred-Post: 56 %
FEV6-%Pred-Pre: 50 %
FEV6-Post: 2.62 L
FEV6-Pre: 2.32 L
FEV6FVC-%Change-Post: 0 %
FEV6FVC-%Pred-Post: 105 %
FEV6FVC-%Pred-Pre: 106 %
FVC-%Change-Post: 11 %
FVC-%Pred-Post: 54 %
FVC-%Pred-Pre: 48 %
FVC-Post: 2.64 L
FVC-Pre: 2.37 L
Post FEV1/FVC ratio: 65 %
Post FEV6/FVC ratio: 99 %
Pre FEV1/FVC ratio: 65 %
Pre FEV6/FVC Ratio: 100 %
RV % pred: 120 %
RV: 3.35 L
TLC % pred: 76 %
TLC: 5.98 L

## 2021-08-14 DIAGNOSIS — G4733 Obstructive sleep apnea (adult) (pediatric): Secondary | ICD-10-CM | POA: Diagnosis not present

## 2021-08-14 NOTE — Procedures (Signed)
    Patient Name: Jeremy Landry, Jeremy Landry Date: 08/05/2021 Gender: Male D.O.B: December 01, 1945 Age (years): 44 Referring Provider: Lenice Llamas MD Height (inches): 64 Interpreting Physician: Chesley Mires MD, ABSM Weight (lbs): 283 RPSGT: Baxter Flattery BMI: 16 MRN: [id number] Neck Size: 19.00  CLINICAL INFORMATION Sleep Study Type: NPSG  Indication for sleep study: Obesity, Snoring, COPD, Respiratory failure.  Epworth Sleepiness Score: 13  SLEEP STUDY TECHNIQUE As per the AASM Manual for the Scoring of Sleep and Associated Events v2.3 (April 2016) with a hypopnea requiring 4% desaturations.  The channels recorded and monitored were frontal, central and occipital EEG, electrooculogram (EOG), submentalis EMG (chin), nasal and oral airflow, thoracic and abdominal wall motion, anterior tibialis EMG, snore microphone, electrocardiogram, and pulse oximetry.  MEDICATIONS Medications self-administered by patient taken the night of the study : N/A  SLEEP ARCHITECTURE The study was initiated at 10:16:04 PM and ended at 4:54:05 AM.  Sleep onset time was 28.0 minutes and the sleep efficiency was 44.1%%. The total sleep time was 175.5 minutes.  Stage REM latency was 205.5 minutes.  The patient spent 2.6%% of the night in stage N1 sleep, 77.5%% in stage N2 sleep, 0.0%% in stage N3 and 19.9% in REM.  Alpha intrusion was absent.  Supine sleep was 2.49%.  RESPIRATORY PARAMETERS The overall apnea/hypopnea index (AHI) was 0.7 per hour. There were 2 total apneas, including 2 obstructive, 0 central and 0 mixed apneas. There were 0 hypopneas and 0 RERAs.  The AHI during Stage REM sleep was 1.7 per hour.  AHI while supine was 0.0 per hour.  The mean oxygen saturation was 94.9%. The minimum SpO2 during sleep was 93.0%.  He wore 2 liters oxygen during the study.  Moderate snoring was noted during this study.  CARDIAC DATA The 2 lead EKG demonstrated sinus rhythm. The mean heart rate was 65.7  beats per minute. Other EKG findings include: None.  LEG MOVEMENT DATA The total PLMS were 0 with a resulting PLMS index of 0.0. Associated arousal with leg movement index was 0.0 .  IMPRESSIONS - No significant obstructive sleep apnea occurred during this study (AHI = 0.7/h). - The patient had minimal or no oxygen desaturation during the study (Min O2 = 93.0%).  He wore 2 liters oxygen during the study. - The patient snored with moderate snoring volume.  DIAGNOSIS - Nocturnal Hypoxemia  - Snoring  RECOMMENDATIONS - Two liters oxygen at night. - Avoid alcohol, sedatives and other CNS depressants that may worsen sleep apnea and disrupt normal sleep architecture. - Sleep hygiene should be reviewed to assess factors that may improve sleep quality. - Weight management and regular exercise should be initiated or continued if appropriate.  [Electronically signed] 08/14/2021 04:25 PM  Chesley Mires MD, ABSM Diplomate, American Board of Sleep Medicine NPI: [id number]  [place] PH: [phone]   FX: [phone] [place]

## 2021-08-17 ENCOUNTER — Telehealth: Payer: Self-pay | Admitting: Internal Medicine

## 2021-08-17 NOTE — Telephone Encounter (Signed)
Sleep study reviewed and was negative for sleep apnea, but he does have some low oxygen levels at night requiring 2LNC. Please call patient and let him know he requires oxygen at night and prescribe.

## 2021-08-22 NOTE — Telephone Encounter (Signed)
ATC LVMTCB x 1  

## 2021-10-11 ENCOUNTER — Institutional Professional Consult (permissible substitution): Payer: No Typology Code available for payment source | Admitting: Plastic Surgery

## 2021-11-07 ENCOUNTER — Ambulatory Visit: Payer: Medicare HMO | Admitting: Internal Medicine

## 2021-11-07 ENCOUNTER — Encounter: Payer: Self-pay | Admitting: Internal Medicine

## 2021-11-07 VITALS — BP 128/76 | HR 72 | Temp 97.7°F | Ht 74.0 in | Wt 294.6 lb

## 2021-11-07 DIAGNOSIS — J449 Chronic obstructive pulmonary disease, unspecified: Secondary | ICD-10-CM

## 2021-11-07 DIAGNOSIS — Z23 Encounter for immunization: Secondary | ICD-10-CM

## 2021-11-07 DIAGNOSIS — J9611 Chronic respiratory failure with hypoxia: Secondary | ICD-10-CM | POA: Diagnosis not present

## 2021-11-07 NOTE — Progress Notes (Signed)
Jeremy Landry    [id number]    06-10-1945  [facility], Malva Limes., FNP Date of Appointment: 11/07/2021 Established Patient Visit  Chief complaint:   Chief Complaint  Patient presents with   Follow-up    SOB, wheeze and cough persistent     HPI: Jeremy Landry  is a 76 y.o.a man who follows at the [facility]. 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   Interval Updates: [name]  No interval hospitalizations or ED visits. No copd exacerbation.  Drove himself here today. Tries to stay active. Lives at home with brother who assists with some advanced ADLs but he can take care of himself.   He has done pulmonary rehab before - about a year ago. Didn't feel like he had a dramatic improvement in his breathing.  He has only portable tanks, no POC.   No fevers, chills, night sweats or weight loss.   I have reviewed the patient's family social and past medical history and updated as appropriate.   Past Medical History:  Diagnosis Date   Alcoholism ([place])    Anxiety    Arthritis    Chronic headaches    Congestive heart failure (HCC)    COPD (chronic obstructive pulmonary disease) (HCC)    Depression    GERD (gastroesophageal reflux disease)    Glaucoma    History of kidney stones    Hypertension    Kidney stones    Lung cancer ([place])    Pneumonia    Prostate cancer ([place])    Stroke ([place])    " small stroke one time "   Wears glasses    Wears partial dentures     Past Surgical History:  Procedure Laterality Date   CERVICAL DISC SURGERY     COLONOSCOPY WITH PROPOFOL N/A 09/05/2015   Procedure: COLONOSCOPY WITH PROPOFOL;  Surgeon: Doran Stabler, MD;  [facility];  Service: Gastroenterology;  Laterality: N/A;    LIPOMA EXCISION Left 10/28/2018   Procedure: EXCISION SUBCUTANEOUS LIPOMA LEFT SHOULDER;  Surgeon: Donnie Mesa, MD;  Location: [facility];  Service: General;  Laterality: Left;  LMA   LUMBAR DISC SURGERY     x 3   RADIOLOGY WITH ANESTHESIA N/A 02/06/2017   Procedure: MRI OF THE BRAIN WITH AND WITHOUT CONTRAST;  Surgeon: Radiologist, Medication, MD;  Location: [facility];  Service: Radiology;  Laterality: N/A;    Family History  Problem Relation Age of Onset   Bone cancer Brother        in leg   Colon cancer Maternal Uncle     Social History   Occupational History   Occupation: retired  Tobacco Use   Smoking status: Former    Packs/day: 1.00    Years: 40.00    Total pack years: 40.00    Types: Cigarettes    Quit date: 07/23/2014    Years since quitting: 7.2   Smokeless tobacco: Never  Vaping Use   Vaping Use: Former   Quit date: 12/07/2016  Substance and Sexual Activity   Alcohol use: Yes    Alcohol/week: 0.0 standard drinks of alcohol    Comment: ocassional beer once or twice a week  Drug use: No    Comment: 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  Gen:      No acute distress, on nasal cannula ENT:  no nasal polyps, mucus membranes moist Lungs:    No increased respiratory effort, symmetric chest wall excursion, clear to auscultation bilaterally, diminished, no wheezes CV:         Regular rate and rhythm; no murmurs, rubs, or gallops.  No pedal edema   Data Reviewed: Imaging: Reviewed the report from CT Chest Sept 2023 -  Similar postradiation changes in the right lung. No evidence of  recurrent or metastatic disease in the chest.   PFTs:     Latest Ref Rng & Units 08/08/2021    9:21 AM  PFT Results  FVC-Pre L 2.37   FVC-Predicted Pre % 48   FVC-Post L  2.64   FVC-Predicted Post % 54   Pre FEV1/FVC % % 65   Post FEV1/FCV % % 65   FEV1-Pre L 1.53   FEV1-Predicted Pre % 43   FEV1-Post L 1.72   DLCO uncorrected ml/min/mmHg 11.03   DLCO UNC% % 39   DLCO corrected ml/min/mmHg 11.03   DLCO COR %Predicted % 39   DLVA Predicted % 56   TLC L 5.98   TLC % Predicted % 76   RV % Predicted % 120    I have personally reviewed the patient's PFTs and severe airflow limitation with significant response to BD and reduced DLCO.   Labs:  Immunization status: Immunization History  Administered Date(s) Administered   Fluad Quad(high Dose 65+) 11/16/2020   H1N1 03/21/2008   Influenza Split 11/18/2015   Influenza, High Dose Seasonal PF 10/07/2016, 09/30/2017   Influenza, Seasonal, Injecte, Preservative Fre 12/29/2008, 09/27/2009, 12/24/2010, 09/25/2012, 10/04/2013   Influenza-Unspecified 11/25/2000, 10/26/2001, 01/04/2005, 12/03/2005, 11/21/2006, 10/12/2007, 10/04/2016, 09/30/2017, 09/08/2018, 10/08/2019   Moderna Covid-19 Vaccine Bivalent Booster 38yrs & up 12/01/2020   Moderna Sars-Covid-2 Vaccination 03/08/2019   PFIZER(Purple Top)SARS-COV-2 Vaccination 03/05/2019, 03/30/2019, 09/28/2019, 06/12/2020   PNEUMOCOCCAL CONJUGATE-20 06/22/2021   Pneumococcal Conjugate-13 09/07/2015, 10/09/2016   Pneumococcal Polysaccharide-23 11/25/2000, 12/03/2005, 09/07/2015   Tdap 09/07/2015    External Records Personally Reviewed:   Assessment:  COPD Gold Stage 4 FEV1 43% of predicted Stage 1B lung cancer 2019 treated by SBRT only   Plan/Recommendations: continue Breztri 2 puffs twice a day. continue albuterol as needed.  Will give you flu shot today.  Please get your RSV and Covid shots at any pharmacy.  You are up to date on pneumonia vaccine. [facility]   We did do ambulatory desat study today to see if he could have a POC instead of small tanks. He was not able to tolerate POC and requires continuous flow  oxygen.   Return to Care: Return in about 6 months (around 05/08/2022).   Lenice Llamas, MD Pulmonary and [place]

## 2021-11-07 NOTE — Patient Instructions (Addendum)
Please schedule follow up scheduled with myself in 6 months.  If my schedule is not open yet, we will contact you with a reminder closer to that time. Please call [phone] if you haven't heard from [place] a month before.    continue Breztri 2 puffs twice a day. continue albuterol as needed.  Will give you flu shot today.  Please get your RSV and Covid shots at any pharmacy.  You are up to date on pneumonia vaccine. [facility].

## 2021-12-06 ENCOUNTER — Encounter: Payer: Self-pay | Admitting: Plastic Surgery

## 2021-12-06 ENCOUNTER — Ambulatory Visit (INDEPENDENT_AMBULATORY_CARE_PROVIDER_SITE_OTHER): Payer: Medicare HMO | Admitting: Plastic Surgery

## 2021-12-06 VITALS — BP 136/79 | HR 90

## 2021-12-06 DIAGNOSIS — L910 Hypertrophic scar: Secondary | ICD-10-CM | POA: Diagnosis not present

## 2021-12-06 NOTE — Progress Notes (Signed)
Referring Provider Sonia Side., FNP [place],  [place] [address]   CC:  Chief Complaint  Patient presents with   Consult      Jeremy Landry is an 76 y.o. male.  HPI: Mr. Mcconaghy presents today with a  request for excision of a keloid at the right mandibular margin.  The patient has undergone excision of this keloid twice but is unclear whether he had any adjuvant therapy at the time of the procedures.  He has had an injection of the keloid with steroids in the past.  Allergies  Allergen Reactions   Aspirin Anaphylaxis   Shellfish Allergy Anaphylaxis   Other Hives    RAISINS   Peanut-Containing Drug Products Hives   Lisinopril     UNSPECIFIED REACTION     Outpatient Encounter Medications as of 12/06/2021  Medication Sig   albuterol (VENTOLIN HFA) 108 (90 Base) MCG/ACT inhaler Inhale 2 puffs into the lungs every 6 (six) hours as needed for wheezing or shortness of breath.   atorvastatin (LIPITOR) 40 MG tablet Take 40 mg by mouth daily.   brimonidine (ALPHAGAN P) 0.1 % SOLN Place 1 drop into both eyes 2 (two) times daily.   carboxymethylcellulose (REFRESH PLUS) 0.5 % SOLN Place 1 drop into both eyes 3 (three) times daily as needed (dry eyes).   Cholecalciferol 50 MCG (2000 UT) TABS Take 2,000 Units by mouth daily.   clopidogrel (PLAVIX) 75 MG tablet Take 75 mg by mouth daily.   escitalopram (LEXAPRO) 10 MG tablet Take 10 mg by mouth daily.   finasteride (PROSCAR) 5 MG tablet Take 5 mg by mouth daily.   HYDROcodone-acetaminophen (NORCO) 5-325 MG tablet Take 1 tablet by mouth every 6 (six) hours as needed for moderate pain.   HYDROcodone-acetaminophen (NORCO) 5-325 MG tablet Take 1 tablet by mouth every 6 (six) hours as needed for moderate pain.   nitroGLYCERIN (NITROSTAT) 0.4 MG SL tablet Place 0.4 mg under the tongue every 5 (five) minutes as needed for chest pain.   omeprazole (PRILOSEC) 20 MG capsule Take 20 mg by mouth daily.   oxyCODONE (OXY IR/ROXICODONE)  5 MG immediate release tablet Take 1 tablet (5 mg total) by mouth every 6 (six) hours as needed for severe pain.   OXYGEN Inhale 3-4 L/min into the lungs as needed (short of breath).   prazosin (MINIPRESS) 1 MG capsule Take 1 mg by mouth at bedtime.   terazosin (HYTRIN) 5 MG capsule Take 5 mg by mouth at bedtime.   traZODone (DESYREL) 50 MG tablet Take 50 mg by mouth at bedtime as needed for sleep.   No facility-administered encounter medications on file as of 12/06/2021.     Past Medical History:  Diagnosis Date   Alcoholism ([place])    Anxiety    Arthritis    Chronic headaches    Congestive heart failure (HCC)    COPD (chronic obstructive pulmonary disease) (HCC)    Depression    GERD (gastroesophageal reflux disease)    Glaucoma    History of kidney stones    Hypertension    Kidney stones    Lung cancer ([place])    Pneumonia    Prostate cancer ([place])    Stroke ([place])    " small stroke one time "   Wears glasses    Wears partial dentures     Past Surgical History:  Procedure Laterality Date   CERVICAL DISC SURGERY     COLONOSCOPY WITH PROPOFOL N/A  09/05/2015   Procedure: COLONOSCOPY WITH PROPOFOL;  Surgeon: Doran Stabler, MD;  [facility];  Service: Gastroenterology;  Laterality: N/A;   LIPOMA EXCISION Left 10/28/2018   Procedure: EXCISION SUBCUTANEOUS LIPOMA LEFT SHOULDER;  Surgeon: Donnie Mesa, MD;  Location: [facility];  Service: General;  Laterality: Left;  LMA   LUMBAR DISC SURGERY     x 3   RADIOLOGY WITH ANESTHESIA N/A 02/06/2017   Procedure: MRI OF THE BRAIN WITH AND WITHOUT CONTRAST;  Surgeon: Radiologist, Medication, MD;  Location: [facility];  Service: Radiology;  Laterality: N/A;    Family History  Problem Relation Age of Onset   Bone cancer Brother        in leg   Colon cancer Maternal Uncle     Social History   Social History Narrative   Not on file     Review of Systems General: Denies fevers, chills, weight loss CV: Denies chest pain,  shortness of breath, palpitations Skin: Keloid on the left mandible which he says is uncomfortable and itches.  Physical Exam    12/06/2021    2:30 PM 11/07/2021    8:51 AM 08/08/2021   10:50 AM  Vitals with BMI  Height  6\' 2"  6\' 2"   Weight  294 lbs 10 oz 285 lbs  BMI  60.73 71.06  Systolic 269 485 462  Diastolic 79 76 94  Pulse 90 72 83    General:  No acute distress,  Alert and oriented, Non-Toxic, Normal speech and affect Integument: 4 cm keloid at the right mandibular margin.   Assessment/Plan Keloid: We will plan for reexcision with immediate postoperative radiation therapy.  Will refer to radiation oncology for evaluation prior to scheduling for the procedure.  The patient understands that he is still at risk for recurrence however in my experience this recurrence is low with radiation therapy following excision. To temporize and give him some relief from his itching I injected the keloid with 1 mL of a 50-50 mix of Kenalog 40 and 1% plain lidocaine.  Will schedule for excision in the clinic.  Camillia Herter 12/06/2021, 5:17 PM

## 2021-12-12 ENCOUNTER — Telehealth: Payer: Self-pay | Admitting: Radiation Oncology

## 2021-12-12 NOTE — Telephone Encounter (Signed)
Left message to call back to schedule appointment per 11/30 referral.

## 2021-12-17 NOTE — Progress Notes (Signed)
Radiation Oncology         [phone] ________________________________  Name: Jeremy Landry        MRN: [id number]  Date of Service: 12/20/2021 DOB: [birth year]  ER:DEYCX, Malva Limes., FNP  Camillia Herter, MD     REFERRING PHYSICIAN: Camillia Herter, MD   DIAGNOSIS: There were no encounter diagnoses.   HISTORY OF PRESENT ILLNESS: Jeremy Landry is a 76 y.o. male seen at the request of Dr. Lovena Le for a diagnosis of keloid scars.  The patient has a history of keloid scars and has had an lesion in the area along his right mandible.  This has been excised on 2 separate occasions but has returned despite his excisions.  Apparently he also received steroid injections as well without long-term relief.  He is seen to consider post excisional radiotherapy and is scheduled to undergo an excision in the office on 01/10/2022.   PREVIOUS RADIATION THERAPY: {EXAM; YES/NO:19492::"No"}   PAST MEDICAL HISTORY:  Past Medical History:  Diagnosis Date   Alcoholism ([place])    Anxiety    Arthritis    Chronic headaches    Congestive heart failure (HCC)    COPD (chronic obstructive pulmonary disease) (HCC)    Depression    GERD (gastroesophageal reflux disease)    Glaucoma    History of kidney stones    Hypertension    Kidney stones    Lung cancer ([place])    Pneumonia    Prostate cancer ([place])    Stroke ([place])    " small stroke one time "   Wears glasses    Wears partial dentures        PAST SURGICAL HISTORY: Past Surgical History:  Procedure Laterality Date   CERVICAL DISC SURGERY     COLONOSCOPY WITH PROPOFOL N/A 09/05/2015   Procedure: COLONOSCOPY WITH PROPOFOL;  Surgeon: Doran Stabler, MD;  [facility];  Service: Gastroenterology;  Laterality: N/A;   LIPOMA EXCISION Left 10/28/2018   Procedure: EXCISION SUBCUTANEOUS LIPOMA LEFT SHOULDER;  Surgeon: Donnie Mesa, MD;  Location: [facility];  Service: General;  Laterality: Left;  LMA   LUMBAR DISC SURGERY     x 3    RADIOLOGY WITH ANESTHESIA N/A 02/06/2017   Procedure: MRI OF THE BRAIN WITH AND WITHOUT CONTRAST;  Surgeon: Radiologist, Medication, MD;  Location: [facility];  Service: Radiology;  Laterality: N/A;     FAMILY HISTORY:  Family History  Problem Relation Age of Onset   Bone cancer Brother        in leg   Colon cancer Maternal Uncle      SOCIAL HISTORY:  reports that he quit smoking about 7 years ago. His smoking use included cigarettes. He has a 40.00 pack-year smoking history. He has never used smokeless tobacco. He reports current alcohol use. He reports that he does not use drugs.  The patient is divorced and lives in [place].  He is retired from***   ALLERGIES: Aspirin, Shellfish allergy, Other, Peanut-containing drug products, and Lisinopril   MEDICATIONS:  Current Outpatient Medications  Medication Sig Dispense Refill   albuterol (VENTOLIN HFA) 108 (90 Base) MCG/ACT inhaler Inhale 2 puffs into the lungs every 6 (six) hours as needed for wheezing or shortness of breath.     atorvastatin (LIPITOR) 40 MG tablet Take 40 mg by mouth daily.     brimonidine (ALPHAGAN P) 0.1 % SOLN Place 1 drop into both eyes 2 (two) times daily.  carboxymethylcellulose (REFRESH PLUS) 0.5 % SOLN Place 1 drop into both eyes 3 (three) times daily as needed (dry eyes).     Cholecalciferol 50 MCG (2000 UT) TABS Take 2,000 Units by mouth daily.     clopidogrel (PLAVIX) 75 MG tablet Take 75 mg by mouth daily.     escitalopram (LEXAPRO) 10 MG tablet Take 10 mg by mouth daily.     finasteride (PROSCAR) 5 MG tablet Take 5 mg by mouth daily.     HYDROcodone-acetaminophen (NORCO) 5-325 MG tablet Take 1 tablet by mouth every 6 (six) hours as needed for moderate pain. 5 tablet 0   HYDROcodone-acetaminophen (NORCO) 5-325 MG tablet Take 1 tablet by mouth every 6 (six) hours as needed for moderate pain. 20 tablet 0   nitroGLYCERIN (NITROSTAT) 0.4 MG SL tablet Place 0.4 mg under the tongue every 5 (five) minutes as  needed for chest pain.     omeprazole (PRILOSEC) 20 MG capsule Take 20 mg by mouth daily.     oxyCODONE (OXY IR/ROXICODONE) 5 MG immediate release tablet Take 1 tablet (5 mg total) by mouth every 6 (six) hours as needed for severe pain. 15 tablet 0   OXYGEN Inhale 3-4 L/min into the lungs as needed (short of breath).     prazosin (MINIPRESS) 1 MG capsule Take 1 mg by mouth at bedtime.     terazosin (HYTRIN) 5 MG capsule Take 5 mg by mouth at bedtime.     traZODone (DESYREL) 50 MG tablet Take 50 mg by mouth at bedtime as needed for sleep.     No current facility-administered medications for this visit.     REVIEW OF SYSTEMS: On review of systems, the patient reports that ***      PHYSICAL EXAM:  Wt Readings from Last 3 Encounters:  11/07/21 294 lb 9.6 oz (133.6 kg)  08/08/21 285 lb (129.3 kg)  08/05/21 283 lb (128.4 kg)   Temp Readings from Last 3 Encounters:  11/07/21 97.7 F (36.5 C) (Oral)  08/08/21 98.3 F (36.8 C) (Oral)  06/06/21 98.2 F (36.8 C) (Oral)   BP Readings from Last 3 Encounters:  12/06/21 136/79  11/07/21 128/76  08/08/21 (!) 144/94   Pulse Readings from Last 3 Encounters:  12/06/21 90  11/07/21 72  08/08/21 83    /10  In general this is a well appearing African-American male in no acute distress.  He's alert and oriented x4 and appropriate throughout the examination. Cardiopulmonary assessment is negative for acute distress and he exhibits normal effort.  The keloid along his right mandible is visible and measures approximately***    ECOG = ***  0 - Asymptomatic (Fully active, able to carry on all predisease activities without restriction)  1 - Symptomatic but completely ambulatory (Restricted in physically strenuous activity but ambulatory and able to carry out work of a light or sedentary nature. For example, light housework, office work)  2 - Symptomatic, <50% in bed during the day (Ambulatory and capable of all self care but unable to carry  out any work activities. Up and about more than 50% of waking hours)  3 - Symptomatic, >50% in bed, but not bedbound (Capable of only limited self-care, confined to bed or chair 50% or more of waking hours)  4 - Bedbound (Completely disabled. Cannot carry on any self-care. Totally confined to bed or chair)  5 - Death   Eustace Pen MM, Creech RH, Tormey DC, et al. [phone]). "Toxicity and response criteria of the [employer]". [place] Oncol. 5 (6): 649-55    LABORATORY DATA:  Lab Results  Component Value Date   WBC 4.2 10/28/2018   HGB 12.3 (L) 10/28/2018   HCT 38.3 (L) 10/28/2018   MCV 93.6 10/28/2018   PLT 217 10/28/2018   Lab Results  Component Value Date   NA 139 10/28/2018   K 4.2 10/28/2018   CL 108 10/28/2018   CO2 21 (L) 10/28/2018   Lab Results  Component Value Date   ALT 31 10/28/2018   AST 33 10/28/2018   ALKPHOS 64 10/28/2018   BILITOT 1.0 10/28/2018      RADIOGRAPHY: No results found.     IMPRESSION/PLAN: 1. 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 Dr. Lisbeth Renshaw discusses the patient's course and the rationale for radiotherapy and certain benign conditions including keloid scarring.  We discussed the risks, benefits, short, and long term effects of radiotherapy, as well as the curative intent, and the patient is interested in proceeding. Dr. Lisbeth Renshaw discusses the delivery and logistics of radiotherapy and anticipates a course of 4 fractions timed around the course of his surgical resection.  We would anticipate electron set up in the treatment area of the department on postop day 0 followed by treatment on postop day 1 and subsequent. Written consent is obtained and placed in the chart, a copy was provided to the patient.    In a visit lasting *** minutes, greater than 50% of the time was spent face to face discussing the patient's condition, in preparation for the discussion, and coordinating the patient's care.   The above documentation  reflects my direct findings during this shared patient visit. Please see the separate note by Dr. Lisbeth Renshaw on this date for the remainder of the patient's plan of care.    Carola Rhine, [facility]   **Disclaimer: This note was dictated with voice recognition software. Similar sounding words can inadvertently be transcribed and this note may contain transcription errors which may not have been corrected upon publication of note.**

## 2021-12-18 NOTE — Progress Notes (Signed)
Histology and Location: Right Mandible Keloid   Past/Anticipated interventions by patient's surgeon/dermatologist for current problematic lesion, if any:  Dr. Lovena Le 12/06/2021 -The patient has a history of keloid scars and has had an lesion in the area along his right mandible.  - This has been excised on 2 separate occasions but has returned despite his excisions.  - Apparently he also received steroid injections as well without long-term relief.  -We will plan for reexcision with immediate postoperative radiation therapy. -January 4th Re-excision of Right Mandible Keloid.      SAFETY ISSUES: Prior radiation?  Pacemaker/ICD? No Possible current pregnancy? N/a Is the patient on methotrexate? No  Current Complaints / other details:  -Prostate Cancer -Lung Cancer -Treated at the [facility] hospital in [place].

## 2021-12-20 ENCOUNTER — Encounter: Payer: Self-pay | Admitting: Radiation Oncology

## 2021-12-20 ENCOUNTER — Ambulatory Visit
Admission: RE | Admit: 2021-12-20 | Discharge: 2021-12-20 | Disposition: A | Discharge status: Home / Self Care | Payer: Medicare HMO | Source: Ambulatory Visit | Attending: Radiation Oncology | Admitting: Radiation Oncology

## 2021-12-20 ENCOUNTER — Other Ambulatory Visit: Payer: Self-pay

## 2021-12-20 VITALS — BP 147/94 | HR 84 | Temp 97.7°F | Resp 22 | Ht 74.0 in | Wt 291.0 lb

## 2021-12-20 DIAGNOSIS — I10 Essential (primary) hypertension: Secondary | ICD-10-CM | POA: Diagnosis not present

## 2021-12-20 DIAGNOSIS — L910 Hypertrophic scar: Secondary | ICD-10-CM

## 2021-12-20 DIAGNOSIS — Z8546 Personal history of malignant neoplasm of prostate: Secondary | ICD-10-CM | POA: Diagnosis not present

## 2021-12-20 DIAGNOSIS — Z8673 Personal history of transient ischemic attack (TIA), and cerebral infarction without residual deficits: Secondary | ICD-10-CM | POA: Diagnosis not present

## 2021-12-20 DIAGNOSIS — Z79899 Other long term (current) drug therapy: Secondary | ICD-10-CM | POA: Diagnosis not present

## 2021-12-20 DIAGNOSIS — I509 Heart failure, unspecified: Secondary | ICD-10-CM | POA: Insufficient documentation

## 2021-12-20 DIAGNOSIS — J449 Chronic obstructive pulmonary disease, unspecified: Secondary | ICD-10-CM | POA: Insufficient documentation

## 2021-12-20 DIAGNOSIS — Z87891 Personal history of nicotine dependence: Secondary | ICD-10-CM | POA: Insufficient documentation

## 2021-12-20 DIAGNOSIS — Z87442 Personal history of urinary calculi: Secondary | ICD-10-CM | POA: Insufficient documentation

## 2021-12-20 DIAGNOSIS — Z923 Personal history of irradiation: Secondary | ICD-10-CM | POA: Insufficient documentation

## 2021-12-20 DIAGNOSIS — K219 Gastro-esophageal reflux disease without esophagitis: Secondary | ICD-10-CM | POA: Insufficient documentation

## 2021-12-20 DIAGNOSIS — F1010 Alcohol abuse, uncomplicated: Secondary | ICD-10-CM | POA: Diagnosis not present

## 2021-12-25 ENCOUNTER — Other Ambulatory Visit: Payer: Self-pay | Admitting: Radiation Oncology

## 2021-12-25 ENCOUNTER — Ambulatory Visit
Admission: RE | Admit: 2021-12-25 | Discharge: 2021-12-25 | Disposition: A | Discharge status: Home / Self Care | Payer: Self-pay | Source: Ambulatory Visit | Attending: Radiation Oncology | Admitting: Radiation Oncology

## 2021-12-25 DIAGNOSIS — L730 Acne keloid: Secondary | ICD-10-CM

## 2021-12-27 ENCOUNTER — Other Ambulatory Visit: Payer: Self-pay | Admitting: Radiation Oncology

## 2021-12-27 ENCOUNTER — Ambulatory Visit
Admission: RE | Admit: 2021-12-27 | Discharge: 2021-12-27 | Disposition: A | Discharge status: Home / Self Care | Payer: Self-pay | Source: Ambulatory Visit | Attending: Radiation Oncology | Admitting: Radiation Oncology

## 2021-12-27 DIAGNOSIS — L910 Hypertrophic scar: Secondary | ICD-10-CM

## 2022-01-01 ENCOUNTER — Institutional Professional Consult (permissible substitution): Payer: No Typology Code available for payment source | Admitting: Plastic Surgery

## 2022-01-10 ENCOUNTER — Ambulatory Visit (INDEPENDENT_AMBULATORY_CARE_PROVIDER_SITE_OTHER): Payer: Medicare HMO | Admitting: Plastic Surgery

## 2022-01-10 ENCOUNTER — Ambulatory Visit
Admission: RE | Admit: 2022-01-10 | Discharge: 2022-01-10 | Disposition: A | Discharge status: Home / Self Care | Payer: Medicare HMO | Source: Ambulatory Visit | Attending: Radiation Oncology | Admitting: Radiation Oncology

## 2022-01-10 ENCOUNTER — Other Ambulatory Visit: Payer: Self-pay

## 2022-01-10 VITALS — BP 138/83 | HR 83

## 2022-01-10 DIAGNOSIS — L910 Hypertrophic scar: Secondary | ICD-10-CM | POA: Diagnosis not present

## 2022-01-10 DIAGNOSIS — Z510 Encounter for antineoplastic radiation therapy: Secondary | ICD-10-CM | POA: Insufficient documentation

## 2022-01-10 NOTE — Addendum Note (Signed)
Addended by: Drema Pry on: 01/10/2022 01:45 PM   Modules accepted: Orders

## 2022-01-10 NOTE — Progress Notes (Signed)
Procedure Note  Preoperative Dx: Keloid right side of face  Postoperative Dx: Same  Procedure: Excision of keloid  Anesthesia: Lidocaine 1% with 1:100,000 epinephrine and 0.25% Sensorcaine   Indication for Procedure: Removal of keloid  Description of Procedure: Risks and complications were explained to the patient including the risk of recurrence.  Consent was confirmed and the patient understands the risks and benefits.  The potential complications and alternatives were explained and the patient consents.  The patient expressed understanding the option of not having the procedure and the risks of a scar.  Time out was called and all information was confirmed to be correct.    The area was prepped and drapped.  Local anesthetic was injected in the subcutaneous tissues.  After waiting for the local to take affect an elliptical incision was made at the base of the 2.5 cm keloid. The keloid was excised down to subcutaneous fat.  After obtaining hemostasis, the surgical wound was closed with 4-0 Monocryl in the dermis and a running 5-0 Prolene suture.  The surgical wound measured years.  A dressing was applied.  The patient was given instructions on how to care for the area and a follow up appointment.  He is scheduled for adjuvant radiation therapy for prevention of recurrence of his keloid.  He will undergo his first treatment tomorrow.  Jeremy Landry tolerated the procedure well and there were no complications. The specimen was sent to pathology.

## 2022-01-11 ENCOUNTER — Ambulatory Visit
Admission: RE | Admit: 2022-01-11 | Discharge: 2022-01-11 | Disposition: A | Discharge status: Home / Self Care | Payer: Medicare HMO | Source: Ambulatory Visit | Attending: Radiation Oncology | Admitting: Radiation Oncology

## 2022-01-11 ENCOUNTER — Other Ambulatory Visit: Payer: Self-pay

## 2022-01-11 DIAGNOSIS — Z510 Encounter for antineoplastic radiation therapy: Secondary | ICD-10-CM | POA: Diagnosis not present

## 2022-01-11 LAB — RAD ONC ARIA SESSION SUMMARY
Course Elapsed Days: 0
Plan Fractions Treated to Date: 1
Plan Prescribed Dose Per Fraction: 4 Gy
Plan Total Fractions Prescribed: 4
Plan Total Prescribed Dose: 16 Gy
Reference Point Dosage Given to Date: 4 Gy
Reference Point Session Dosage Given: 4 Gy
Session Number: 1

## 2022-01-14 ENCOUNTER — Ambulatory Visit
Admission: RE | Admit: 2022-01-14 | Discharge: 2022-01-14 | Disposition: A | Discharge status: Home / Self Care | Payer: Medicare HMO | Source: Ambulatory Visit | Attending: Radiation Oncology | Admitting: Radiation Oncology

## 2022-01-14 ENCOUNTER — Other Ambulatory Visit: Payer: Self-pay

## 2022-01-14 DIAGNOSIS — Z510 Encounter for antineoplastic radiation therapy: Secondary | ICD-10-CM | POA: Diagnosis not present

## 2022-01-14 LAB — RAD ONC ARIA SESSION SUMMARY
Course Elapsed Days: 3
Plan Fractions Treated to Date: 2
Plan Prescribed Dose Per Fraction: 4 Gy
Plan Total Fractions Prescribed: 4
Plan Total Prescribed Dose: 16 Gy
Reference Point Dosage Given to Date: 8 Gy
Reference Point Session Dosage Given: 4 Gy
Session Number: 2

## 2022-01-15 ENCOUNTER — Ambulatory Visit (INDEPENDENT_AMBULATORY_CARE_PROVIDER_SITE_OTHER): Payer: Medicare HMO | Admitting: Surgical

## 2022-01-15 ENCOUNTER — Ambulatory Visit
Admission: RE | Admit: 2022-01-15 | Discharge: 2022-01-15 | Disposition: A | Discharge status: Home / Self Care | Payer: Medicare HMO | Source: Ambulatory Visit | Attending: Radiation Oncology | Admitting: Radiation Oncology

## 2022-01-15 ENCOUNTER — Ambulatory Visit: Payer: Medicare HMO

## 2022-01-15 ENCOUNTER — Other Ambulatory Visit: Payer: Self-pay

## 2022-01-15 ENCOUNTER — Encounter: Payer: Self-pay | Admitting: Surgical

## 2022-01-15 VITALS — BP 144/84 | HR 78

## 2022-01-15 DIAGNOSIS — L910 Hypertrophic scar: Secondary | ICD-10-CM

## 2022-01-15 DIAGNOSIS — Z510 Encounter for antineoplastic radiation therapy: Secondary | ICD-10-CM | POA: Diagnosis not present

## 2022-01-15 LAB — RAD ONC ARIA SESSION SUMMARY
Course Elapsed Days: 4
Plan Fractions Treated to Date: 3
Plan Prescribed Dose Per Fraction: 4 Gy
Plan Total Fractions Prescribed: 4
Plan Total Prescribed Dose: 16 Gy
Reference Point Dosage Given to Date: 12 Gy
Reference Point Session Dosage Given: 4 Gy
Session Number: 3

## 2022-01-15 NOTE — Progress Notes (Signed)
Patient is a 77 year old male here for follow-up after excision of right face keloid with Dr. Ladona Ridgel on 01/10/2022.  He is 5 days postop.  He is here for suture removal of the Prolene sutures.  He is currently undergoing radiation therapy.  He reports he will have a total of 5 treatments, he has had a few treatments already.  He has an additional treatment today.  He reports he is overall doing well, he has not having any issues.  On exam right cheek/face incision is intact, Prolene sutures are noted.  There is no surrounding erythema or cellulitic changes.  Minimal tenderness with palpation.  No active drainage or subcutaneous fluid collection with palpation.  A/P:  Prolene sutures were removed, patient tolerated this well.  Incision is intact and healing well.  He should continue with radiation therapy per radiation oncologist recommendations.  We will plan to follow-up as needed.  Will discuss patient's case with Dr. Ladona Ridgel to see if he would prefer to inject the area with steroids after completion of radiation.  Recommend calling with questions or concerns. Pictures were obtained of the patient and placed in the chart with the patient's or guardian's permission.

## 2022-01-16 ENCOUNTER — Encounter: Payer: Self-pay | Admitting: Radiation Oncology

## 2022-01-16 ENCOUNTER — Other Ambulatory Visit: Payer: Self-pay

## 2022-01-16 ENCOUNTER — Ambulatory Visit
Admission: RE | Admit: 2022-01-16 | Discharge: 2022-01-16 | Disposition: A | Discharge status: Home / Self Care | Payer: Medicare HMO | Source: Ambulatory Visit | Attending: Radiation Oncology | Admitting: Radiation Oncology

## 2022-01-16 DIAGNOSIS — Z510 Encounter for antineoplastic radiation therapy: Secondary | ICD-10-CM | POA: Diagnosis not present

## 2022-01-16 LAB — RAD ONC ARIA SESSION SUMMARY
Course Elapsed Days: 5
Plan Fractions Treated to Date: 4
Plan Prescribed Dose Per Fraction: 4 Gy
Plan Total Fractions Prescribed: 4
Plan Total Prescribed Dose: 16 Gy
Reference Point Dosage Given to Date: 16 Gy
Reference Point Session Dosage Given: 4 Gy
Session Number: 4

## 2022-01-18 ENCOUNTER — Ambulatory Visit: Payer: Medicare HMO

## 2022-01-21 NOTE — Progress Notes (Signed)
                                                                                                                                                             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   Plan: The patient will receive a call in about one month from the radiation oncology department. He will continue follow up with Dr. Ladona Ridgel in Plastic Surgery as well.   ________________________________________________    Osker Mason, PAC

## 2022-01-28 ENCOUNTER — Ambulatory Visit (INDEPENDENT_AMBULATORY_CARE_PROVIDER_SITE_OTHER): Payer: Medicare HMO | Admitting: Physician Assistant

## 2022-01-28 VITALS — BP 129/79 | HR 53

## 2022-01-28 DIAGNOSIS — L910 Hypertrophic scar: Secondary | ICD-10-CM | POA: Diagnosis not present

## 2022-01-28 NOTE — Progress Notes (Signed)
Patient is a 76 year old male with recent keloid excision performed 01/10/2018 by Dr. Ladona Ridgel who had Prolene sutures removed 01/15/2022 who presents today with concerns for wound dehiscence.  Reviewed previous note and plan was for continued radiation per his radiation oncologist to help prevent keloid recurrence.  Follow-up as needed given benign exam.  Discussed possible steroid injection after completion of radiation to help mitigate risk of keloid recurrence.  Today, patient reports that a few days ago he noticed dehiscence of his excision site.  He was concerned about the possibility of it getting infected which prompted him to come in today for evaluation.  He is chronically on 4 L supplemental O2 via [place] for COPD.  He denies any worsening pain, redness, swelling, malodor, or fevers.  On exam, 1 x 1 cm superficial incisional wound noted.  No surrounding erythema or concern for infection.  Area is nontender.  No drainage.  Remainder of incision appears closed.  Recommending Vaseline 1-2 times daily followed by Band-Aid.  Advised patient that it will likely result in a widened scar.  Will need to be careful about steroids in the future given the skin may be thin from radiation treatments.  Will have him follow-up in 6 weeks for reevaluation, sooner if needed.  Picture(s) obtained of the patient and placed in the chart were with the patient's or guardian's permission.

## 2022-02-06 ENCOUNTER — Encounter: Payer: Self-pay | Admitting: Plastic Surgery

## 2022-02-06 ENCOUNTER — Ambulatory Visit (INDEPENDENT_AMBULATORY_CARE_PROVIDER_SITE_OTHER): Payer: Medicare HMO | Admitting: Plastic Surgery

## 2022-02-06 VITALS — BP 115/69 | HR 104

## 2022-02-06 DIAGNOSIS — L910 Hypertrophic scar: Secondary | ICD-10-CM

## 2022-02-06 NOTE — Progress Notes (Signed)
Jeremy Landry returns today 3 weeks status post excision of a right facial keloid with postoperative radiation therapy.  He had a small area of separation of the skin repair but this is doing better and has closed.  He denies any pain fever or chills.  Will continue dressing changes and will follow-up with me the beginning of March.  He will return sooner for any concerns.

## 2022-02-25 ENCOUNTER — Ambulatory Visit
Admission: RE | Admit: 2022-02-25 | Discharge: 2022-02-25 | Disposition: A | Discharge status: Home / Self Care | Payer: No Typology Code available for payment source | Source: Ambulatory Visit | Attending: Radiation Oncology | Admitting: Radiation Oncology

## 2022-02-25 NOTE — Progress Notes (Signed)
  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   Symptoms of fatigue have improved since completing therapy.  Symptoms of skin changes have improved since completing therapy.  Symptoms of difficulty swallowing have improved since completing therapy.    The patient has scheduled follow up with his Plastic surgeon Dr. Lovena Le and Dr. Isidore Moos for ongoing surveillance. The patient was encouraged to call if  he  develop concerns or questions regarding radiation.   This concludes the interview.   Leandra Kern, LPN

## 2022-03-11 ENCOUNTER — Ambulatory Visit (INDEPENDENT_AMBULATORY_CARE_PROVIDER_SITE_OTHER): Payer: Medicare HMO | Admitting: Plastic Surgery

## 2022-03-11 VITALS — BP 151/87 | HR 92

## 2022-03-11 DIAGNOSIS — L910 Hypertrophic scar: Secondary | ICD-10-CM

## 2022-03-11 NOTE — Progress Notes (Signed)
[name] returns today after excision of a keloid from his right neck followed by postoperative radiation therapy.  He is doing very well and his only complaints are occasional itching in the incision.  He is happy with the results.  On examination the incision is well-healed and there is no evidence of recurrence of the keloid.  He will return to see me in approximately 6 weeks.  At that time if he is still doing well he will be released and really will return as needed.

## 2022-04-22 ENCOUNTER — Encounter: Payer: Self-pay | Admitting: Plastic Surgery

## 2022-04-22 ENCOUNTER — Ambulatory Visit (INDEPENDENT_AMBULATORY_CARE_PROVIDER_SITE_OTHER): Payer: Medicare HMO | Admitting: Plastic Surgery

## 2022-04-22 VITALS — BP 121/78 | HR 100

## 2022-04-22 DIAGNOSIS — L910 Hypertrophic scar: Secondary | ICD-10-CM

## 2022-04-22 NOTE — Progress Notes (Signed)
[name] returns today after excision of keloid from the right mandibular region.  He has not had any further issues and is happy with the overall results.  On examination there is a very small scar at the surgical site.  There is no evidence of recurrence of the keloid.  I have encouraged him to return for any concerns.  If there is evidence of the keloid returning we will inject the area with steroids to slow the growth.

## 2022-07-14 ENCOUNTER — Other Ambulatory Visit: Payer: Self-pay

## 2022-07-14 ENCOUNTER — Emergency Department (HOSPITAL_COMMUNITY): Payer: Medicare HMO

## 2022-07-14 ENCOUNTER — Emergency Department (HOSPITAL_COMMUNITY)
Admission: EM | Admit: 2022-07-14 | Discharge: 2022-07-14 | Disposition: A | Discharge status: Home / Self Care | Payer: Medicare HMO | Attending: Emergency Medicine | Admitting: Emergency Medicine

## 2022-07-14 ENCOUNTER — Encounter (HOSPITAL_COMMUNITY): Payer: Self-pay

## 2022-07-14 DIAGNOSIS — S8992XA Unspecified injury of left lower leg, initial encounter: Secondary | ICD-10-CM | POA: Diagnosis present

## 2022-07-14 DIAGNOSIS — Z23 Encounter for immunization: Secondary | ICD-10-CM | POA: Diagnosis not present

## 2022-07-14 DIAGNOSIS — J449 Chronic obstructive pulmonary disease, unspecified: Secondary | ICD-10-CM | POA: Insufficient documentation

## 2022-07-14 DIAGNOSIS — I110 Hypertensive heart disease with heart failure: Secondary | ICD-10-CM | POA: Diagnosis not present

## 2022-07-14 DIAGNOSIS — Z91010 Allergy to peanuts: Secondary | ICD-10-CM | POA: Insufficient documentation

## 2022-07-14 DIAGNOSIS — I509 Heart failure, unspecified: Secondary | ICD-10-CM | POA: Diagnosis not present

## 2022-07-14 DIAGNOSIS — R1011 Right upper quadrant pain: Secondary | ICD-10-CM | POA: Diagnosis not present

## 2022-07-14 DIAGNOSIS — W109XXA Fall (on) (from) unspecified stairs and steps, initial encounter: Secondary | ICD-10-CM | POA: Insufficient documentation

## 2022-07-14 DIAGNOSIS — Z7901 Long term (current) use of anticoagulants: Secondary | ICD-10-CM | POA: Diagnosis not present

## 2022-07-14 DIAGNOSIS — W19XXXA Unspecified fall, initial encounter: Secondary | ICD-10-CM

## 2022-07-14 DIAGNOSIS — Z85118 Personal history of other malignant neoplasm of bronchus and lung: Secondary | ICD-10-CM | POA: Insufficient documentation

## 2022-07-14 DIAGNOSIS — D649 Anemia, unspecified: Secondary | ICD-10-CM | POA: Diagnosis not present

## 2022-07-14 DIAGNOSIS — S80212A Abrasion, left knee, initial encounter: Secondary | ICD-10-CM | POA: Diagnosis not present

## 2022-07-14 DIAGNOSIS — R0789 Other chest pain: Secondary | ICD-10-CM

## 2022-07-14 LAB — CBC WITH DIFFERENTIAL/PLATELET
Abs Immature Granulocytes: 0.02 10*3/uL (ref 0.00–0.07)
Basophils Absolute: 0 10*3/uL (ref 0.0–0.1)
Basophils Relative: 0 %
Eosinophils Absolute: 0.2 10*3/uL (ref 0.0–0.5)
Eosinophils Relative: 2 %
HCT: 30.3 % — ABNORMAL LOW (ref 39.0–52.0)
Hemoglobin: 8.8 g/dL — ABNORMAL LOW (ref 13.0–17.0)
Immature Granulocytes: 0 %
Lymphocytes Relative: 13 %
Lymphs Abs: 1 10*3/uL (ref 0.7–4.0)
MCH: 25.1 pg — ABNORMAL LOW (ref 26.0–34.0)
MCHC: 29 g/dL — ABNORMAL LOW (ref 30.0–36.0)
MCV: 86.6 fL (ref 80.0–100.0)
Monocytes Absolute: 0.7 10*3/uL (ref 0.1–1.0)
Monocytes Relative: 9 %
Neutro Abs: 5.5 10*3/uL (ref 1.7–7.7)
Neutrophils Relative %: 76 %
Platelets: 289 10*3/uL (ref 150–400)
RBC: 3.5 MIL/uL — ABNORMAL LOW (ref 4.22–5.81)
RDW: 17.5 % — ABNORMAL HIGH (ref 11.5–15.5)
WBC: 7.3 10*3/uL (ref 4.0–10.5)
nRBC: 0 % (ref 0.0–0.2)

## 2022-07-14 LAB — BASIC METABOLIC PANEL
Anion gap: 7 (ref 5–15)
BUN: 14 mg/dL (ref 8–23)
CO2: 23 mmol/L (ref 22–32)
Calcium: 9.1 mg/dL (ref 8.9–10.3)
Chloride: 106 mmol/L (ref 98–111)
Creatinine, Ser: 0.91 mg/dL (ref 0.61–1.24)
GFR, Estimated: >60 mL/min (ref >60)
Glucose, Bld: 109 mg/dL — ABNORMAL HIGH (ref 70–99)
Potassium: 4.2 mmol/L (ref 3.5–5.1)
Sodium: 136 mmol/L (ref 135–145)

## 2022-07-14 MED ORDER — HYDROCODONE-ACETAMINOPHEN 5-325 MG PO TABS: 1.0000 | ORAL_TABLET | Freq: Four times a day (QID) | ORAL | 0 refills | Status: AC | PRN

## 2022-07-14 MED ORDER — IOHEXOL 350 MG/ML SOLN
50.0000 mL | Freq: Once | INTRAVENOUS | Status: AC | PRN
  Administered 2022-07-14: 50 mL via INTRAVENOUS

## 2022-07-14 MED ORDER — TETANUS-DIPHTH-ACELL PERTUSSIS 5-2.5-18.5 LF-MCG/0.5 IM SUSY
0.5000 mL | PREFILLED_SYRINGE | Freq: Once | INTRAMUSCULAR | Status: AC
  Administered 2022-07-14: 0.5 mL via INTRAMUSCULAR
  Filled 2022-07-14: qty 0.5

## 2022-07-14 MED ORDER — HYDROCODONE-ACETAMINOPHEN 5-325 MG PO TABS
1.0000 | ORAL_TABLET | Freq: Once | ORAL | Status: AC
  Administered 2022-07-14: 1 via ORAL
  Filled 2022-07-14: qty 1

## 2022-07-14 MED ORDER — MORPHINE SULFATE (PF) 4 MG/ML IV SOLN
4.0000 mg | Freq: Once | INTRAVENOUS | Status: AC
  Administered 2022-07-14: 4 mg via INTRAVENOUS
  Filled 2022-07-14: qty 1

## 2022-07-14 MED ORDER — IOHEXOL 350 MG/ML SOLN
100.0000 mL | Freq: Once | INTRAVENOUS | Status: AC | PRN
  Administered 2022-07-14: 100 mL via INTRAVENOUS

## 2022-07-14 NOTE — ED Triage Notes (Signed)
Pt BIB EMS due to mechanical fall. Pt fell going upstairs and landed on left flank. C/O rib pain. Pt denies hitting head but states LOC. Axox4. Pt on plavix.

## 2022-07-14 NOTE — Discharge Instructions (Addendum)
You have been evaluated for your injury.  Fortunately CT scan today did not show any significant signs of injury.  Your pain is likely due to a bone bruise.  Take pain medication as prescribed but be aware it can cause drowsiness.  Your blood count is lower than usual.  Your hemoglobin is 8.8  It was 12.3 approximately 4 years ago.  Follow-up closely with your doctor for recheck of your labs and for further evaluation.

## 2022-07-14 NOTE — ED Provider Notes (Signed)
[place] EMERGENCY DEPARTMENT AT [facility] Provider Note   CSN: [id number] Arrival date & time: 07/14/22  1437     History  No chief complaint on file.   Jeremy Landry is a 77 y.o. male.  The history is provided by the patient and medical records. No language interpreter was used.     77 year old male with multiple comorbidities which includes lung cancer and currently on 4 L of supplemental oxygen, COPD, hypertension, CHF, GERD, chronic headache, prior stroke currently on Plavix who was brought here via EMS from home for evaluation of a fall.  Patient reports earlier today he was trying to get out to his car to roll up the window due to the rain.  Patient normally wears supplemental oxygen and states he is only able to walk short distance before he gets out of breath.  He did remove his oxygen in order to rush out to his car but in the process his leg gave out on him and he fell down to the ground.  He struck the left side of his chest against the ground as well his left knee.  He did not hit his head and denies any loss of consciousness.  He denies any precipitating symptoms prior to the fall.  He was on the ground for few minutes trying to catch his breath and subsequently was able to get up on his own to walk back into the house.  At this time he endorsed 10 out of 10 pain to the left side of his chest and felt like he is having a broken rib.  He denies any specific treatment tried prior to the arrival.  Home Medications Prior to Admission medications   Medication Sig Start Date End Date Taking? Authorizing Provider  albuterol (VENTOLIN HFA) 108 (90 Base) MCG/ACT inhaler Inhale 2 puffs into the lungs every 6 (six) hours as needed for wheezing or shortness of breath.    [provider]  atorvastatin (LIPITOR) 40 MG tablet Take 40 mg by mouth daily.    [provider]  brimonidine (ALPHAGAN P) 0.1 % SOLN Place 1 drop into both eyes 2 (two) times daily.     [provider]  carboxymethylcellulose (REFRESH PLUS) 0.5 % SOLN Place 1 drop into both eyes 3 (three) times daily as needed (dry eyes).    [provider]  Cholecalciferol 50 MCG (2000 UT) TABS Take 2,000 Units by mouth daily.    [provider]  clopidogrel (PLAVIX) 75 MG tablet Take 75 mg by mouth daily.    [provider]  escitalopram (LEXAPRO) 10 MG tablet Take 10 mg by mouth daily.    [provider]  finasteride (PROSCAR) 5 MG tablet Take 5 mg by mouth daily.    [provider]  HYDROcodone-acetaminophen (NORCO) 5-325 MG tablet Take 1 tablet by mouth every 6 (six) hours as needed for moderate pain. 01/19/20   Allena Napoleon, MD  HYDROcodone-acetaminophen (NORCO) 5-325 MG tablet Take 1 tablet by mouth every 6 (six) hours as needed for moderate pain. 12/06/20   Allena Napoleon, MD  nitroGLYCERIN (NITROSTAT) 0.4 MG SL tablet Place 0.4 mg under the tongue every 5 (five) minutes as needed for chest pain.    [provider]  omeprazole (PRILOSEC) 20 MG capsule Take 20 mg by mouth daily.    [provider]  oxyCODONE (OXY IR/ROXICODONE) 5 MG immediate release tablet Take 1 tablet (5 mg total) by mouth every 6 (  six) hours as needed for severe pain. 10/28/18   Manus Rudd, MD  OXYGEN Inhale 3-4 L/min into the lungs as needed (short of breath).    [provider]  prazosin (MINIPRESS) 1 MG capsule Take 1 mg by mouth at bedtime.    [provider]  terazosin (HYTRIN) 5 MG capsule Take 5 mg by mouth at bedtime.    [provider]  traZODone (DESYREL) 50 MG tablet Take 50 mg by mouth at bedtime as needed for sleep.    [provider]      Allergies    Aspirin, Shellfish allergy, Other, Peanut-containing drug products, and Lisinopril    Review of Systems   Review of Systems  All other systems reviewed and are negative.   Physical Exam Updated Vital Signs BP (!) 152/82 (BP Location:  Left Arm)   Pulse 78   Temp 97.7 F (36.5 C) (Oral)   Resp (!) 22   Ht 6\' 2"  (1.88 m)   Wt 127.9 kg   SpO2 98%   BMI 36.21 kg/m  Physical Exam Vitals and nursing note reviewed.  Constitutional:      Appearance: He is well-developed. He is obese.     Comments: Elderly male wearing supplemental oxygen, holding the left side of his chest appears to be in some discomfort.  HENT:     Head: Normocephalic and atraumatic.  Eyes:     Conjunctiva/sclera: Conjunctivae normal.  Neck:     Comments: No cervical midline spine tenderness Cardiovascular:     Rate and Rhythm: Normal rate and regular rhythm.     Pulses: Normal pulses.     Heart sounds: Normal heart sounds.  Pulmonary:     Comments: Mild tachypnea, faint adventitious breath sounds but no wheezes rales or rhonchi heard. Chest:     Chest wall: Tenderness (Exquisite tenderness noted to left anterolateral chest wall with any crepitus or emphysema noted.  No bruising noted.) present.  Abdominal:     Tenderness: There is abdominal tenderness (Tenderness to right upper quadrant on palpation no bruising noted.).  Musculoskeletal:        General: Signs of injury (Left knee: Small abrasion noted to the anterior knee with normal external extension) present.     Cervical back: Normal range of motion and neck supple.  Skin:    Findings: No rash.  Neurological:     Mental Status: He is alert. Mental status is at baseline.     ED Results / Procedures / Treatments   Labs (all labs ordered are listed, but only abnormal results are displayed) Labs Reviewed  CBC WITH DIFFERENTIAL/PLATELET - Abnormal; Notable for the following components:      Result Value   RBC 3.50 (*)    Hemoglobin 8.8 (*)    HCT 30.3 (*)    MCH 25.1 (*)    MCHC 29.0 (*)    RDW 17.5 (*)    All other components within normal limits  BASIC METABOLIC PANEL - Abnormal; Notable for the following components:   Glucose, Bld 109 (*)    All other components within normal  limits    EKG None  Radiology CT CHEST ABDOMEN PELVIS W CONTRAST  Result Date: 07/14/2022 CLINICAL DATA:  Trauma history of lung cancer * Tracking Code: BO * EXAM: CT CHEST, ABDOMEN, AND PELVIS WITH CONTRAST TECHNIQUE: Multidetector CT imaging of the chest, abdomen and pelvis was performed following the standard protocol during bolus administration of intravenous contrast. RADIATION DOSE REDUCTION: This exam  was performed according to the departmental dose-optimization program which includes automated exposure control, adjustment of the mA and/or kV according to patient size and/or use of iterative reconstruction technique. CONTRAST:  50mL OMNIPAQUE IOHEXOL 350 MG/ML SOLN COMPARISON:  Outside CT chest, 10/02/2021 FINDINGS: CT CHEST FINDINGS Cardiovascular: Aortic atherosclerosis. Cardiomegaly. Left coronary artery calcifications. Enlargement of the main pulmonary artery measuring up to 3.9 cm in caliber. No pericardial effusion. Mediastinum/Nodes: No enlarged mediastinal, hilar, or axillary lymph nodes. Thyroid gland, trachea, and esophagus demonstrate no significant findings. Lungs/Pleura: Moderate centrilobular emphysema. Diffuse bilateral bronchial wall thickening. Bandlike post treatment/post radiation scarring or atelectasis of the right lung base, unchanged compared to prior examination. Unchanged 0.5 cm nodule of the peripheral left lower lobe, benign, requiring no further follow-up or characterization (series 13, image 96). No pleural effusion or pneumothorax. Musculoskeletal: No chest wall abnormality. No acute osseous findings. CT ABDOMEN PELVIS FINDINGS Hepatobiliary: No solid liver abnormality is seen. Trace sludge or gravel in the dependent gallbladder (series 3, image 31). No gallbladder wall thickening, or biliary dilatation. Pancreas: Unremarkable. No pancreatic ductal dilatation or surrounding inflammatory changes. Spleen: Normal in size without significant abnormality. Adrenals/Urinary  Tract: Adrenal glands are unremarkable. Large, exophytic cortical cyst of the superior pole of the left kidney measuring 9.2 x 8.2 cm, benign, requiring no specific further follow-up or characterization. Small nonobstructive calculus of the posterior midportion of the left kidney. No right-sided calculi, ureteral calculi, or hydronephrosis. Diffuse thickening of the decompressed urinary bladder wall, likely secondary to chronic outlet obstruction. Stomach/Bowel: Stomach is within normal limits. Appendix appears normal. No evidence of bowel wall thickening, distention, or inflammatory changes. Vascular/Lymphatic: Aortic atherosclerosis. No enlarged abdominal or pelvic lymph nodes. Reproductive: No mass or other abnormality. Other: No abdominal wall hernia or abnormality. No ascites. Musculoskeletal: No acute osseous findings. IMPRESSION: 1. No acute CT findings of the chest, abdomen, or pelvis. 2. Bandlike post treatment/post radiation scarring or atelectasis of the right lung base, unchanged compared to prior examination. 3. No evidence of recurrent or metastatic disease in the chest, abdomen, or pelvis. 4. Emphysema and diffuse bilateral bronchial wall thickening. 5. Enlargement of the main pulmonary artery, as can be seen in pulmonary hypertension. 6. Coronary artery disease. Aortic Atherosclerosis (ICD10-I70.0) and Emphysema (ICD10-J43.9). Electronically Signed   By: Jearld Lesch M.D.   On: 07/14/2022 19:30    Procedures Procedures    Medications Ordered in ED Medications  HYDROcodone-acetaminophen (NORCO/VICODIN) 5-325 MG per tablet 1 tablet (has no administration in time range)  morphine (PF) 4 MG/ML injection 4 mg (4 mg Intravenous Given 07/14/22 1620)  Tdap (BOOSTRIX) injection 0.5 mL (0.5 mLs Intramuscular Given 07/14/22 1620)  iohexol (OMNIPAQUE) 350 MG/ML injection 100 mL (100 mLs Intravenous Contrast Given 07/14/22 1800)  iohexol (OMNIPAQUE) 350 MG/ML injection 50 mL (50 mLs Intravenous Contrast  Given 07/14/22 1858)    ED Course/ Medical Decision Making/ A&P Clinical Course as of 07/14/22 2040  Sun Jul 14, 2022  1946 77 YOM with GLF from not wearing O2   [CC]    Clinical Course User Index [CC] Glyn Ade, MD                             Medical Decision Making Amount and/or Complexity of Data Reviewed Labs: ordered. Radiology: ordered. ECG/medicine tests: ordered.  Risk Prescription drug management.   Ht 6\' 2"  (1.88 m)   Wt 127.9 kg   BMI 36.21 kg/m   20:7 PM 77 year old  male with multiple comorbidities which includes lung cancer and currently on 4 L of supplemental oxygen, COPD, hypertension, CHF, GERD, chronic headache, prior stroke currently on Plavix who was brought here via EMS from home for evaluation of a fall.  Patient reports earlier today he was trying to get out to his car to roll up the window due to the rain.  Patient normally wears supplemental oxygen and states he is only able to walk short distance before he gets out of breath.  He did remove his oxygen in order to rush out to his car but in the process his leg gave out on him and he fell down to the ground.  He struck the left side of his chest against the ground as well his left knee.  He did not hit his head and denies any loss of consciousness.  He denies any precipitating symptoms prior to the fall.  He was on the ground for few minutes trying to catch his breath and subsequently was able to get up on his own to walk back into the house.  At this time he endorsed 10 out of 10 pain to the left side of his chest and felt like he is having a broken rib.  He denies any specific treatment tried prior to the arrival.  On exam, elderly male laying in bed appears uncomfortable.  He is holding the left side of his chest.  He does not have any significant signs of midline spine tenderness head trauma but he does have tenderness to his left lateral chest wall and his left upper abdomen without any bruising or  deformity.  His left knee there is an abrasion there but he is able to flex and extend the knee without difficulty.  His hip is stable.  Patient is currently wearing supplemental oxygen at 4 L which is his baseline.  -Labs ordered, independently viewed and interpreted by me.  Labs remarkable for Hgb 8.8.  it was 12.3 approx 4 years ago. Recommend out pt f/u and lab recheck.   -The patient was maintained on a cardiac monitor.  I personally viewed and interpreted the cardiac monitored which showed an underlying rhythm of: NSR -Imaging independently viewed and interpreted by me and I agree with radiologist's interpretation.  Result remarkable for chest/abd/pelvis CT showing no acute finding -This patient presents to the ED for concern of fall, this involves an extensive number of treatment options, and is a complaint that carries with it a high risk of complications and morbidity.  The differential diagnosis includes mechanical fall, contusion, fx, strain, sprain, dislocation, internal injury, perforation -Co morbidities that complicate the patient evaluation includes HTN, COPD, alcoholism, lung CA, stroke -Treatment includes morphine, vicodin, tdap -Reevaluation of the patient after these medicines showed that the patient improved -PCP office notes or outside notes reviewed -Discussion with specialist attending Dr. Doran Durand -Escalation to admission/observation considered: patients feels much better, is comfortable with discharge, and will follow up with PCP -Prescription medication considered, patient comfortable with vicodin -Social Determinant of Health considered which includes tobacco use          Final Clinical Impression(s) / ED Diagnoses Final diagnoses:  Fall at home, initial encounter  Chest wall pain  Anemia, unspecified type    Rx / DC Orders ED Discharge Orders          Ordered    HYDROcodone-acetaminophen (NORCO) 5-325 MG tablet  Every 6 hours PRN        07/14/22 2038  Fayrene Helper, PA-C 07/14/22 2042    Glyn Ade, MD 07/14/22 2241

## 2022-09-18 ENCOUNTER — Encounter: Payer: Self-pay | Admitting: Internal Medicine

## 2022-09-18 ENCOUNTER — Ambulatory Visit (INDEPENDENT_AMBULATORY_CARE_PROVIDER_SITE_OTHER): Payer: Medicare HMO | Admitting: Internal Medicine

## 2022-09-18 VITALS — BP 140/90 | HR 72 | Ht 74.0 in | Wt 280.6 lb

## 2022-09-18 DIAGNOSIS — J4489 Other specified chronic obstructive pulmonary disease: Secondary | ICD-10-CM

## 2022-09-18 DIAGNOSIS — Z23 Encounter for immunization: Secondary | ICD-10-CM

## 2022-09-18 DIAGNOSIS — J439 Emphysema, unspecified: Secondary | ICD-10-CM | POA: Diagnosis not present

## 2022-09-18 DIAGNOSIS — J9611 Chronic respiratory failure with hypoxia: Secondary | ICD-10-CM | POA: Diagnosis not present

## 2022-09-18 MED ORDER — SPIRIVA RESPIMAT 2.5 MCG/ACT IN AERS: 2.0000 | INHALATION_SPRAY | Freq: Every day | RESPIRATORY_TRACT | 11 refills | Status: DC

## 2022-09-18 MED ORDER — FLUTICASONE-SALMETEROL 500-50 MCG/ACT IN AEPB
1.0000 | INHALATION_SPRAY | Freq: Two times a day (BID) | RESPIRATORY_TRACT | 11 refills | Status: DC
Start: 2022-09-18 — End: 2022-12-12

## 2022-09-18 NOTE — Patient Instructions (Addendum)
It was a pleasure to see you today!  Please schedule follow up scheduled with myself in 3 months.  If my schedule is not open yet, we will contact you with a reminder closer to that time. Please call [phone] if you haven't heard from [place] a month before, and always call us sooner if issues or concerns arise. You can also send [place] a message through MyChart, but but aware that this is not to be used for urgent issues and it may take up to 5-7 days to receive a reply. Please be aware that you will likely be able to view your results before I have a chance to respond to them. Please give [place] 5 business days to respond to any non-urgent results.  It was a pleasure to see you today!  Before your next visit I would like you to have:  Stop striverdi. Switch to wixela 1 puff twice a day, and spiriva 2 puffs once daily. [facility] [place].   Please keep your oxygen on at all times!  We did your flu shot today. I Recommend you get the RSV vaccine and covid vaccine at your pharmacy or the [place].

## 2022-09-18 NOTE — Addendum Note (Signed)
Addended by: Carnella Guadalajara on: 09/18/2022 05:05 PM   Modules accepted: Orders

## 2022-09-18 NOTE — Progress Notes (Signed)
Jeremy Landry    [id number]    [birth year]  Primary Care Physician:Smith, Marlynn Perking., FNP Date of Appointment: 09/18/2022 Established Patient Visit  Chief complaint:   Chief Complaint  Patient presents with   Follow-up    Follow Up visit. Pt complains of SOB.     HPI: Jeremy Landry  is a 77 y.o.a man who follows at the [place]. 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   Interval Updates: Here for follow up.   No interval exacerbations or flares of COPD. Still on 4L [place] requiring continuous O2.  Currently on striverdi 2 puffs daily with albuterol about 3 times/day.   Had a fall July 7th when he wasn't wearing his oxygen and passed out.   He saw his primary are who suggested changing his inhalers, repeating PFTs, and following up with me.   He has done pulmonary rehab before in 2022. Didn't feel like he had a dramatic improvement in his breathing.  He has only portable tanks, no POC.   No fevers, chills, night sweats or weight loss.   I have reviewed the patient's family social and past medical history and updated as appropriate.   Past Medical History:  Diagnosis Date   Alcoholism (HCC)    Anxiety    Arthritis    Chronic headaches    Congestive heart failure (HCC)    COPD (chronic obstructive pulmonary disease) (HCC)    Depression    GERD (gastroesophageal reflux disease)    Glaucoma    History of kidney stones    Hypertension    Kidney stones    Lung cancer (HCC)    Pneumonia    Prostate cancer (HCC)    Stroke (HCC)    " small stroke one time "   Wears glasses    Wears partial dentures     Past Surgical History:  Procedure Laterality Date   CERVICAL DISC SURGERY     COLONOSCOPY WITH PROPOFOL N/A 09/05/2015   Procedure: COLONOSCOPY WITH PROPOFOL;  Surgeon: Sherrilyn Rist, MD;  Location: [facility];  Service: Gastroenterology;  Laterality: N/A;   LIPOMA  EXCISION Left 10/28/2018   Procedure: EXCISION SUBCUTANEOUS LIPOMA LEFT SHOULDER;  Surgeon: Manus Rudd, MD;  Location: [facility];  Service: General;  Laterality: Left;  LMA   LUMBAR DISC SURGERY     x 3   RADIOLOGY WITH ANESTHESIA N/A 02/06/2017   Procedure: MRI OF THE BRAIN WITH AND WITHOUT CONTRAST;  Surgeon: Radiologist, Medication, MD;  Location: [facility];  Service: Radiology;  Laterality: N/A;    Family History  Problem Relation Age of Onset   Bone cancer Brother        in leg   Colon cancer Maternal Uncle     Social History   Occupational History   Occupation: retired  Tobacco Use   Smoking status: Former    Current packs/day: 0.00    Average packs/day: 1 pack/day for 40.0 years (40.0 ttl pk-yrs)    Types: Cigarettes    Start date: 07/23/1974    Quit date: 07/23/2014    Years since quitting: 8.1   Smokeless tobacco: Never  Vaping Use   Vaping status: Former   Quit date: 12/07/2016  Substance and Sexual Activity   Alcohol use: Yes    Alcohol/week: 0.0 standard drinks of alcohol  Comment: ocassional beer once or twice a week   Drug use: No    Comment: 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  Gen:      No acute distress, on nasal cannula ENT:  no nasal polyps, mucus membranes moist Lungs:    No increased respiratory effort, symmetric chest wall excursion, clear to auscultation bilaterally, diminished, no wheezes CV:         Regular rate and rhythm; no murmurs, rubs, or gallops.  No pedal edema   Data Reviewed: Imaging: Reviewed the report from CT Chest Sept 2023 -  Similar postradiation changes in the right lung. No evidence of  recurrent or metastatic disease in the chest.   PFTs:     Latest Ref Rng & Units 08/08/2021    9:21 AM  PFT Results  FVC-Pre L 2.37   FVC-Predicted Pre % 48   FVC-Post L 2.64    FVC-Predicted Post % 54   Pre FEV1/FVC % % 65   Post FEV1/FCV % % 65   FEV1-Pre L 1.53   FEV1-Predicted Pre % 43   FEV1-Post L 1.72   DLCO uncorrected ml/min/mmHg 11.03   DLCO UNC% % 39   DLCO corrected ml/min/mmHg 11.03   DLCO COR %Predicted % 39   DLVA Predicted % 56   TLC L 5.98   TLC % Predicted % 76   RV % Predicted % 120    I have personally reviewed the patient's PFTs and severe airflow limitation with significant response to BD and reduced DLCO.   Labs:  Immunization status: Immunization History  Administered Date(s) Administered   Fluad Quad(high Dose 65+) 11/16/2020, 11/07/2021   H1N1 03/21/2008   Influenza Split 11/18/2015   Influenza, High Dose Seasonal PF 10/07/2016, 09/30/2017   Influenza, Seasonal, Injecte, Preservative Fre 12/29/2008, 09/27/2009, 12/24/2010, 09/25/2012, 10/04/2013   Influenza-Unspecified 11/25/2000, 10/26/2001, 01/04/2005, 12/03/2005, 11/21/2006, 10/12/2007, 10/04/2016, 09/30/2017, 09/08/2018, 10/08/2019   Moderna Covid-19 Vaccine Bivalent Booster 60yrs & up 12/01/2020   Moderna Sars-Covid-2 Vaccination 03/08/2019   PFIZER(Purple Top)SARS-COV-2 Vaccination 03/05/2019, 03/30/2019, 09/28/2019, 06/12/2020   PNEUMOCOCCAL CONJUGATE-20 06/22/2021   Pneumococcal Conjugate-13 09/07/2015, 10/09/2016   Pneumococcal Polysaccharide-23 11/25/2000, 12/03/2005, 09/07/2015   Tdap 09/07/2015, 07/14/2022    External Records Personally Reviewed:   Assessment:  COPD Gold Stage 4 FEV1 43% of predicted Stage 1B lung cancer 2019 treated by SBRT only   Plan/Recommendations: Stop striverdi. Switch to wixela 1 puff twice a day, and spiriva 2 puffs once daily. [facility] [place]   Please keep your oxygen on at all times!  We did your flu shot today. I Recommend you get the RSV vaccine and covid vaccine at your pharmacy or the [place].   Continue lung cancer surveillance at the [place].   We did do ambulatory desat study today to  see if he could have a POC instead of small tanks. He was not able to tolerate POC and requires continuous flow oxygen.   Return to Care: Return in about 3 months (around 12/18/2022).   Durel Salts, MD Pulmonary and Critical Care Medicine [facility] Office:[phone]

## 2022-09-19 ENCOUNTER — Telehealth: Payer: Self-pay | Admitting: Internal Medicine

## 2022-09-19 NOTE — Telephone Encounter (Signed)
Patient states Spiriva and Wixela inhales need to be sent to another pharmacy. Pharmacy is [facility]. Patient phone number is [phone].

## 2022-09-20 NOTE — Telephone Encounter (Signed)
Inhalers have been refilled for pt.

## 2022-12-12 ENCOUNTER — Ambulatory Visit: Payer: Medicare HMO | Admitting: Internal Medicine

## 2022-12-12 ENCOUNTER — Encounter: Payer: Self-pay | Admitting: Internal Medicine

## 2022-12-12 VITALS — BP 130/78 | HR 80 | Ht 74.0 in | Wt 282.0 lb

## 2022-12-12 DIAGNOSIS — J9611 Chronic respiratory failure with hypoxia: Secondary | ICD-10-CM

## 2022-12-12 DIAGNOSIS — C3432 Malignant neoplasm of lower lobe, left bronchus or lung: Secondary | ICD-10-CM

## 2022-12-12 DIAGNOSIS — J439 Emphysema, unspecified: Secondary | ICD-10-CM | POA: Diagnosis not present

## 2022-12-12 DIAGNOSIS — J4489 Other specified chronic obstructive pulmonary disease: Secondary | ICD-10-CM | POA: Diagnosis not present

## 2022-12-12 MED ORDER — FLUTICASONE-SALMETEROL 500-50 MCG/ACT IN AEPB: 1.0000 | INHALATION_SPRAY | Freq: Two times a day (BID) | RESPIRATORY_TRACT | 11 refills | Status: AC

## 2022-12-12 MED ORDER — SPIRIVA RESPIMAT 2.5 MCG/ACT IN AERS: 2.0000 | INHALATION_SPRAY | Freq: Every day | RESPIRATORY_TRACT | 11 refills | Status: AC

## 2022-12-12 NOTE — Patient Instructions (Addendum)
It was a pleasure to see you today!  Please schedule follow up scheduled with myself in 6 months.  If my schedule is not open yet, we will contact you with a reminder closer to that time. Please call [phone] if you haven't heard from [place] a month before, and always call us sooner if issues or concerns arise. You can also send [place] a message through MyChart, but but aware that this is not to be used for urgent issues and it may take up to 5-7 days to receive a reply. Please be aware that you will likely be able to view your results before I have a chance to respond to them. Please give [place] 5 business days to respond to any non-urgent results.    [facility]  Wixela 1 puff twice daily Spiriva 2 puffs once daily.   Continue albuterol inhaler as needed.   Continue lung cancer surveillance at the [place].

## 2022-12-12 NOTE — Progress Notes (Signed)
Jeremy Landry    [id number]    1945-11-22  Primary Care Physician:Smith, Marlynn Perking., FNP Date of Appointment: 12/12/2022 Established Patient Visit  Chief complaint:   Chief Complaint  Patient presents with   Follow-up    COPD. No changes in breathing, pt is using his  O2 at 2-4L      HPI: Jeremy Landry  is a 77 y.o.a man who follows at the [place]. 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   Interval Updates: Here for COPD follow up.  Discussed the use of AI scribe software for clinical note transcription with the patient, who gave verbal consent to proceed.  History of Present Illness   Jeremy Landry, a patient with end-stage severe COPD, early-stage lung cancer, and arthritis, reports no significant change in his breathing since the last visit. The patient has been managing his COPD with Spiriva and Wixela inhalers, but expresses uncertainty about their effectiveness. He reports that his breathing does not seem to improve with the inhalers and sometimes feels he is not helping at all.  The patient has experienced falls due to his leg 'giving away,' which he attributes to his arthritis. 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 He is currently experiencing similar symptoms, suggesting possible nerve involvement again.  Jeremy Landry also reports significant sleep disturbances. He has not had a good night's sleep in a long time, often waking up due to difficulty breathing and leg pain. He lives with his brother, who is also unwell.  The patient has a history of radiation therapy for lung cancer in 2019. He is on four liters of oxygen at home and has a pulse oximeter to monitor his oxygen levels, although he admits to not checking it daily. His FEV1 is at 41%, indicating severe lung function  impairment.  Despite his severe condition, Jeremy Landry expresses a desire to maintain his current treatment regimen and to avoid being a burden on others. He has a preference for comfort and quality of life over aggressive life support measures.       I have reviewed the patient's family social and past medical history and updated as appropriate.   Past Medical History:  Diagnosis Date   Alcoholism (HCC)    Anxiety    Arthritis    Chronic headaches    Congestive heart failure (HCC)    COPD (chronic obstructive pulmonary disease) (HCC)    Depression    GERD (gastroesophageal reflux disease)    Glaucoma    History of kidney stones    Hypertension    Kidney stones    Lung cancer (HCC)    Pneumonia    Prostate cancer (HCC)    Stroke (HCC)    " small stroke one time "   Wears glasses    Wears partial dentures     Past Surgical History:  Procedure Laterality Date   CERVICAL DISC SURGERY     COLONOSCOPY WITH PROPOFOL N/A 09/05/2015   Procedure: COLONOSCOPY WITH PROPOFOL;  Surgeon: Sherrilyn Rist, MD;  Location: [facility];  Service: Gastroenterology;  Laterality: N/A;   LIPOMA EXCISION Left 10/28/2018   Procedure: EXCISION SUBCUTANEOUS LIPOMA LEFT SHOULDER;  Surgeon: Manus Rudd, MD;  Location: [facility];  Service: General;  Laterality: Left;  LMA   LUMBAR DISC SURGERY     x 3   RADIOLOGY WITH ANESTHESIA N/A 02/06/2017   Procedure: MRI OF THE BRAIN WITH AND WITHOUT CONTRAST;  Surgeon: Radiologist, Medication, MD;  Location: [facility];  Service: Radiology;  Laterality: N/A;    Family History  Problem Relation Age of Onset   Bone cancer Brother        in leg   Colon cancer Maternal Uncle     Social History   Occupational History   Occupation: retired  Tobacco Use   Smoking status: Former    Current packs/day: 0.00    Average packs/day: 1 pack/day for 40.0 years (40.0 ttl pk-yrs)    Types: Cigarettes    Start date: 07/23/1974    Quit date: 07/23/2014    Years since  quitting: 8.3   Smokeless tobacco: Never  Vaping Use   Vaping status: Former   Quit date: 12/07/2016  Substance and Sexual Activity   Alcohol use: Yes    Alcohol/week: 0.0 standard drinks of alcohol    Comment: ocassional beer once or twice a week   Drug use: No    Comment: 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  Gen:      Chronically ill, on nasal cannula Lungs:    diminished, clear no wheeze CV:         Regular rate and rhythm; no murmurs, rubs, or gallops.  No pedal edema   Data Reviewed: Imaging: Reviewed the report from CT Chest Sept 2023 -  Similar postradiation changes in the right lung. No evidence of  recurrent or metastatic disease in the chest.   PFTs:     Latest Ref Rng & Units 08/08/2021    9:21 AM  PFT Results  FVC-Pre L 2.37   FVC-Predicted Pre % 48   FVC-Post L 2.64   FVC-Predicted Post % 54   Pre FEV1/FVC % % 65   Post FEV1/FCV % % 65   FEV1-Pre L 1.53   FEV1-Predicted Pre % 43   FEV1-Post L 1.72   DLCO uncorrected ml/min/mmHg 11.03   DLCO UNC% % 39   DLCO corrected ml/min/mmHg 11.03   DLCO COR %Predicted % 39   DLVA Predicted % 56   TLC L 5.98   TLC % Predicted % 76   RV % Predicted % 120    I have personally reviewed the patient's PFTs and severe airflow limitation with significant response to BD and reduced DLCO.   Labs:  Immunization status: Immunization History  Administered Date(s) Administered   Fluad Quad(high Dose 65+) 11/16/2020, 11/07/2021   Fluad Trivalent(High Dose 65+) 09/18/2022   H1N1 03/21/2008   Influenza Split 11/18/2015   Influenza, High Dose Seasonal PF 10/07/2016, 09/30/2017   Influenza, Seasonal, Injecte, Preservative Fre 12/29/2008, 09/27/2009, 12/24/2010, 09/25/2012, 10/04/2013   Influenza-Unspecified 11/25/2000, 10/26/2001, 01/04/2005, 12/03/2005, 11/21/2006,  10/12/2007, 10/04/2016, 09/30/2017, 09/08/2018, 10/08/2019   Moderna Covid-19 Vaccine Bivalent Booster 21yrs & up 12/01/2020   Moderna Sars-Covid-2 Vaccination 03/08/2019   PFIZER(Purple Top)SARS-COV-2 Vaccination 03/05/2019, 03/30/2019, 09/28/2019, 06/12/2020   PNEUMOCOCCAL CONJUGATE-20 06/22/2021   Pneumococcal Conjugate-13 09/07/2015, 10/09/2016   Pneumococcal Polysaccharide-23 11/25/2000, 12/03/2005, 09/07/2015   Tdap 09/07/2015, 07/14/2022    External Records Personally Reviewed:   Assessment:  COPD Gold Stage 4 FEV1 43% of predicted Stage 1B  lung cancer 2019 treated by SBRT only Chronic Respiratory failure on home oxygen 4LNC  Plan/Recommendations: Assessment and Plan    End Stage COPD FEV1 41% Stable, but severe symptoms with dyspnea on exertion and poor sleep quality. Patient is on 4L of oxygen and Spiriva. Unclear if patient is taking Wixela. Discussed the severity of the disease and the potential need for advanced care planning. -Continue Spiriva, 2 puffs once daily. - resume wixela.  - offered breztri but he thinks he didn't do well with this in the past -[facility] -Consider discussion of advanced care planning and DNR status at future visits. we broached this topic today. I have recommended DNR/DNI for him to consider.    Chronic respiratory failure on 4LNC - discussed bipap, he has not tolerated this in the past and is not interested  General Health Maintenance -Flu shot already administered for the current season. -Follow-up in 6 months or sooner if needed.     Continue lung cancer surveillance at the [place].   Return to Care: Return in about 6 months (around 06/12/2023).   Durel Salts, MD Pulmonary and Critical Care Medicine [facility] [phone]

## 2023-01-06 ENCOUNTER — Telehealth: Payer: Self-pay | Admitting: Internal Medicine

## 2023-01-06 NOTE — Telephone Encounter (Signed)
PT presents to the front with a bill from [facility] for 10.00 and a 10.00 receipt showing he paid the 10.00 on his visit. I took copies and put on Bailey's desk for follow up when she comes in. PT asked for a call back.

## 2023-01-09 NOTE — Telephone Encounter (Signed)
 Spoke with patient, patients payment on 12/12/2022 was applied to his visit on 12/12/2022. Patient has a $10 balance from his 09/18/2022 visit. Patient is aware and states he will send in payment for this. Nothing further needed.

## 2023-05-21 ENCOUNTER — Ambulatory Visit: Admitting: Plastic Surgery

## 2023-05-21 VITALS — BP 176/73 | HR 73

## 2023-05-21 DIAGNOSIS — L910 Hypertrophic scar: Secondary | ICD-10-CM

## 2023-05-21 NOTE — Progress Notes (Signed)
 Jeremy Landry underwent an excision of a right mandibular keloid with postprocedural radiation therapy in January 2024.  Other than some mild wound breakdown which healed without difficulty he has done well.  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  There is a keloid approximately 1 x 1 cm over the right mandibular margin.  I have recommended to him that he have a steroid injection of the keloid.  After obtaining consent the keloid was prepped with alcohol.  The keloid was injected without difficulty with 0.5 mL of a 50-50 mixture of Kenalog 40 and 1% plain lidocaine .  There were no complications.  We discussed scar massage.  He will return to see me in 4 weeks.

## 2023-06-18 ENCOUNTER — Ambulatory Visit (INDEPENDENT_AMBULATORY_CARE_PROVIDER_SITE_OTHER): Admitting: Plastic Surgery

## 2023-06-18 VITALS — BP 160/88 | HR 85

## 2023-06-18 DIAGNOSIS — L910 Hypertrophic scar: Secondary | ICD-10-CM | POA: Diagnosis not present

## 2023-06-18 NOTE — Progress Notes (Signed)
 Mr. Nohr returns today for evaluation of the keloid on his right mandibular region.  He states he does not think it is gotten any smaller but it appears smaller to be.  I have asked if we could try 1 more round of steroids prior to considering excision.  He is agreeable to this.  Procedure: Injection of the keloid with 0.5 mL of a 50-50 mixture of Kenalog 40 and 1% lidocaine   After obtaining the patient's consent skin was cleansed with alcohol and the keloid was injected without difficulty.  We discussed the importance of continued scar massage.  He will return to see me in 4 to 6 weeks.  If the keloid is not completely resolved will make arrangements for reexcision and closure probably with steroid injections this time.

## 2023-07-30 ENCOUNTER — Ambulatory Visit: Admitting: Plastic Surgery

## 2023-07-30 DIAGNOSIS — L910 Hypertrophic scar: Secondary | ICD-10-CM | POA: Diagnosis not present

## 2023-07-30 NOTE — Progress Notes (Signed)
 Jeremy Landry returns today for evaluation of the keloid over his right mandible.  It is decreased in size significantly but is still present.  At his last appointment we agreed that if it had not resolved completely then I would plan on excising it with injection of steroids.  Will schedule him for excision under local in the office.  Will plan for steroids at the time of the procedure and additional steroids 1 month after the procedure.  All questions were answered to his satisfaction.  Will proceed at his request.  Photographs were obtained today with his consent.

## 2023-09-03 ENCOUNTER — Ambulatory Visit: Admitting: Plastic Surgery

## 2023-09-04 ENCOUNTER — Ambulatory Visit: Admitting: Plastic Surgery

## 2023-09-04 VITALS — BP 146/81 | HR 75

## 2023-09-04 DIAGNOSIS — L910 Hypertrophic scar: Secondary | ICD-10-CM

## 2023-09-04 NOTE — Progress Notes (Signed)
 Procedure Note  Preoperative Dx: Keloid right cheek  Postoperative Dx: Same  Procedure: Excision of keloid with injection of steroids  Anesthesia: Lidocaine  1% with 1:100,000 epinephrine  and 0.25% Sensorcaine    Indication for Procedure: Painful recurrent keloid  Description of Procedure: Risks and complications were explained to the patient including the possibility of recurrence as well as skin thinning from the steroids and hyperpigmentation.  Consent was confirmed and the patient understands the risks and benefits.  The potential complications and alternatives were explained and the patient consents.  The patient expressed understanding the option of not having the procedure and the risks of a scar.  Time out was called and all information was confirmed to be correct.    The area was prepped and drapped.  Local anesthetic was injected in the subcutaneous tissues.  After waiting for the local to take affect an elliptical incision was made around the keloid.  The size of the keloid was approximately 1.5 x 1.5 cm.  The surgical bed was infiltrated with 1 mL of a 50-50 mixture of Kenalog 40 and 1% plain lidocaine .  After obtaining hemostasis, the surgical wound was closed with interrupted 4-0 Monocryl sutures in the dermis and interrupted 5-0 Prolene sutures in the skin.  The surgical wound measured 2.5 cm.  A dressing was applied.  The patient was given instructions on how to care for the area and a follow up appointment.  Jeremy Landry tolerated the procedure well and there were no complications. The specimen was sent to pathology.

## 2023-09-10 ENCOUNTER — Ambulatory Visit: Admitting: Student

## 2023-09-10 LAB — DERMATOLOGY PATHOLOGY

## 2023-09-11 ENCOUNTER — Ambulatory Visit: Admitting: Student

## 2023-09-11 ENCOUNTER — Ambulatory Visit (INDEPENDENT_AMBULATORY_CARE_PROVIDER_SITE_OTHER): Payer: Self-pay | Admitting: Physician Assistant

## 2023-09-11 DIAGNOSIS — L910 Hypertrophic scar: Secondary | ICD-10-CM

## 2023-09-11 NOTE — Progress Notes (Signed)
 Patient is a pleasant 78 year old male s/p excision of right cheek keloid performed 09/04/2023 by Dr. Waddell who returns clinic for postprocedural follow-up.  Reviewed procedural note and the skin edges were closed with 5-0 interrupted Prolene sutures.  Kenalog was injected at time of excision.  Specimen was sent to pathology and consistent with keloid.  Today, patient is doing well from a postprocedural standpoint.  No issues or concerns.  Of note, he states that this area has been excised multiple occasions and been treated with radiation yet continues to experience recurrence.  On exam, skin edges appear well-approximated and excision site is healing nicely.  A total of 7 simple interrupted Prolene sutures are removed in their entirety without complication or difficulty, well-tolerated by patient.  No residual sutures noted.  Patient was notified of the benign pathology.  Discussed case with Dr. Waddell and he would like to see him back for repeat Kenalog injection in 6 weeks.  Discussed silicone scar gel and scar massage in the interim.

## 2023-09-19 ENCOUNTER — Ambulatory Visit (INDEPENDENT_AMBULATORY_CARE_PROVIDER_SITE_OTHER): Payer: Self-pay | Admitting: Student

## 2023-09-19 VITALS — BP 137/78 | HR 82

## 2023-09-19 DIAGNOSIS — L910 Hypertrophic scar: Secondary | ICD-10-CM

## 2023-09-19 NOTE — Progress Notes (Signed)
 Patient is a 78 year old male with history of a keloid to his right cheek.  He recently underwent excision of the keloid with injection of steroids with Dr. Waddell on 09/04/2023 here in the clinic.  He is about 2 weeks out from his procedure.  He presents to the clinic today for further follow-up.  Patient was last seen in the clinic on 09/11/2023.  At this visit, patient was doing well.  On exam, skin edges were well-approximated and the excision site was healing nicely.  7 Prolene sutures were removed without any difficulty or complication.  Today, patient reports he is doing well.  He states that he has felt a few sutures and was wondering if these could be removed.  He denies any other issues or concerns.  He denies any fevers, chills or drainage.  On exam, patient is sitting upright in no acute distress.  Incision to the right cheek appears to be healing well.  There were 2 small Monocryl sutures that were protruding from the skin.  These were cut and removed without any difficulty.  Patient tolerated well.  There is no surrounding erythema, drainage or signs of infection.  Recommended that patient apply Vaseline to the incision for the next few days.  Recommended he gently massage to the incision to help with scarring and to help facilitate suture dissolving.  He expressed understanding.  After few days, patient may return to using scar creams as he has been.  He expressed understanding.  Patient has a follow-up appointment with Dr. Waddell for another keloid injection.  I instructed the patient to call if he has any further questions or concerns about anything.

## 2023-09-29 ENCOUNTER — Encounter: Payer: Self-pay | Admitting: Gastroenterology

## 2023-10-09 ENCOUNTER — Ambulatory Visit: Admitting: Plastic Surgery

## 2023-10-09 VITALS — BP 158/81

## 2023-10-09 DIAGNOSIS — L910 Hypertrophic scar: Secondary | ICD-10-CM

## 2023-10-09 NOTE — Progress Notes (Signed)
 Jeremy Landry returns today for evaluation after excision of keloid from the right mandible.  He is doing well and there is no evidence of recurrence.  The incision is healed well.  He will let me know if there is any evidence of recurrence and will return immediately for steroid injection.  Otherwise he may follow-up as needed.

## 2023-11-20 ENCOUNTER — Encounter: Payer: Self-pay | Admitting: Gastroenterology

## 2023-11-20 ENCOUNTER — Ambulatory Visit (INDEPENDENT_AMBULATORY_CARE_PROVIDER_SITE_OTHER): Admitting: Gastroenterology

## 2023-11-20 VITALS — BP 140/84 | HR 75 | Ht 74.0 in | Wt 276.1 lb

## 2023-11-20 DIAGNOSIS — Z860100 Personal history of colon polyps, unspecified: Secondary | ICD-10-CM | POA: Diagnosis not present

## 2023-11-20 DIAGNOSIS — K5909 Other constipation: Secondary | ICD-10-CM | POA: Diagnosis not present

## 2023-11-20 NOTE — Progress Notes (Signed)
 [facility] Consult Note:  History: Jeremy Landry 11/20/2023  Referring provider: Claudene Prentice DELENA Mickey., FNP  Reason for consult/chief complaint: Colon Cancer Screening (Patient is here to discuss colon screening options due to age. Currently taking Plavix .)   Subjective  Prior history:  Clinic evaluation here 2017 for constipation.  He was also referred from a local health clinic (not the [place]) with limited available records, reportedly anemic.  His hemoglobin was 12.2 with a normal MCV at that time. Colonoscopy at [facility] August 2017 (at age 34): Diminutive sigmoid hyperplastic polyp , diverticulosis, internal hemorrhoids  Now referred to us  by [facility] for surveillance colonoscopy.  He was seen in that clinic September this year and those records were reviewed.  They indicate a history of COPD on continuous supplemental oxygen, history of lung cancer, TIA with ongoing use of Plavix .  Also in those referral notes, it has report from a [facility] and [facility] September 2015 regarding colonoscopy done for screening that revealed left colon hyperplastic polyps.  That was unknown to us  at the time of his 2017 evaluation here. [facility] indicates [facility] had recommended a surveillance colonoscopy years ago, the patient was not sure he wanted to have any more procedures, then expressed desire/willingness to do so, thus the referral to us  now.  Discussed the use of AI scribe software for clinical note transcription with the patient, who gave verbal consent to proceed.  History of Present Illness Jeremy Landry is a 78 year old male with COPD who presents for evaluation of constipation and consideration of a repeat colonoscopy. He was referred by the [facility] clinic for evaluation of the need for a repeat colonoscopy.  Constipation - Chronic constipation, described as persistent and ongoing - Requires daily stool softeners and occasional  use of over-the-counter laxatives, including periodic use of Epsom salts, which are effective - Attempts dietary modifications with increased fresh fruits, vegetables, and fiber despite economic limitations - No hematochezia  Colonoscopy history and colorectal cancer screening - Colonoscopy performed in 2017 due to bowel movement difficulties, revealed a small, non-precancerous polyp - Previous colonoscopy at [facility] in 2015 - No family history of colon cancer  Chronic obstructive pulmonary disease (copd) - COPD requiring continuous oxygen supplementation - Carries multiple inhalers for COPD management - 2017 colonoscopy performed in a hospital setting due to COPD    ROS:  Review of Systems Chronic dyspnea from COPD Denies chest pain Denies dysuria  Past Medical History: Past Medical History:  Diagnosis Date   Alcoholism (HCC)    Anxiety    Arthritis    Chronic headaches    Congestive heart failure (HCC)    COPD (chronic obstructive pulmonary disease) (HCC)    Depression    GERD (gastroesophageal reflux disease)    Glaucoma    History of kidney stones    Hypertension    Kidney stones    Lung cancer (HCC)    Pneumonia    Prostate cancer (HCC)    Stroke (HCC)     small stroke one time    Wears glasses    Wears partial dentures      Past Surgical History: Past Surgical History:  Procedure Laterality Date   CERVICAL DISC SURGERY     COLONOSCOPY WITH PROPOFOL  N/A 09/05/2015   Procedure: COLONOSCOPY WITH PROPOFOL ;  Surgeon: Victory LITTIE Legrand DOUGLAS, MD;  Location: [facility];  Service: Gastroenterology;  Laterality: N/A;  LIPOMA EXCISION Left 10/28/2018   Procedure: EXCISION SUBCUTANEOUS LIPOMA LEFT SHOULDER;  Surgeon: Belinda Cough, MD;  Location: [facility] OR;  Service: General;  Laterality: Left;  LMA   LUMBAR DISC SURGERY     x 3   RADIOLOGY WITH ANESTHESIA N/A 02/06/2017   Procedure: MRI OF THE BRAIN WITH AND WITHOUT CONTRAST;  Surgeon: Radiologist, Medication, MD;   Location: [facility] OR;  Service: Radiology;  Laterality: N/A;     Family History: Family History  Problem Relation Age of Onset   Bone cancer Brother        in leg   Colon cancer Maternal Uncle     Social History: Social History   Socioeconomic History   Marital status: Divorced    Spouse name: Not on file   Number of children: 0   Years of education: Not on file   Highest education level: Not on file  Occupational History   Occupation: retired  Tobacco Use   Smoking status: Former    Current packs/day: 0.00    Average packs/day: 1 pack/day for 40.0 years (40.0 ttl pk-yrs)    Types: Cigarettes    Start date: 07/23/1974    Quit date: 07/23/2014    Years since quitting: 9.3   Smokeless tobacco: Never  Vaping Use   Vaping status: Former   Quit date: 12/07/2016  Substance and Sexual Activity   Alcohol use: Yes    Alcohol/week: 0.0 standard drinks of alcohol    Comment: ocassional beer once or twice a week   Drug use: No    Comment: former cocaine and marijuana use sober 3 years   Sexual activity: Not on file  Other Topics Concern   Not on file  Social History Narrative   Not on file   Social Drivers of Health   Financial Resource Strain: Not on file  Food Insecurity: Not on file  Transportation Needs: Not on file  Physical Activity: Not on file  Stress: Not on file  Social Connections: Not on file    Allergies: Allergies  Allergen Reactions   Aspirin Anaphylaxis   Shellfish Allergy Anaphylaxis   Other Hives    RAISINS   Peanut-Containing Drug Products Hives   Lisinopril     UNSPECIFIED REACTION     Outpatient Meds: Current Outpatient Medications  Medication Sig Dispense Refill   albuterol  (VENTOLIN  HFA) 108 (90 Base) MCG/ACT inhaler Inhale 2 puffs into the lungs every 6 (six) hours as needed for wheezing or shortness of breath.     amLODipine  (NORVASC ) 2.5 MG tablet Take 2.5 mg by mouth daily.     ascorbic acid (VITAMIN C) 250 MG tablet Take 250 mg by  mouth daily.     atorvastatin (LIPITOR) 40 MG tablet Take 40 mg by mouth daily.     brimonidine (ALPHAGAN P) 0.1 % SOLN Place 1 drop into both eyes 2 (two) times daily.     Brinzolamide-Brimonidine 1-0.2 % SUSP Apply 1 drop to eye daily.     carboxymethylcellulose (REFRESH PLUS) 0.5 % SOLN Place 1 drop into both eyes 3 (three) times daily as needed (dry eyes).     Cholecalciferol 50 MCG (2000 UT) TABS Take 2,000 Units by mouth daily.     clopidogrel  (PLAVIX ) 75 MG tablet Take 75 mg by mouth daily.     escitalopram (LEXAPRO) 10 MG tablet Take 10 mg by mouth daily.     finasteride  (PROSCAR ) 5 MG tablet Take 5 mg by mouth daily.     fluticasone -salmeterol (  WIXELA INHUB) 500-50 MCG/ACT AEPB Inhale 1 puff into the lungs in the morning and at bedtime. 1 each 11   HYDROcodone -acetaminophen  (NORCO) 5-325 MG tablet Take 1 tablet by mouth every 6 (six) hours as needed for moderate pain. 5 tablet 0   isosorbide mononitrate (IMDUR) 30 MG 24 hr tablet Take 30 mg by mouth daily.     Latanoprostene Bunod 0.024 % SOLN Apply 1 drop to eye daily.     nitroGLYCERIN (NITROSTAT) 0.4 MG SL tablet Place 0.4 mg under the tongue every 5 (five) minutes as needed for chest pain.     omeprazole (PRILOSEC) 20 MG capsule Take 20 mg by mouth daily.     OXYGEN Inhale 3-4 L/min into the lungs as needed (short of breath).     prazosin (MINIPRESS) 1 MG capsule Take 1 mg by mouth at bedtime.     rosuvastatin (CRESTOR) 10 MG tablet Take 5 mg by mouth daily.     terazosin  (HYTRIN ) 5 MG capsule Take 5 mg by mouth at bedtime.     Tiotropium Bromide Monohydrate  (SPIRIVA  RESPIMAT) 2.5 MCG/ACT AERS Inhale 2 puffs into the lungs daily. 1 each 11   traZODone (DESYREL) 50 MG tablet Take 50 mg by mouth at bedtime as needed for sleep.     No current facility-administered medications for this visit.      ___________________________________________________________________ Objective   Exam:  BP (!) 140/84 (BP Location: Left Arm,  Patient Position: Sitting, Cuff Size: Normal)   Pulse 75   Ht 6' 2 (1.88 m)   Wt 276 lb 2 oz (125.2 kg)   SpO2 92%   PF (!) 2 L/min   BMI 35.45 kg/m  Wt Readings from Last 3 Encounters:  11/20/23 276 lb 2 oz (125.2 kg)  12/12/22 282 lb (127.9 kg)  09/18/22 280 lb 9.6 oz (127.3 kg)    General: Pleasant elderly man, mildly dyspneic at rest wearing supplemental oxygen.  Able to speak full sentences.  He becomes more dyspneic laying flat for exam Eyes: sclera anicteric, no redness ENT: oral mucosa moist without lesions, no cervical or supraclavicular lymphadenopathy CV: Regular without appreciable murmur, no JVD, no peripheral edema Resp: Globally decreased breath sounds bilaterally, no wheezing, normal RR and effort noted GI: soft, obese, no tenderness, with active bowel sounds. No guarding or palpable organomegaly noted. Skin; warm and dry, no rash or jaundice noted Neuro: awake, alert and oriented x 3. Normal gross motor function and fluent speech   Prior colonoscopy reports as noted above   Encounter Diagnoses  Name Primary?   Chronic constipation Yes   Hx of colonic polyps     Assessment and Plan Assessment & Plan Chronic constipation Managed with stool softeners and occasional laxatives. Epsom salts advised against due to variable magnesium content. - Recommended over-the-counter magnesium capsules at bedtime for consistent dosing. He also should [facility] with ongoing management of this.  Chronic obstructive pulmonary disease requiring supplemental oxygen COPD requires continuous supplemental oxygen. No further colonoscopies recommended due to increased sedation risks outweighing benefits based on age, COPD severity, and guidelines.  He was comfortable with that decision and I will see him again as needed.    Thank you for the courtesy of this consult.  Please call me with any questions or concerns.  Victory LITTIE Brand III  CC:  Referring provider noted above
